# Patient Record
Sex: Female | Born: 1935 | Race: White | Hispanic: No | Smoking: Never smoker
Health system: Southern US, Community
[De-identification: ages and names within clinical notes are randomized; demographics above are authoritative.]

## PROBLEM LIST (undated history)

## (undated) DIAGNOSIS — I3139 Other pericardial effusion (noninflammatory): Secondary | ICD-10-CM

## (undated) DIAGNOSIS — D649 Anemia, unspecified: Secondary | ICD-10-CM

## (undated) DIAGNOSIS — M069 Rheumatoid arthritis, unspecified: Secondary | ICD-10-CM

## (undated) DIAGNOSIS — Z9289 Personal history of other medical treatment: Secondary | ICD-10-CM

## (undated) DIAGNOSIS — I509 Heart failure, unspecified: Secondary | ICD-10-CM

## (undated) DIAGNOSIS — J189 Pneumonia, unspecified organism: Secondary | ICD-10-CM

## (undated) DIAGNOSIS — Z86711 Personal history of pulmonary embolism: Secondary | ICD-10-CM

## (undated) DIAGNOSIS — K219 Gastro-esophageal reflux disease without esophagitis: Secondary | ICD-10-CM

## (undated) DIAGNOSIS — E039 Hypothyroidism, unspecified: Secondary | ICD-10-CM

## (undated) DIAGNOSIS — M109 Gout, unspecified: Secondary | ICD-10-CM

## (undated) DIAGNOSIS — N184 Chronic kidney disease, stage 4 (severe): Secondary | ICD-10-CM

## (undated) DIAGNOSIS — I10 Essential (primary) hypertension: Secondary | ICD-10-CM

## (undated) DIAGNOSIS — I313 Pericardial effusion (noninflammatory): Secondary | ICD-10-CM

## (undated) HISTORY — PX: APPENDECTOMY: SHX54

## (undated) HISTORY — PX: DILATION AND CURETTAGE OF UTERUS: SHX78

## (undated) HISTORY — PX: JOINT REPLACEMENT: SHX530

## (undated) HISTORY — DX: Essential (primary) hypertension: I10

## (undated) HISTORY — PX: ABDOMINAL HYSTERECTOMY: SHX81

## (undated) HISTORY — PX: COLONOSCOPY: SHX174

## (undated) HISTORY — PX: FRACTURE SURGERY: SHX138

## (undated) HISTORY — DX: Gout, unspecified: M10.9

## (undated) HISTORY — DX: Anemia, unspecified: D64.9

---

## 1993-11-30 HISTORY — PX: TOTAL HIP ARTHROPLASTY: SHX124

## 2000-01-07 ENCOUNTER — Ambulatory Visit (HOSPITAL_COMMUNITY): Admission: RE | Admit: 2000-01-07 | Discharge: 2000-01-07 | Payer: Self-pay | Admitting: *Deleted

## 2000-01-07 ENCOUNTER — Encounter (INDEPENDENT_AMBULATORY_CARE_PROVIDER_SITE_OTHER): Payer: Self-pay | Admitting: Specialist

## 2000-04-01 ENCOUNTER — Ambulatory Visit (HOSPITAL_COMMUNITY): Admission: RE | Admit: 2000-04-01 | Discharge: 2000-04-01 | Payer: Self-pay | Admitting: *Deleted

## 2000-04-05 ENCOUNTER — Encounter: Admission: RE | Admit: 2000-04-05 | Discharge: 2000-04-05 | Payer: Self-pay | Admitting: Obstetrics & Gynecology

## 2000-04-05 ENCOUNTER — Encounter: Payer: Self-pay | Admitting: Obstetrics & Gynecology

## 2001-04-11 ENCOUNTER — Encounter: Admission: RE | Admit: 2001-04-11 | Discharge: 2001-04-11 | Payer: Self-pay | Admitting: Obstetrics & Gynecology

## 2001-04-11 ENCOUNTER — Encounter: Payer: Self-pay | Admitting: Obstetrics & Gynecology

## 2002-05-01 ENCOUNTER — Encounter: Payer: Self-pay | Admitting: Family Medicine

## 2002-05-01 ENCOUNTER — Encounter: Admission: RE | Admit: 2002-05-01 | Discharge: 2002-05-01 | Payer: Self-pay | Admitting: Family Medicine

## 2003-02-24 ENCOUNTER — Encounter: Admission: RE | Admit: 2003-02-24 | Discharge: 2003-02-24 | Payer: Self-pay | Admitting: Internal Medicine

## 2003-02-24 ENCOUNTER — Encounter: Payer: Self-pay | Admitting: Internal Medicine

## 2003-05-07 ENCOUNTER — Encounter: Admission: RE | Admit: 2003-05-07 | Discharge: 2003-05-07 | Payer: Self-pay | Admitting: Internal Medicine

## 2003-05-07 ENCOUNTER — Encounter: Payer: Self-pay | Admitting: Internal Medicine

## 2003-06-20 ENCOUNTER — Encounter (INDEPENDENT_AMBULATORY_CARE_PROVIDER_SITE_OTHER): Payer: Self-pay | Admitting: Specialist

## 2003-06-20 ENCOUNTER — Ambulatory Visit (HOSPITAL_COMMUNITY): Admission: RE | Admit: 2003-06-20 | Discharge: 2003-06-20 | Payer: Self-pay | Admitting: *Deleted

## 2004-08-05 ENCOUNTER — Encounter: Admission: RE | Admit: 2004-08-05 | Discharge: 2004-08-05 | Payer: Self-pay | Admitting: Family Medicine

## 2006-09-17 ENCOUNTER — Encounter: Admission: RE | Admit: 2006-09-17 | Discharge: 2006-09-17 | Payer: Self-pay | Admitting: Specialist

## 2008-05-01 ENCOUNTER — Encounter (INDEPENDENT_AMBULATORY_CARE_PROVIDER_SITE_OTHER): Payer: Self-pay | Admitting: Interventional Radiology

## 2008-05-01 ENCOUNTER — Ambulatory Visit (HOSPITAL_COMMUNITY): Admission: RE | Admit: 2008-05-01 | Discharge: 2008-05-02 | Payer: Self-pay | Admitting: Nephrology

## 2008-05-29 ENCOUNTER — Encounter (INDEPENDENT_AMBULATORY_CARE_PROVIDER_SITE_OTHER): Payer: Self-pay | Admitting: Oncology

## 2008-05-29 ENCOUNTER — Other Ambulatory Visit: Admission: RE | Admit: 2008-05-29 | Discharge: 2008-05-29 | Payer: Self-pay | Admitting: Oncology

## 2009-06-11 ENCOUNTER — Ambulatory Visit: Payer: Self-pay | Admitting: Vascular Surgery

## 2009-06-27 ENCOUNTER — Ambulatory Visit (HOSPITAL_COMMUNITY): Admission: RE | Admit: 2009-06-27 | Discharge: 2009-06-27 | Payer: Self-pay | Admitting: Vascular Surgery

## 2009-06-27 ENCOUNTER — Ambulatory Visit: Payer: Self-pay | Admitting: Vascular Surgery

## 2009-06-27 HISTORY — PX: AV FISTULA PLACEMENT: SHX1204

## 2009-08-13 ENCOUNTER — Ambulatory Visit: Payer: Self-pay | Admitting: Vascular Surgery

## 2010-11-30 HISTORY — PX: CATARACT EXTRACTION W/ INTRAOCULAR LENS  IMPLANT, BILATERAL: SHX1307

## 2010-12-21 ENCOUNTER — Encounter: Payer: Self-pay | Admitting: Nephrology

## 2010-12-21 ENCOUNTER — Encounter: Payer: Self-pay | Admitting: Family Medicine

## 2011-03-08 LAB — POCT I-STAT 4, (NA,K, GLUC, HGB,HCT)
Glucose, Bld: 91 mg/dL (ref 70–99)
HCT: 36 % (ref 36.0–46.0)

## 2011-04-14 NOTE — Assessment & Plan Note (Signed)
OFFICE VISIT   Kerkman, Yachet R  DOB:  [birth year]                                       08/13/2009  EAVWU#:[id number]   The patient returns for initial followup regarding her AV fistula in the  right upper arm which I created on 07/29.  She has a Building services engineer upper arm  fistula which has an excellent pulse and palpable thrill and no evidence  of steal distally.  This should be an excellent access for dialysis if  this becomes necessary since she has never had dialysis in the past.  She will return to see [place] on a p.r.n. basis.   Quita Skye Hart Rochester, M.D.  Electronically Signed   JDL/MEDQ  D:  08/13/2009  T:  08/14/2009  Job:  [id number]   cc:   Aram Beecham B. Eliott Nine, M.D.

## 2011-04-14 NOTE — Discharge Summary (Signed)
NAME:  Rachel Johns, Rachel Johns                  ACCOUNT NO.:  [id number]   MEDICAL RECORD NO.:  [id number]          PATIENT TYPE:  OIB   LOCATION:  6704                         FACILITY:  [facility]   PHYSICIAN:  Aram Beecham B. Eliott Nine, M.D.DATE OF BIRTH:  Nov 01, 1934   DATE OF ADMISSION:  05/01/2008  DATE OF DISCHARGE:                               DISCHARGE SUMMARY   ADMISSION DIAGNOSES:  1. Nephrotic-range proteinuria with active urine sediment and renal      insufficiency.  2. Anemia.  3. Thrombocytopenia.  4. Rheumatoid arthritis on methotrexate.  5. Gout.  6. Hypothyroidism, replaced.  7. Hypertension.   DISCHARGE DIAGNOSES:  1. Nephrotic-range proteinuria with active urine sediment and renal      insufficiency.  2. Anemia.  3. Thrombocytopenia.  4. Rheumatoid arthritis on methotrexate.  5. Gout.  6. Hypothyroidism, replaced.  7. Hypertension.   OPERATIONS AND PROCEDURES:  Left percutaneous renal biopsy by  Interventional Radiology.   BRIEF HISTORY:  This is a 77 year old white female with a history of  seropositive rheumatoid arthritis,  hypertension, and gout, newly  diagnosed. __________ admitted for __________ with prior negative  __________ .   Past medical history, family and social history, review of systems all  as dictated in admission.   During admission, physical was notable postbiopsy for a blood pressure  180/100, she was afebrile.  Lungs, clear to auscultation.  Normal  cardiac __________ .  Gauze noted over her percutaneous biopsy site.  __________   PERTINENT LABS:  Prior to the biopsy included a hemoglobin of 9.5 and  platelets 95,000.  Normal PT/PTT and bleeding time. Serum creatinine of  1.57.  A 24-hour urine protein was 4.6 g.   HOSPITAL COURSE:  The patient was admitted for observation following the  left percutaneous renal biopsy by Interventional Radiology.  She  experienced no issues with __________.  Hemoglobin prebiopsy was 9.5.  Serial hemoglobin  confirmation postbiopsy was 9.5 and 9.6 and  with  stability in hemoglobin, the patient is to be discharged today, first  day post biopsy, and will see her for followup in my office in a week to  10 days.   She will continue her usual medications with the addition of clonidine,  which was added during hospitalization for hypertension.  Her low  platelet count, which ranged between 77,000 and 85,000 will be addressed  by her rheumatologist, Dr. Jimmy Footman (query overdose of methotrexate).  __________   The patient was discharged in stable condition.   DISCHARGE INSTRUCTIONS:  1. Continue all home medications and in addition start clonidine 0.1      mg per daily for hypertension.  2. Call, if severe back or flank pain, fever greater than 101, or      grossly bloody urine.  3. No aspirin or aspirin-containing products, Motrin, Advil, Aleve,      etc., for 2 weeks.  4. No heavy lifting over 10 pounds for 2 weeks.  5. CBC will be arranged for 1 week at [facility] and followup      appointment with Dr. Eliott Nine in about 10 days  or so when the final      biopsy results are back.           ______________________________  Duke Salvia. Eliott Nine, M.D.     CBD/MEDQ  D:  05/02/2008  T:  05/02/2008  Job:  [id number]   cc:   Desmond Dike, MD  [place]in[place] Kirby Funk, MD  Lemmie Evens, M.D.

## 2011-04-14 NOTE — Op Note (Signed)
NAME:  Rachel Johns, Rachel Johns                  ACCOUNT NO.:  [id number]   MEDICAL RECORD NO.:  [id number]          PATIENT TYPE:  AMB   LOCATION:  SDS                          FACILITY:  [facility]   PHYSICIAN:  Quita Skye. Hart Rochester, M.D.  DATE OF BIRTH:  November 01, 1934   DATE OF PROCEDURE:  06/27/2009  DATE OF DISCHARGE:  06/27/2009                               OPERATIVE REPORT   PREOPERATIVE DIAGNOSIS:  End-stage renal disease.   POSTOPERATIVE DIAGNOSIS:  End-stage renal disease.   OPERATION:  Creation of a right upper arm brachial artery to cephalic  vein arteriovenous fistula (Kauffman shunt).   SURGEON:  Quita Skye. Hart Rochester, MD   FIRST ASSISTANT:  Della Goo, PA-C   ANESTHESIA:  Local.   PROCEDURE:  The patient was taken to the operating room and placed in  the supine position at which time right upper extremity was prepped with  Betadine scrub and solution and draped in routine sterile manner.  After  infiltration of 1% Xylocaine with epinephrine, transverse incision was  made in the antecubital area, antecubital vein was dissected free.  Cephalic branch was an excellent vein being about 3.5 mm in size.  It  was ligated distally and transected.  Artery was then exposed beneath  the fascia and encircled with vessel loops.  It had an excellent pulse.  The artery was occluded proximally and distally, opened with 15 blade,  and extended with Potts scissors.  The vein was carefully measured and  spatulated and anastomosed end-to-side with 6-0 Prolene.  Following  this, vessel loops were released.  There was an excellent pulse and  palpable thrill up to the proximal upper arm.  There was also a palpable  radial pulse with the fistula open.  Adequate hemostasis was achieved,  and the wound was closed in layers with Vicryl in a subcuticular  fashion.  Sterile dressing applied.  The patient was taken to recovery  room in satisfactory condition.      Quita Skye Hart Rochester, M.D.  Electronically  Signed     JDL/MEDQ  D:  06/27/2009  T:  06/27/2009  Job:  [id number]

## 2011-04-14 NOTE — Consult Note (Signed)
NEW PATIENT CONSULTATION   Paye, Amilah R  DOB:  09-01-1934                                       06/11/2009  NFAOZ#:[id number]   The patient is a 77 year old female with chronic renal insufficiency  secondary to sclerosing IgA nephropathy.  She has never been on dialysis  in the past but is being evaluated today for vascular access for  hemodialysis in the future.  She does have hypertension as well as  anemia and gout and rheumatoid arthritis by history.  She is right-  handed.  She does not take any anticoagulants.   On exam today her blood pressure is 154/84, heart rate is 80,  respirations 14.  Upper extremity exam reveals 3+ brachial and radial  pulses bilaterally.  She does have easy bruisability.  Cephalic vein on  the right is larger than the left by vein mapping and this was confirmed  with a SonoSite exam by me at the bedside.  Her forearm vein is  borderline.   I think the best plan would be a right upper arm AV fistula Soyla Murphy).  I have scheduled that for Thursday, July 29 at [facility] as an  outpatient.  Hopefully this will provide a satisfactory site for  vascular access for this nice lady.   Quita Skye Hart Rochester, M.D.  Electronically Signed   JDL/MEDQ  D:  06/11/2009  T:  06/12/2009  Job:  [id number]   cc:   Aram Beecham B. Eliott Nine, M.D.

## 2011-04-14 NOTE — H&P (Signed)
NAME:  Rachel Johns, Rachel Johns                  ACCOUNT NO.:  [id number]   MEDICAL RECORD NO.:  [id number]          PATIENT TYPE:  OIB   LOCATION:  6704                         FACILITY:  [facility]   PHYSICIAN:  Aram Beecham B. Eliott Nine, M.D.DATE OF BIRTH:  Sep 18, 1934   DATE OF ADMISSION:  05/01/2008  DATE OF DISCHARGE:                              HISTORY & PHYSICAL   Rachel Johns is a 77 year old white female, who has a long history of  seropositive rheumatoid arthritis (followed by Rachel Johns),  longstanding hypertension, newly diagnosed gout, hypothyroidism, and  anemia.  Currently on Aranesp (followed by Rachel Johns in  [place] for this).  Rachel was referred by Rachel primary physician Dr.  Sheria Johns, who noted an increase in serum creatinine from 1.4-1.7, which  occurred over the period of time since May 2007.  I saw Rachel on Apr 02, 2008, in the [place] office.  Rachel had a urinalysis, which showed 3+  protein with 40-50 RBCs.  24-hour urine protein was 4.6 g per 24 hours.  ANA, ANCA, hepatitis B and C, cryoglobulins, SPEP and UPEP were all  negative.  The patient was admitted for renal biopsy, which was done  earlier today by Interventional Radiology, to be monitored post biopsy.   Rachel has had issues since Rachel initial assessment in May with  hyperuricemia and the gouty arthritis involving Rachel right foot.  Dr.  Jimmy Johns has managed this with parenteral Kenalog as well as a trial of  oral prednisone, which Rachel stopped after the third day because of nausea  and vomiting.  Rachel is not on colchicine and has not yet started Uloric,  although this is the eventual plan to be done as an outpatient once Rachel  acute flare subsides.   PAST MEDICAL HISTORY:  1. Rheumatoid arthritis, on weekly oral methotrexate.  2. Gout with hyperuricemia, not yet on therapy.  3. Hypertension, longstanding.  4. Anemia, on Aranesp per Rachel Johns in [place].  5. Hypothyroidism, on replacement.  6. History of multinodular  goiter.  7. Pernicious anemia, on chronic B12 therapy.  8. Vitamin D deficiency.  9. History of cataracts.  10.Remote history of pulmonary embolus, possibly related to oral      contraceptive agents.  11.History of Barrett esophagus.  12.Allergic rhinitis.  13.History of mild spinal stenosis.  14.Total hip replacement, 1995.  15.History of chest discomfort with negative cardiac stress test in      2004 and 2009.  16.Status post hysterectomy in the past.  17.Small bilateral cataracts without substantial visual impairment.   CURRENT MEDICATIONS:  1. Alendronate 70 mg once a week.  2. Levothyroxine 75 mcg once a day.  3. Caltrate with vitamin D once a day.  4. Fexofenadine 180 mg per day.  5. Methotrexate 2.5 mg four tablets every Sunday.  6. Prilosec 20 mg daily.  7. Folic acid 1 mg daily.  8. Avapro 150 mg daily.  9. Iron once a day.  10.Oxycodone/APAP 5/325 mg one-half tablet as needed.   FAMILY HISTORY:  Positive for cardiac disease - Rachel Johns died of an MI in  his 17s, also positive history for hypertension and Rachel Johns,  who had lung cancer.   SOCIAL HISTORY:  The patient has been widowed for 4 years.  Rachel was  married prior to that for 47-1/2 years.  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  Rachel  has one Johns, who is very supportive, who lives near Rachel.  Rachel does not  smoke cigarettes and since Rachel has been taking methotrexate for Rachel  rheumatoid disease, has not used any alcohol, but occasionally prior to  that would enjoy a glass of wine.   REVIEW OF SYSTEMS:  Positive for gout pain, involving Rachel right foot for  about 2 weeks.  Rachel developed occasional shortness of breath with  exertion.  Rachel admits to nausea and vomiting with Rachel recent trial of  prednisone for gout.  Rachel denies chest pain, abdominal pain, and  swelling.  Currently, post biopsy, Rachel is not having any back pain, or  flank pain.  No  dizziness or lightheadedness and Rachel has not had any  rash or neurologic symptoms.   PHYSICAL EXAMINATION:  GENERAL:  (seen post percutaneous biopsy) reveals  a very pleasant older white female, in no acute distress.  VITAL SIGNS:  Blood pressure supine was 180/100.  Rachel was afebrile.  HEENT:  Normocephalic.  No evidence for trauma.  No neck vein  distention.  LUNGS:  Clear to auscultation and percussion.  CARDIAC:  Rhythm was regular.  S1, S2.  No audible S3 and no murmur.  Rachel has a Band-Aid on the left flank at Rachel biopsy site with a small  bruise, but no large hematoma.  ABDOMEN:  Soft and nontender.  There is no edema of the lower  extremities.  Rachel had pain with range of motion in Rachel right foot, but  the foot itself did not look hot or red, although there was some  tenderness.   PRE-BIOPSY LABS:  PT and PTT normal.  Creatinine 1.57.  Hemoglobin 10.6  (Apr 02, 2008), hematocrit 31.5, 24-hour urine protein 4.67 g/24 hours.  SPEP and UPEP negative.  ANA, ANCA, and cryoglobulins, all negative.   IMPRESSION:  77 year old female with longstanding history of rheumatoid  disease, gout, and hypertension who has;  1. Chronic kidney disease, stage III with some progression, nephrotic      range proteinuria and a nephritic sediment who is admitted status      post percutaneous renal biopsy for diagnostic purposes.  We will      follow up on a CBC this evening and in the morning.  If there is no      significant change in Rachel hemoglobin, Rachel can be discharged      tomorrow after lunch.  Blood pressure is elevated and Rachel is on      Avapro, which we will continue and will also use p.r.n. clonidine      to try and keep pressures down post biopsy to reduce the risk of      bleeding.  If Rachel blood pressure does respond, we will discharge      home on clonidine, but will assess dosing over the next 18 hours.      If Rachel has no further issues then Rachel will need a CBC in a week and      follow up  with me in about 10 days.  2. Hypertension.  As discussed above.  3. Rheumatoid arthritis  and weekly methotrexate.  4. Hyperuricemia with gout.  We will start Uloric as an outpatient      along with some colchicine, but until Rachel is stable post biopsy.      We will not add any new medications.  5. Anemia, on Aranesp per Dr. Melvyn Neth.  6. Hypothyroidism, on replacement per Rachel Johns. please carbon copy      this to Dr. Desmond Dike and to Dr. Jonny Ruiz and some AST as well as to      our office [facility] after review of signature.           ______________________________  Duke Salvia Eliott Nine, M.D.     CBD/MEDQ  D:  05/01/2008  T:  05/02/2008  Job:  [id number]   cc:   Elicia Lamp, MD  Lemmie Evens, M.D.  [facility]

## 2011-04-14 NOTE — Procedures (Signed)
CEPHALIC VEIN MAPPING   INDICATION:  End stage renal disease.   HISTORY:  End stage renal disease   EXAM:   The right cephalic vein is compressible.   Diameter measurements range from 0.27 to 0.43.   The left cephalic vein  is  compressible.   Diameter measurements range from 0.23 to 0.25.   See attached worksheet for all measurements.   IMPRESSION:  Patent with bilateral cephalic veins which are of  acceptable diameter for use as a dialysis access site.   ___________________________________________  Quita Skye. Hart Rochester, M.D.   MG/MEDQ  D:  06/11/2009  T:  06/11/2009  Job:  [id number]

## 2011-04-17 NOTE — Procedures (Signed)
[facility]  Patient:    Rachel Johns, Rachel Johns                         MRN: [id number] Proc. Date: 04/01/00 Adm. Date:  [id number] Disc. Date: [id number] Attending:  Sabino Gasser CC:         Desmond Dike, M.D. - [address], [place], [place] [address]                           Procedure Report  PROCEDURE:  Flexible sigmoidoscopy.  ENDOSCOPIST:  Sabino Gasser, M.D.  INDICATIONS:  Colon cancer screening.  ANESTHESIA:  Additional Demerol 10 mg, Versed 1.5 mg given intravenously in divided dose.  DESCRIPTION OF PROCEDURE:  With the patient mildly sedated in the left lateral decubitus position, the Olympus videoscopic colonoscope was inserted in the rectum and passed under direct vision to 60.0 cm from the anal verge, at which point the prep became suboptimal, and therefore from this point the colonoscope was then slowly withdrawn, taking circumferential views of the entire colonic mucosa, visualized, photographing as we moved to the rectum, which appeared normal on direct and retroflexed view.  The endoscope was then straightened and withdrawn. The patients vital signs and pulse oximeter remained stable.  The patient tolerated the procedure well without apparent complications.  FINDINGS:  Essentially normal flexible sigmoidoscopy.  PLAN:  Will have the patient follow up with me as noted above in the endoscopy note, for a followup of her reflux changes. DD:  04/01/00 TD:  04/03/00 Job: [id number]

## 2011-04-17 NOTE — Procedures (Signed)
[facility]  Patient:    Rachel Johns, Rachel Johns                           MRN: [id number] Attending:  Sabino Gasser, M.D. CC:         Desmond Dike, M.D., [address] [place], [place]. [address]                           Procedure Report  PROCEDURE:  Upper endoscopy with biopsy.  INDICATION FOR PROCEDURE:  Atypical cells seen on biopsy three months ago. The patient now on daily Prevacid therapy.  ANESTHESIA:  Demerol 60 mg, versed 7 mg were given intravenously in divided dose.  DESCRIPTION OF PROCEDURE:  With the patient mildly sedated in the left lateral decubitus position, the Olympus videoscopic endoscope was inserted in the mouth and passed under direct vision through the esophagus. The distal esophagus was approached and again showed changes of reflux and/or Barrettes esophagus. This as photographed. There may have been in comparison to the previous endoscopy less erythema and less inflammatory changes and multiple biopsies where then again taken. Once accomplished, we advanced into the stomach which other than the hiatal hernia seen was unremarkable. We advanced to the antrum, duodenal bulb, and second portion of the duodenum all of which appeared normal. From this point, the endoscope was slowly withdrawn taking circumferential views of the entire duodenal mucosa after photographs were taken until the endoscope was pulled back into the stomach, placed in retroflexion to view the stomach from below and this was photographed as well. The endoscope was then straightened and pulled back from distal to proximal stomach taking circumferential views of the entire gastric antrum subsequently esophageal mucosa all of which appeared normal other than the changes of the distal esophagus. The patients vital signs and pulse oximeter remained stable. The patient tolerated the procedure well without apparent complications.  FINDINGS:  Changes of moderately severe  esophagitis above a hiatal hernia. Await the biopsy report. The patient will call me for results and follow-up with me as an outpatient. Proceed with flexible sigmoidoscopy as planned for routine colon cancer screening. Her stool Hemoccults were negative x 3. DD:  04/01/00 TD:  04/03/00 Job: [id number]

## 2011-04-17 NOTE — Op Note (Signed)
   NAME:  Rachel Johns, Rachel Johns                            ACCOUNT NO.:  [id number]   MEDICAL RECORD NO.:  [id number]                   PATIENT TYPE:  AMB   LOCATION:  ENDO                                 [facility]AC[facility]  [facility]   PHYSICIAN:  Georgiana Spinner, M.D.                 DATE OF BIRTH:  1933-12-14   DATE OF PROCEDURE:  DATE OF DISCHARGE:                                 OPERATIVE REPORT   PROCEDURE:  Colonoscopy.   INDICATIONS:  Colon cancer screening.   ANESTHESIA:  Demerol 100 mg, Versed 8 mg.   PROCEDURE:  With the patient mildly sedated in the left lateral decubitus  position, the Olympus videoscopic colonoscope was inserted into the rectum  and passed under direct vision to the cecum through a tortuous colon.  Cecum  identified by ileocecal valve and appendiceal orifice, both of which were  photographed.  From this point the colonoscope was slowly withdrawn, taking  circumferential views of the colonic mucosa visualized, stopping only to  take random biopsies along the way, appeared to be normal, possibly polypoid-  type tissue, until we reached the rectum, which appeared normal on direct  and retroflexed view.  The endoscope was straightened and withdrawn.  The  patient's vital signs and pulse oximetry remained stable.  The patient  tolerated the procedure well and without apparent complications.   FINDINGS:  Possibly internal hemorrhoids, otherwise an unremarkable  examination.   PLAN:  Await biopsy report.  The patient will call me for results and follow  up with me as an outpatient.                                               Georgiana Spinner, M.D.    GMO/MEDQ  D:  06/20/2003  T:  06/20/2003  Job:  [id number]

## 2011-08-27 LAB — HEPATIC FUNCTION PANEL
ALT: 9
Albumin: 2.8 — ABNORMAL LOW
Alkaline Phosphatase: 60
Total Bilirubin: 0.8
Total Protein: 4.8 — ABNORMAL LOW

## 2011-08-27 LAB — CBC
HCT: 27.8 — ABNORMAL LOW
Hemoglobin: 9.1 — ABNORMAL LOW
Hemoglobin: 9.5 — ABNORMAL LOW
Hemoglobin: 9.5 — ABNORMAL LOW
MCHC: 34.1
MCHC: 34.6
MCV: 84
Platelets: 77 — ABNORMAL LOW
RBC: 3.31 — ABNORMAL LOW
RBC: 3.36 — ABNORMAL LOW
RDW: 15.3
RDW: 16.3 — ABNORMAL HIGH
WBC: 7.8

## 2011-08-27 LAB — DIFFERENTIAL
Basophils Absolute: 0
Basophils Relative: 1
Basophils Relative: 1
Eosinophils Absolute: 0.2
Eosinophils Relative: 3
Monocytes Absolute: 0.3
Monocytes Absolute: 0.4
Monocytes Relative: 6
Neutro Abs: 3.5
Neutrophils Relative %: 68

## 2012-11-04 ENCOUNTER — Telehealth: Payer: Self-pay

## 2012-11-04 NOTE — Telephone Encounter (Signed)
Pt. Called to report working in her yard yesterday and "over-did it".  States she started having discomfort, swelling, and redness in right arm at fistula site.  Stated she went to her PCP, he recommended an antibiotic, and warm compresses.  Advised pt. To be evaluated at [facility].  States has never been on HD/ reports she has had improvement in her creatinine. (last rdg. 1.7)   Does state her arm feels better today.  Discussed w/ Dr. Imogene Burn.  Recommends an appt. To evaluate the AVF site.  Stated no vascular studies needed initially.

## 2012-11-07 ENCOUNTER — Encounter: Payer: Self-pay | Admitting: Vascular Surgery

## 2012-11-08 ENCOUNTER — Encounter: Payer: Self-pay | Admitting: Vascular Surgery

## 2012-11-08 ENCOUNTER — Ambulatory Visit (INDEPENDENT_AMBULATORY_CARE_PROVIDER_SITE_OTHER): Payer: Medicare Other | Admitting: Vascular Surgery

## 2012-11-08 VITALS — BP 138/69 | HR 77 | Temp 98.3°F | Resp 16 | Ht 67.0 in | Wt 150.0 lb

## 2012-11-08 DIAGNOSIS — N184 Chronic kidney disease, stage 4 (severe): Secondary | ICD-10-CM | POA: Insufficient documentation

## 2012-11-08 DIAGNOSIS — T82898A Other specified complication of vascular prosthetic devices, implants and grafts, initial encounter: Secondary | ICD-10-CM

## 2012-11-08 NOTE — Progress Notes (Signed)
Subjective:     Patient ID: Rachel Johns, female   DOB: [birth year], 78 y.o.   MRN: [id number]  HPI this 78 year old female returns for evaluation of a right brachial-cephalic AV fistula which I created over 3 years ago. She has a sclerosing IgA nephropathy followed by Dr. Camille Bal. Her renal function has plateaued and remained quite stable with most recent creatinine of 1.8. Unfortunately she thrombosed her fistula at least one week ago. She was started on some antibiotics by her medical doctor because of some erythema surrounding this. She had had no chills and fever. She now returns for evaluation of the fistula. She denies any pain or numbness in the right hand and continues to have no chills and fever and denies pain in the fistula. The fistula was never used.  Past Medical History  Diagnosis Date  . Chronic kidney disease   . Hypertension   . Anemia   . Arthritis   . Gout     History  Substance Use Topics  . Smoking status: Never Smoker   . Smokeless tobacco: Never Used  . Alcohol Use: No    Family History  Problem Relation Age of Onset  . Heart disease Mother   . Heart attack Father   . Cancer Sister   . Other Sister     varicose veins  . Diabetes Brother   . Heart attack Daughter     No Known Allergies  Current outpatient prescriptions:acetaminophen (TYLENOL) 500 MG tablet, Take 500 mg by mouth every 6 (six) hours as needed., Disp: , Rfl: ;  allopurinol (ZYLOPRIM) 100 MG tablet, Take 1 tablet by mouth daily., Disp: , Rfl: ;  amLODipine (NORVASC) 5 MG tablet, Take 1 tablet by mouth daily., Disp: , Rfl: ;  calcitRIOL (ROCALTROL) 0.25 MCG capsule, Take 1 capsule by mouth 2 (two) times a week. Monday and Friday, Disp: , Rfl:  cholecalciferol (VITAMIN D) 1000 UNITS tablet, Take 1,000 Units by mouth daily., Disp: , Rfl: ;  cloNIDine (CATAPRES) 0.2 MG tablet, Take 1 tablet by mouth 2 (two) times daily., Disp: , Rfl: ;  folic acid (FOLVITE) 1 MG tablet, Take 1 mg by mouth  daily., Disp: , Rfl: ;  furosemide (LASIX) 40 MG tablet, Take 1 tablet by mouth daily., Disp: , Rfl:  hydroxychloroquine (PLAQUENIL) 200 MG tablet, Take 1 tablet by mouth 2 (two) times daily., Disp: , Rfl: ;  Lansoprazole (PREVACID PO), Take 15 mg by mouth daily., Disp: , Rfl: ;  levothyroxine (SYNTHROID, LEVOTHROID) 75 MCG tablet, Take 1 tablet by mouth daily., Disp: , Rfl: ;  losartan (COZAAR) 50 MG tablet, Take 1 tablet by mouth daily., Disp: , Rfl: ;  methylPREDNISolone (MEDROL) 4 MG tablet, Take 1 tablet by mouth daily., Disp: , Rfl:  sodium bicarbonate 650 MG tablet, Take 650 mg by mouth 3 (three) times daily., Disp: , Rfl: ;  traMADol (ULTRAM) 50 MG tablet, Take 50 mg by mouth every 6 (six) hours as needed., Disp: , Rfl:   BP 138/69  Pulse 77  Temp 98.3 F (36.8 C) (Oral)  Resp 16  Ht 5\' 7"  (1.702 m)  Wt 150 lb (68.04 kg)  BMI 23.49 kg/m2  SpO2 100%  Body mass index is 23.49 kg/(m^2).           Review of Systems denies chest pain, dyspnea on exertion, PND, orthopnea, claudication. All systems negative and complete review of system     Objective:   Physical Exam blood pressure 130/69 heart  rate 77 respirations 18 temperature 98.3 General well-developed well-nourished female in no apparent stress alert and oriented x3 Lungs no rhonchi or wheezing Right upper extremity with thrombosed brachial-cephalic AV fistula. 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 This is firm and thrombosed. There is no erythema surrounding it no fluctuance. The remainder of the vein is patent. 3+ radial pulse palpable distally. Right hand is warm and well perfused.     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     Plan:     Discussed situation with Dr. Eliott Nine. We do not feel replacing this portion of the fistula with a Gore-Tex graft would be in her best interest with her and no compromised state and the fact that she has not required dialysis.  Will not proceed with any further access at this time unless patient requires this in the future If thrombosed aneurysm the fistula becomes red and tender will perform surgical procedure to evacuate this thrombus. Offered this the patient currently but she does not want to proceed at the present time. Return to see [place] on when necessary basis

## 2014-12-25 DIAGNOSIS — Z961 Presence of intraocular lens: Secondary | ICD-10-CM | POA: Diagnosis not present

## 2015-01-02 DIAGNOSIS — D631 Anemia in chronic kidney disease: Secondary | ICD-10-CM | POA: Diagnosis not present

## 2015-01-02 DIAGNOSIS — N189 Chronic kidney disease, unspecified: Secondary | ICD-10-CM | POA: Diagnosis not present

## 2015-02-01 DIAGNOSIS — E213 Hyperparathyroidism, unspecified: Secondary | ICD-10-CM | POA: Diagnosis not present

## 2015-02-01 DIAGNOSIS — B9689 Other specified bacterial agents as the cause of diseases classified elsewhere: Secondary | ICD-10-CM | POA: Diagnosis not present

## 2015-02-01 DIAGNOSIS — K2270 Barrett's esophagus without dysplasia: Secondary | ICD-10-CM | POA: Diagnosis not present

## 2015-02-01 DIAGNOSIS — J019 Acute sinusitis, unspecified: Secondary | ICD-10-CM | POA: Diagnosis not present

## 2015-02-01 DIAGNOSIS — D510 Vitamin B12 deficiency anemia due to intrinsic factor deficiency: Secondary | ICD-10-CM | POA: Diagnosis not present

## 2015-02-13 DIAGNOSIS — D631 Anemia in chronic kidney disease: Secondary | ICD-10-CM | POA: Diagnosis not present

## 2015-02-13 DIAGNOSIS — N189 Chronic kidney disease, unspecified: Secondary | ICD-10-CM | POA: Diagnosis not present

## 2015-02-19 DIAGNOSIS — M0589 Other rheumatoid arthritis with rheumatoid factor of multiple sites: Secondary | ICD-10-CM | POA: Diagnosis not present

## 2015-02-19 DIAGNOSIS — M150 Primary generalized (osteo)arthritis: Secondary | ICD-10-CM | POA: Diagnosis not present

## 2015-02-19 DIAGNOSIS — M1A09X0 Idiopathic chronic gout, multiple sites, without tophus (tophi): Secondary | ICD-10-CM | POA: Diagnosis not present

## 2015-02-19 DIAGNOSIS — M810 Age-related osteoporosis without current pathological fracture: Secondary | ICD-10-CM | POA: Diagnosis not present

## 2015-03-07 DIAGNOSIS — Z1231 Encounter for screening mammogram for malignant neoplasm of breast: Secondary | ICD-10-CM | POA: Diagnosis not present

## 2015-03-27 DIAGNOSIS — D631 Anemia in chronic kidney disease: Secondary | ICD-10-CM | POA: Diagnosis not present

## 2015-03-27 DIAGNOSIS — N189 Chronic kidney disease, unspecified: Secondary | ICD-10-CM | POA: Diagnosis not present

## 2015-04-25 DIAGNOSIS — D631 Anemia in chronic kidney disease: Secondary | ICD-10-CM | POA: Diagnosis not present

## 2015-04-25 DIAGNOSIS — N189 Chronic kidney disease, unspecified: Secondary | ICD-10-CM | POA: Diagnosis not present

## 2015-05-08 DIAGNOSIS — M109 Gout, unspecified: Secondary | ICD-10-CM | POA: Diagnosis not present

## 2015-05-08 DIAGNOSIS — N184 Chronic kidney disease, stage 4 (severe): Secondary | ICD-10-CM | POA: Diagnosis not present

## 2015-05-08 DIAGNOSIS — D631 Anemia in chronic kidney disease: Secondary | ICD-10-CM | POA: Diagnosis not present

## 2015-05-08 DIAGNOSIS — N028 Recurrent and persistent hematuria with other morphologic changes: Secondary | ICD-10-CM | POA: Diagnosis not present

## 2015-05-08 DIAGNOSIS — N2581 Secondary hyperparathyroidism of renal origin: Secondary | ICD-10-CM | POA: Diagnosis not present

## 2015-05-08 DIAGNOSIS — I129 Hypertensive chronic kidney disease with stage 1 through stage 4 chronic kidney disease, or unspecified chronic kidney disease: Secondary | ICD-10-CM | POA: Diagnosis not present

## 2015-05-27 DIAGNOSIS — N189 Chronic kidney disease, unspecified: Secondary | ICD-10-CM | POA: Diagnosis not present

## 2015-05-27 DIAGNOSIS — D631 Anemia in chronic kidney disease: Secondary | ICD-10-CM | POA: Diagnosis not present

## 2015-06-18 DIAGNOSIS — M0589 Other rheumatoid arthritis with rheumatoid factor of multiple sites: Secondary | ICD-10-CM | POA: Diagnosis not present

## 2015-06-18 DIAGNOSIS — M1A09X0 Idiopathic chronic gout, multiple sites, without tophus (tophi): Secondary | ICD-10-CM | POA: Diagnosis not present

## 2015-06-18 DIAGNOSIS — M150 Primary generalized (osteo)arthritis: Secondary | ICD-10-CM | POA: Diagnosis not present

## 2015-06-18 DIAGNOSIS — M810 Age-related osteoporosis without current pathological fracture: Secondary | ICD-10-CM | POA: Diagnosis not present

## 2015-06-26 DIAGNOSIS — N189 Chronic kidney disease, unspecified: Secondary | ICD-10-CM | POA: Diagnosis not present

## 2015-06-26 DIAGNOSIS — D631 Anemia in chronic kidney disease: Secondary | ICD-10-CM | POA: Diagnosis not present

## 2015-06-28 DIAGNOSIS — D631 Anemia in chronic kidney disease: Secondary | ICD-10-CM | POA: Diagnosis not present

## 2015-06-28 DIAGNOSIS — N189 Chronic kidney disease, unspecified: Secondary | ICD-10-CM | POA: Diagnosis not present

## 2015-07-29 DIAGNOSIS — N189 Chronic kidney disease, unspecified: Secondary | ICD-10-CM | POA: Diagnosis not present

## 2015-07-29 DIAGNOSIS — D631 Anemia in chronic kidney disease: Secondary | ICD-10-CM | POA: Diagnosis not present

## 2015-08-07 DIAGNOSIS — D631 Anemia in chronic kidney disease: Secondary | ICD-10-CM | POA: Diagnosis not present

## 2015-08-07 DIAGNOSIS — N184 Chronic kidney disease, stage 4 (severe): Secondary | ICD-10-CM | POA: Diagnosis not present

## 2015-08-07 DIAGNOSIS — M109 Gout, unspecified: Secondary | ICD-10-CM | POA: Diagnosis not present

## 2015-08-07 DIAGNOSIS — N2581 Secondary hyperparathyroidism of renal origin: Secondary | ICD-10-CM | POA: Diagnosis not present

## 2015-08-07 DIAGNOSIS — I129 Hypertensive chronic kidney disease with stage 1 through stage 4 chronic kidney disease, or unspecified chronic kidney disease: Secondary | ICD-10-CM | POA: Diagnosis not present

## 2015-08-27 DIAGNOSIS — D631 Anemia in chronic kidney disease: Secondary | ICD-10-CM | POA: Diagnosis not present

## 2015-08-27 DIAGNOSIS — N189 Chronic kidney disease, unspecified: Secondary | ICD-10-CM | POA: Diagnosis not present

## 2015-09-16 DIAGNOSIS — Z23 Encounter for immunization: Secondary | ICD-10-CM | POA: Diagnosis not present

## 2015-09-16 DIAGNOSIS — N028 Recurrent and persistent hematuria with other morphologic changes: Secondary | ICD-10-CM | POA: Diagnosis not present

## 2015-09-16 DIAGNOSIS — N184 Chronic kidney disease, stage 4 (severe): Secondary | ICD-10-CM | POA: Diagnosis not present

## 2015-09-16 DIAGNOSIS — K2270 Barrett's esophagus without dysplasia: Secondary | ICD-10-CM | POA: Diagnosis not present

## 2015-09-16 DIAGNOSIS — E039 Hypothyroidism, unspecified: Secondary | ICD-10-CM | POA: Diagnosis not present

## 2015-09-16 DIAGNOSIS — Z0000 Encounter for general adult medical examination without abnormal findings: Secondary | ICD-10-CM | POA: Diagnosis not present

## 2015-09-26 DIAGNOSIS — N189 Chronic kidney disease, unspecified: Secondary | ICD-10-CM | POA: Diagnosis not present

## 2015-09-26 DIAGNOSIS — D631 Anemia in chronic kidney disease: Secondary | ICD-10-CM | POA: Diagnosis not present

## 2015-10-10 DIAGNOSIS — D649 Anemia, unspecified: Secondary | ICD-10-CM | POA: Diagnosis not present

## 2015-10-10 DIAGNOSIS — R140 Abdominal distension (gaseous): Secondary | ICD-10-CM | POA: Diagnosis not present

## 2015-10-10 DIAGNOSIS — M069 Rheumatoid arthritis, unspecified: Secondary | ICD-10-CM | POA: Diagnosis not present

## 2015-10-28 DIAGNOSIS — D631 Anemia in chronic kidney disease: Secondary | ICD-10-CM | POA: Diagnosis not present

## 2015-10-28 DIAGNOSIS — N189 Chronic kidney disease, unspecified: Secondary | ICD-10-CM | POA: Diagnosis not present

## 2015-11-11 DIAGNOSIS — M109 Gout, unspecified: Secondary | ICD-10-CM | POA: Diagnosis not present

## 2015-11-11 DIAGNOSIS — D631 Anemia in chronic kidney disease: Secondary | ICD-10-CM | POA: Diagnosis not present

## 2015-11-11 DIAGNOSIS — N2581 Secondary hyperparathyroidism of renal origin: Secondary | ICD-10-CM | POA: Diagnosis not present

## 2015-11-11 DIAGNOSIS — N184 Chronic kidney disease, stage 4 (severe): Secondary | ICD-10-CM | POA: Diagnosis not present

## 2015-11-11 DIAGNOSIS — I129 Hypertensive chronic kidney disease with stage 1 through stage 4 chronic kidney disease, or unspecified chronic kidney disease: Secondary | ICD-10-CM | POA: Diagnosis not present

## 2015-11-11 DIAGNOSIS — N028 Recurrent and persistent hematuria with other morphologic changes: Secondary | ICD-10-CM | POA: Diagnosis not present

## 2015-11-12 DIAGNOSIS — M150 Primary generalized (osteo)arthritis: Secondary | ICD-10-CM | POA: Diagnosis not present

## 2015-11-12 DIAGNOSIS — M1A09X0 Idiopathic chronic gout, multiple sites, without tophus (tophi): Secondary | ICD-10-CM | POA: Diagnosis not present

## 2015-11-12 DIAGNOSIS — M0589 Other rheumatoid arthritis with rheumatoid factor of multiple sites: Secondary | ICD-10-CM | POA: Diagnosis not present

## 2015-11-12 DIAGNOSIS — M810 Age-related osteoporosis without current pathological fracture: Secondary | ICD-10-CM | POA: Diagnosis not present

## 2015-11-26 DIAGNOSIS — D631 Anemia in chronic kidney disease: Secondary | ICD-10-CM | POA: Diagnosis not present

## 2015-11-26 DIAGNOSIS — N189 Chronic kidney disease, unspecified: Secondary | ICD-10-CM | POA: Diagnosis not present

## 2015-12-30 DIAGNOSIS — N189 Chronic kidney disease, unspecified: Secondary | ICD-10-CM | POA: Diagnosis not present

## 2015-12-30 DIAGNOSIS — D631 Anemia in chronic kidney disease: Secondary | ICD-10-CM | POA: Diagnosis not present

## 2016-01-27 DIAGNOSIS — N189 Chronic kidney disease, unspecified: Secondary | ICD-10-CM | POA: Diagnosis not present

## 2016-01-27 DIAGNOSIS — D631 Anemia in chronic kidney disease: Secondary | ICD-10-CM | POA: Diagnosis not present

## 2016-01-29 DIAGNOSIS — J0100 Acute maxillary sinusitis, unspecified: Secondary | ICD-10-CM | POA: Diagnosis not present

## 2016-02-10 DIAGNOSIS — M810 Age-related osteoporosis without current pathological fracture: Secondary | ICD-10-CM | POA: Diagnosis not present

## 2016-02-10 DIAGNOSIS — M150 Primary generalized (osteo)arthritis: Secondary | ICD-10-CM | POA: Diagnosis not present

## 2016-02-10 DIAGNOSIS — M1A09X0 Idiopathic chronic gout, multiple sites, without tophus (tophi): Secondary | ICD-10-CM | POA: Diagnosis not present

## 2016-02-10 DIAGNOSIS — M0589 Other rheumatoid arthritis with rheumatoid factor of multiple sites: Secondary | ICD-10-CM | POA: Diagnosis not present

## 2016-02-24 DIAGNOSIS — D631 Anemia in chronic kidney disease: Secondary | ICD-10-CM | POA: Diagnosis not present

## 2016-02-24 DIAGNOSIS — M25462 Effusion, left knee: Secondary | ICD-10-CM | POA: Diagnosis not present

## 2016-02-24 DIAGNOSIS — M1712 Unilateral primary osteoarthritis, left knee: Secondary | ICD-10-CM | POA: Diagnosis not present

## 2016-02-24 DIAGNOSIS — N189 Chronic kidney disease, unspecified: Secondary | ICD-10-CM | POA: Diagnosis not present

## 2016-03-03 DIAGNOSIS — M25462 Effusion, left knee: Secondary | ICD-10-CM | POA: Diagnosis not present

## 2016-03-03 DIAGNOSIS — S83282A Other tear of lateral meniscus, current injury, left knee, initial encounter: Secondary | ICD-10-CM | POA: Diagnosis not present

## 2016-03-03 DIAGNOSIS — M25562 Pain in left knee: Secondary | ICD-10-CM | POA: Diagnosis not present

## 2016-03-05 DIAGNOSIS — S83282A Other tear of lateral meniscus, current injury, left knee, initial encounter: Secondary | ICD-10-CM | POA: Diagnosis not present

## 2016-03-05 DIAGNOSIS — M1712 Unilateral primary osteoarthritis, left knee: Secondary | ICD-10-CM | POA: Diagnosis not present

## 2016-03-09 DIAGNOSIS — Z1231 Encounter for screening mammogram for malignant neoplasm of breast: Secondary | ICD-10-CM | POA: Diagnosis not present

## 2016-04-02 DIAGNOSIS — D631 Anemia in chronic kidney disease: Secondary | ICD-10-CM | POA: Diagnosis not present

## 2016-04-02 DIAGNOSIS — N189 Chronic kidney disease, unspecified: Secondary | ICD-10-CM | POA: Diagnosis not present

## 2016-04-03 DIAGNOSIS — N189 Chronic kidney disease, unspecified: Secondary | ICD-10-CM | POA: Diagnosis not present

## 2016-04-03 DIAGNOSIS — D631 Anemia in chronic kidney disease: Secondary | ICD-10-CM | POA: Diagnosis not present

## 2016-04-14 DIAGNOSIS — D631 Anemia in chronic kidney disease: Secondary | ICD-10-CM | POA: Diagnosis not present

## 2016-04-14 DIAGNOSIS — N184 Chronic kidney disease, stage 4 (severe): Secondary | ICD-10-CM | POA: Diagnosis not present

## 2016-04-14 DIAGNOSIS — N2581 Secondary hyperparathyroidism of renal origin: Secondary | ICD-10-CM | POA: Diagnosis not present

## 2016-04-14 DIAGNOSIS — M109 Gout, unspecified: Secondary | ICD-10-CM | POA: Diagnosis not present

## 2016-04-14 DIAGNOSIS — I129 Hypertensive chronic kidney disease with stage 1 through stage 4 chronic kidney disease, or unspecified chronic kidney disease: Secondary | ICD-10-CM | POA: Diagnosis not present

## 2016-05-01 DIAGNOSIS — D631 Anemia in chronic kidney disease: Secondary | ICD-10-CM | POA: Diagnosis not present

## 2016-05-01 DIAGNOSIS — N189 Chronic kidney disease, unspecified: Secondary | ICD-10-CM | POA: Diagnosis not present

## 2016-05-29 DIAGNOSIS — N189 Chronic kidney disease, unspecified: Secondary | ICD-10-CM | POA: Diagnosis not present

## 2016-05-29 DIAGNOSIS — D631 Anemia in chronic kidney disease: Secondary | ICD-10-CM | POA: Diagnosis not present

## 2016-06-18 DIAGNOSIS — M1A09X0 Idiopathic chronic gout, multiple sites, without tophus (tophi): Secondary | ICD-10-CM | POA: Diagnosis not present

## 2016-06-18 DIAGNOSIS — M810 Age-related osteoporosis without current pathological fracture: Secondary | ICD-10-CM | POA: Diagnosis not present

## 2016-06-18 DIAGNOSIS — M150 Primary generalized (osteo)arthritis: Secondary | ICD-10-CM | POA: Diagnosis not present

## 2016-06-18 DIAGNOSIS — M25562 Pain in left knee: Secondary | ICD-10-CM | POA: Diagnosis not present

## 2016-06-18 DIAGNOSIS — M0589 Other rheumatoid arthritis with rheumatoid factor of multiple sites: Secondary | ICD-10-CM | POA: Diagnosis not present

## 2016-06-26 DIAGNOSIS — D631 Anemia in chronic kidney disease: Secondary | ICD-10-CM | POA: Diagnosis not present

## 2016-06-26 DIAGNOSIS — N189 Chronic kidney disease, unspecified: Secondary | ICD-10-CM | POA: Diagnosis not present

## 2016-06-26 DIAGNOSIS — I10 Essential (primary) hypertension: Secondary | ICD-10-CM | POA: Diagnosis not present

## 2016-07-21 DIAGNOSIS — S52501A Unspecified fracture of the lower end of right radius, initial encounter for closed fracture: Secondary | ICD-10-CM | POA: Diagnosis not present

## 2016-07-30 DIAGNOSIS — D631 Anemia in chronic kidney disease: Secondary | ICD-10-CM | POA: Diagnosis not present

## 2016-07-30 DIAGNOSIS — N189 Chronic kidney disease, unspecified: Secondary | ICD-10-CM | POA: Diagnosis not present

## 2016-08-26 DIAGNOSIS — S52501A Unspecified fracture of the lower end of right radius, initial encounter for closed fracture: Secondary | ICD-10-CM | POA: Diagnosis not present

## 2016-08-28 DIAGNOSIS — N189 Chronic kidney disease, unspecified: Secondary | ICD-10-CM | POA: Diagnosis not present

## 2016-08-28 DIAGNOSIS — D631 Anemia in chronic kidney disease: Secondary | ICD-10-CM | POA: Diagnosis not present

## 2016-09-04 DIAGNOSIS — S52501A Unspecified fracture of the lower end of right radius, initial encounter for closed fracture: Secondary | ICD-10-CM | POA: Diagnosis not present

## 2016-09-09 DIAGNOSIS — Z23 Encounter for immunization: Secondary | ICD-10-CM | POA: Diagnosis not present

## 2016-09-14 DIAGNOSIS — I129 Hypertensive chronic kidney disease with stage 1 through stage 4 chronic kidney disease, or unspecified chronic kidney disease: Secondary | ICD-10-CM | POA: Diagnosis not present

## 2016-09-14 DIAGNOSIS — N2581 Secondary hyperparathyroidism of renal origin: Secondary | ICD-10-CM | POA: Diagnosis not present

## 2016-09-14 DIAGNOSIS — D631 Anemia in chronic kidney disease: Secondary | ICD-10-CM | POA: Diagnosis not present

## 2016-09-14 DIAGNOSIS — N184 Chronic kidney disease, stage 4 (severe): Secondary | ICD-10-CM | POA: Diagnosis not present

## 2016-09-14 DIAGNOSIS — M109 Gout, unspecified: Secondary | ICD-10-CM | POA: Diagnosis not present

## 2016-09-21 DIAGNOSIS — Z0000 Encounter for general adult medical examination without abnormal findings: Secondary | ICD-10-CM | POA: Diagnosis not present

## 2016-09-21 DIAGNOSIS — M059 Rheumatoid arthritis with rheumatoid factor, unspecified: Secondary | ICD-10-CM | POA: Diagnosis not present

## 2016-09-21 DIAGNOSIS — E039 Hypothyroidism, unspecified: Secondary | ICD-10-CM | POA: Diagnosis not present

## 2016-09-21 DIAGNOSIS — R42 Dizziness and giddiness: Secondary | ICD-10-CM | POA: Diagnosis not present

## 2016-09-21 DIAGNOSIS — Z79899 Other long term (current) drug therapy: Secondary | ICD-10-CM | POA: Diagnosis not present

## 2016-09-21 DIAGNOSIS — N028 Recurrent and persistent hematuria with other morphologic changes: Secondary | ICD-10-CM | POA: Diagnosis not present

## 2016-09-22 DIAGNOSIS — M810 Age-related osteoporosis without current pathological fracture: Secondary | ICD-10-CM | POA: Diagnosis not present

## 2016-09-22 DIAGNOSIS — M0589 Other rheumatoid arthritis with rheumatoid factor of multiple sites: Secondary | ICD-10-CM | POA: Diagnosis not present

## 2016-09-22 DIAGNOSIS — M25562 Pain in left knee: Secondary | ICD-10-CM | POA: Diagnosis not present

## 2016-09-22 DIAGNOSIS — M25561 Pain in right knee: Secondary | ICD-10-CM | POA: Diagnosis not present

## 2016-09-25 DIAGNOSIS — N189 Chronic kidney disease, unspecified: Secondary | ICD-10-CM | POA: Diagnosis not present

## 2016-09-25 DIAGNOSIS — D631 Anemia in chronic kidney disease: Secondary | ICD-10-CM | POA: Diagnosis not present

## 2016-09-28 DIAGNOSIS — N189 Chronic kidney disease, unspecified: Secondary | ICD-10-CM | POA: Diagnosis not present

## 2016-09-28 DIAGNOSIS — D631 Anemia in chronic kidney disease: Secondary | ICD-10-CM | POA: Diagnosis not present

## 2016-09-29 DIAGNOSIS — D631 Anemia in chronic kidney disease: Secondary | ICD-10-CM | POA: Diagnosis not present

## 2016-09-29 DIAGNOSIS — N189 Chronic kidney disease, unspecified: Secondary | ICD-10-CM | POA: Diagnosis not present

## 2016-10-13 DIAGNOSIS — S52501A Unspecified fracture of the lower end of right radius, initial encounter for closed fracture: Secondary | ICD-10-CM | POA: Diagnosis not present

## 2016-10-30 DIAGNOSIS — D631 Anemia in chronic kidney disease: Secondary | ICD-10-CM | POA: Diagnosis not present

## 2016-10-30 DIAGNOSIS — N189 Chronic kidney disease, unspecified: Secondary | ICD-10-CM | POA: Diagnosis not present

## 2016-11-11 DIAGNOSIS — J22 Unspecified acute lower respiratory infection: Secondary | ICD-10-CM | POA: Diagnosis not present

## 2016-11-11 DIAGNOSIS — H109 Unspecified conjunctivitis: Secondary | ICD-10-CM | POA: Diagnosis not present

## 2016-11-26 DIAGNOSIS — J209 Acute bronchitis, unspecified: Secondary | ICD-10-CM | POA: Diagnosis not present

## 2016-12-03 DIAGNOSIS — I120 Hypertensive chronic kidney disease with stage 5 chronic kidney disease or end stage renal disease: Secondary | ICD-10-CM | POA: Diagnosis not present

## 2016-12-03 DIAGNOSIS — R531 Weakness: Secondary | ICD-10-CM | POA: Diagnosis not present

## 2016-12-03 DIAGNOSIS — D631 Anemia in chronic kidney disease: Secondary | ICD-10-CM | POA: Diagnosis not present

## 2016-12-03 DIAGNOSIS — M069 Rheumatoid arthritis, unspecified: Secondary | ICD-10-CM | POA: Diagnosis not present

## 2016-12-03 DIAGNOSIS — J449 Chronic obstructive pulmonary disease, unspecified: Secondary | ICD-10-CM | POA: Diagnosis not present

## 2016-12-03 DIAGNOSIS — N186 End stage renal disease: Secondary | ICD-10-CM | POA: Diagnosis not present

## 2016-12-03 DIAGNOSIS — R05 Cough: Secondary | ICD-10-CM | POA: Diagnosis not present

## 2016-12-03 DIAGNOSIS — I313 Pericardial effusion (noninflammatory): Secondary | ICD-10-CM | POA: Diagnosis not present

## 2016-12-03 DIAGNOSIS — D72829 Elevated white blood cell count, unspecified: Secondary | ICD-10-CM | POA: Diagnosis not present

## 2016-12-03 DIAGNOSIS — R404 Transient alteration of awareness: Secondary | ICD-10-CM | POA: Diagnosis not present

## 2016-12-03 DIAGNOSIS — R0602 Shortness of breath: Secondary | ICD-10-CM | POA: Diagnosis not present

## 2016-12-03 DIAGNOSIS — J439 Emphysema, unspecified: Secondary | ICD-10-CM | POA: Diagnosis not present

## 2016-12-03 DIAGNOSIS — M0510 Rheumatoid lung disease with rheumatoid arthritis of unspecified site: Secondary | ICD-10-CM | POA: Diagnosis not present

## 2016-12-03 DIAGNOSIS — N179 Acute kidney failure, unspecified: Secondary | ICD-10-CM | POA: Diagnosis not present

## 2016-12-04 ENCOUNTER — Inpatient Hospital Stay (HOSPITAL_COMMUNITY)
Admission: AD | Admit: 2016-12-04 | Discharge: 2016-12-09 | DRG: 315 | Disposition: A | Discharge status: Home / Self Care | Payer: Medicare Other | Source: Other Acute Inpatient Hospital | Attending: Internal Medicine | Admitting: Internal Medicine

## 2016-12-04 ENCOUNTER — Encounter (HOSPITAL_COMMUNITY): Payer: Self-pay | Admitting: Family Medicine

## 2016-12-04 ENCOUNTER — Inpatient Hospital Stay (HOSPITAL_COMMUNITY): Payer: Medicare Other

## 2016-12-04 DIAGNOSIS — N179 Acute kidney failure, unspecified: Secondary | ICD-10-CM | POA: Diagnosis present

## 2016-12-04 DIAGNOSIS — Z8249 Family history of ischemic heart disease and other diseases of the circulatory system: Secondary | ICD-10-CM | POA: Diagnosis not present

## 2016-12-04 DIAGNOSIS — J441 Chronic obstructive pulmonary disease with (acute) exacerbation: Secondary | ICD-10-CM | POA: Diagnosis not present

## 2016-12-04 DIAGNOSIS — E872 Acidosis, unspecified: Secondary | ICD-10-CM | POA: Diagnosis present

## 2016-12-04 DIAGNOSIS — M109 Gout, unspecified: Secondary | ICD-10-CM | POA: Diagnosis not present

## 2016-12-04 DIAGNOSIS — I319 Disease of pericardium, unspecified: Secondary | ICD-10-CM | POA: Diagnosis not present

## 2016-12-04 DIAGNOSIS — D72829 Elevated white blood cell count, unspecified: Secondary | ICD-10-CM | POA: Diagnosis not present

## 2016-12-04 DIAGNOSIS — I313 Pericardial effusion (noninflammatory): Principal | ICD-10-CM | POA: Diagnosis present

## 2016-12-04 DIAGNOSIS — N19 Unspecified kidney failure: Secondary | ICD-10-CM | POA: Diagnosis present

## 2016-12-04 DIAGNOSIS — E440 Moderate protein-calorie malnutrition: Secondary | ICD-10-CM | POA: Diagnosis present

## 2016-12-04 DIAGNOSIS — Z7952 Long term (current) use of systemic steroids: Secondary | ICD-10-CM

## 2016-12-04 DIAGNOSIS — R0600 Dyspnea, unspecified: Secondary | ICD-10-CM | POA: Diagnosis not present

## 2016-12-04 DIAGNOSIS — M069 Rheumatoid arthritis, unspecified: Secondary | ICD-10-CM | POA: Diagnosis not present

## 2016-12-04 DIAGNOSIS — Z90710 Acquired absence of both cervix and uterus: Secondary | ICD-10-CM | POA: Diagnosis not present

## 2016-12-04 DIAGNOSIS — D638 Anemia in other chronic diseases classified elsewhere: Secondary | ICD-10-CM | POA: Diagnosis not present

## 2016-12-04 DIAGNOSIS — N184 Chronic kidney disease, stage 4 (severe): Secondary | ICD-10-CM | POA: Diagnosis present

## 2016-12-04 DIAGNOSIS — Z9842 Cataract extraction status, left eye: Secondary | ICD-10-CM | POA: Diagnosis not present

## 2016-12-04 DIAGNOSIS — D649 Anemia, unspecified: Secondary | ICD-10-CM | POA: Diagnosis present

## 2016-12-04 DIAGNOSIS — N028 Recurrent and persistent hematuria with other morphologic changes: Secondary | ICD-10-CM | POA: Diagnosis present

## 2016-12-04 DIAGNOSIS — T380X5A Adverse effect of glucocorticoids and synthetic analogues, initial encounter: Secondary | ICD-10-CM | POA: Diagnosis present

## 2016-12-04 DIAGNOSIS — Z6820 Body mass index (BMI) 20.0-20.9, adult: Secondary | ICD-10-CM

## 2016-12-04 DIAGNOSIS — R739 Hyperglycemia, unspecified: Secondary | ICD-10-CM | POA: Diagnosis present

## 2016-12-04 DIAGNOSIS — J069 Acute upper respiratory infection, unspecified: Secondary | ICD-10-CM | POA: Diagnosis present

## 2016-12-04 DIAGNOSIS — N2889 Other specified disorders of kidney and ureter: Secondary | ICD-10-CM | POA: Diagnosis not present

## 2016-12-04 DIAGNOSIS — I130 Hypertensive heart and chronic kidney disease with heart failure and stage 1 through stage 4 chronic kidney disease, or unspecified chronic kidney disease: Secondary | ICD-10-CM | POA: Diagnosis not present

## 2016-12-04 DIAGNOSIS — E039 Hypothyroidism, unspecified: Secondary | ICD-10-CM | POA: Diagnosis present

## 2016-12-04 DIAGNOSIS — J449 Chronic obstructive pulmonary disease, unspecified: Secondary | ICD-10-CM | POA: Diagnosis not present

## 2016-12-04 DIAGNOSIS — Z833 Family history of diabetes mellitus: Secondary | ICD-10-CM

## 2016-12-04 DIAGNOSIS — I3139 Other pericardial effusion (noninflammatory): Secondary | ICD-10-CM | POA: Diagnosis present

## 2016-12-04 DIAGNOSIS — Z9841 Cataract extraction status, right eye: Secondary | ICD-10-CM | POA: Diagnosis not present

## 2016-12-04 DIAGNOSIS — E871 Hypo-osmolality and hyponatremia: Secondary | ICD-10-CM | POA: Diagnosis present

## 2016-12-04 DIAGNOSIS — I10 Essential (primary) hypertension: Secondary | ICD-10-CM

## 2016-12-04 DIAGNOSIS — Z79899 Other long term (current) drug therapy: Secondary | ICD-10-CM

## 2016-12-04 DIAGNOSIS — J439 Emphysema, unspecified: Secondary | ICD-10-CM | POA: Diagnosis present

## 2016-12-04 DIAGNOSIS — N189 Chronic kidney disease, unspecified: Secondary | ICD-10-CM

## 2016-12-04 DIAGNOSIS — I509 Heart failure, unspecified: Secondary | ICD-10-CM | POA: Diagnosis not present

## 2016-12-04 DIAGNOSIS — I129 Hypertensive chronic kidney disease with stage 1 through stage 4 chronic kidney disease, or unspecified chronic kidney disease: Secondary | ICD-10-CM | POA: Diagnosis present

## 2016-12-04 LAB — COMPREHENSIVE METABOLIC PANEL WITH GFR
ALT: 10 U/L — ABNORMAL LOW (ref 14–54)
AST: 15 U/L (ref 15–41)
Albumin: 2.7 g/dL — ABNORMAL LOW (ref 3.5–5.0)
Alkaline Phosphatase: 83 U/L (ref 38–126)
Anion gap: 13 (ref 5–15)
BUN: 71 mg/dL — ABNORMAL HIGH (ref 6–20)
CO2: 19 mmol/L — ABNORMAL LOW (ref 22–32)
Calcium: 9 mg/dL (ref 8.9–10.3)
Chloride: 97 mmol/L — ABNORMAL LOW (ref 101–111)
Creatinine, Ser: 3.85 mg/dL — ABNORMAL HIGH (ref 0.44–1.00)
GFR calc Af Amer: 12 mL/min — ABNORMAL LOW (ref >60)
GFR calc non Af Amer: 10 mL/min — ABNORMAL LOW (ref >60)
Glucose, Bld: 122 mg/dL — ABNORMAL HIGH (ref 65–99)
Potassium: 4.3 mmol/L (ref 3.5–5.1)
Sodium: 129 mmol/L — ABNORMAL LOW (ref 135–145)
Total Bilirubin: 0.4 mg/dL (ref 0.3–1.2)
Total Protein: 6.4 g/dL — ABNORMAL LOW (ref 6.5–8.1)

## 2016-12-04 LAB — CBC WITH DIFFERENTIAL/PLATELET
Basophils Absolute: 0 K/uL (ref 0.0–0.1)
Basophils Relative: 0 %
Eosinophils Absolute: 0 K/uL (ref 0.0–0.7)
Eosinophils Relative: 0 %
HCT: 26.6 % — ABNORMAL LOW (ref 36.0–46.0)
Hemoglobin: 9 g/dL — ABNORMAL LOW (ref 12.0–15.0)
Lymphocytes Relative: 1 %
Lymphs Abs: 0.3 K/uL — ABNORMAL LOW (ref 0.7–4.0)
MCH: 28 pg (ref 26.0–34.0)
MCHC: 33.8 g/dL (ref 30.0–36.0)
MCV: 82.6 fL (ref 78.0–100.0)
Monocytes Absolute: 0.9 K/uL (ref 0.1–1.0)
Monocytes Relative: 3 %
Neutro Abs: 28 K/uL — ABNORMAL HIGH (ref 1.7–7.7)
Neutrophils Relative %: 96 %
Platelets: 282 K/uL (ref 150–400)
RBC: 3.22 MIL/uL — ABNORMAL LOW (ref 3.87–5.11)
RDW: 14.3 % (ref 11.5–15.5)
WBC: 29.2 K/uL — ABNORMAL HIGH (ref 4.0–10.5)

## 2016-12-04 LAB — BRAIN NATRIURETIC PEPTIDE: B Natriuretic Peptide: 299.9 pg/mL — ABNORMAL HIGH (ref 0.0–100.0)

## 2016-12-04 LAB — MAGNESIUM: Magnesium: 2 mg/dL (ref 1.7–2.4)

## 2016-12-04 LAB — TSH: TSH: 0.815 u[IU]/mL (ref 0.350–4.500)

## 2016-12-04 MED ORDER — INSULIN ASPART 100 UNIT/ML ~~LOC~~ SOLN: 0.0000 [IU] | Freq: Three times a day (TID) | SUBCUTANEOUS | Status: DC

## 2016-12-04 MED ORDER — SODIUM CHLORIDE 0.9% FLUSH
3.0000 mL | Freq: Two times a day (BID) | INTRAVENOUS | Status: DC
  Administered 2016-12-04 – 2016-12-09 (×7): 3 mL via INTRAVENOUS

## 2016-12-04 MED ORDER — HEPARIN SODIUM (PORCINE) 5000 UNIT/ML IJ SOLN
5000.0000 [IU] | Freq: Three times a day (TID) | INTRAMUSCULAR | Status: DC
  Administered 2016-12-04 – 2016-12-09 (×14): 5000 [IU] via SUBCUTANEOUS
  Filled 2016-12-04 (×14): qty 1

## 2016-12-04 MED ORDER — ONDANSETRON HCL 4 MG PO TABS: 4.0000 mg | ORAL_TABLET | Freq: Four times a day (QID) | ORAL | Status: DC | PRN

## 2016-12-04 MED ORDER — CALCITRIOL 0.25 MCG PO CAPS
0.2500 ug | ORAL_CAPSULE | ORAL | Status: DC
  Administered 2016-12-07: 0.25 ug via ORAL
  Filled 2016-12-04: qty 1

## 2016-12-04 MED ORDER — METHYLPREDNISOLONE 4 MG PO TABS: 4.0000 mg | ORAL_TABLET | Freq: Every day | ORAL | Status: DC

## 2016-12-04 MED ORDER — VITAMIN D 1000 UNITS PO TABS
1000.0000 [IU] | ORAL_TABLET | Freq: Every day | ORAL | Status: DC
  Administered 2016-12-05 – 2016-12-09 (×5): 1000 [IU] via ORAL
  Filled 2016-12-04 (×5): qty 1

## 2016-12-04 MED ORDER — SODIUM CHLORIDE 0.9% FLUSH: 3.0000 mL | INTRAVENOUS | Status: DC | PRN

## 2016-12-04 MED ORDER — TRAMADOL HCL 50 MG PO TABS
50.0000 mg | ORAL_TABLET | Freq: Four times a day (QID) | ORAL | Status: DC | PRN
Start: 2016-12-04 — End: 2016-12-09

## 2016-12-04 MED ORDER — LEVOTHYROXINE SODIUM 75 MCG PO TABS
75.0000 ug | ORAL_TABLET | Freq: Every day | ORAL | Status: DC
  Administered 2016-12-05 – 2016-12-09 (×5): 75 ug via ORAL
  Filled 2016-12-04 (×5): qty 1

## 2016-12-04 MED ORDER — HYDROXYCHLOROQUINE SULFATE 200 MG PO TABS
200.0000 mg | ORAL_TABLET | Freq: Two times a day (BID) | ORAL | Status: DC
  Administered 2016-12-04: 200 mg via ORAL
  Filled 2016-12-04: qty 1

## 2016-12-04 MED ORDER — AMLODIPINE BESYLATE 5 MG PO TABS
5.0000 mg | ORAL_TABLET | Freq: Every day | ORAL | Status: DC
  Administered 2016-12-05 – 2016-12-09 (×5): 5 mg via ORAL
  Filled 2016-12-04 (×5): qty 1

## 2016-12-04 MED ORDER — SODIUM CHLORIDE 0.9% FLUSH
3.0000 mL | Freq: Two times a day (BID) | INTRAVENOUS | Status: DC
  Administered 2016-12-05 – 2016-12-09 (×4): 3 mL via INTRAVENOUS

## 2016-12-04 MED ORDER — SODIUM CHLORIDE 0.9 % IV SOLN: 250.0000 mL | INTRAVENOUS | Status: DC | PRN

## 2016-12-04 MED ORDER — CLONIDINE HCL 0.2 MG PO TABS
0.2000 mg | ORAL_TABLET | Freq: Two times a day (BID) | ORAL | Status: DC
  Administered 2016-12-04 – 2016-12-09 (×10): 0.2 mg via ORAL
  Filled 2016-12-04 (×8): qty 1
  Filled 2016-12-04: qty 2
  Filled 2016-12-04 (×2): qty 1

## 2016-12-04 MED ORDER — ACETAMINOPHEN 500 MG PO TABS
500.0000 mg | ORAL_TABLET | Freq: Four times a day (QID) | ORAL | Status: DC | PRN
Start: 2016-12-04 — End: 2016-12-09
  Administered 2016-12-05 – 2016-12-08 (×4): 500 mg via ORAL
  Filled 2016-12-04 (×4): qty 1

## 2016-12-04 MED ORDER — ALLOPURINOL 100 MG PO TABS
100.0000 mg | ORAL_TABLET | Freq: Every day | ORAL | Status: DC
  Administered 2016-12-05 – 2016-12-09 (×5): 100 mg via ORAL
  Filled 2016-12-04 (×5): qty 1

## 2016-12-04 MED ORDER — METHYLPREDNISOLONE SODIUM SUCC 125 MG IJ SOLR
60.0000 mg | Freq: Four times a day (QID) | INTRAMUSCULAR | Status: DC
  Administered 2016-12-04 – 2016-12-08 (×14): 60 mg via INTRAVENOUS
  Filled 2016-12-04 (×14): qty 2

## 2016-12-04 MED ORDER — DOXYCYCLINE HYCLATE 100 MG IV SOLR
100.0000 mg | Freq: Two times a day (BID) | INTRAVENOUS | Status: DC
  Administered 2016-12-05: 100 mg via INTRAVENOUS
  Filled 2016-12-04 (×2): qty 100

## 2016-12-04 MED ORDER — INSULIN ASPART 100 UNIT/ML ~~LOC~~ SOLN: 0.0000 [IU] | Freq: Every day | SUBCUTANEOUS | Status: DC

## 2016-12-04 MED ORDER — ONDANSETRON HCL 4 MG/2ML IJ SOLN
4.0000 mg | Freq: Four times a day (QID) | INTRAMUSCULAR | Status: DC | PRN
  Filled 2016-12-04: qty 2

## 2016-12-04 MED ORDER — SODIUM BICARBONATE 650 MG PO TABS
650.0000 mg | ORAL_TABLET | Freq: Three times a day (TID) | ORAL | Status: DC
  Administered 2016-12-04 – 2016-12-09 (×14): 650 mg via ORAL
  Filled 2016-12-04 (×14): qty 1

## 2016-12-04 MED ORDER — POLYETHYLENE GLYCOL 3350 17 G PO PACK: 17.0000 g | PACK | Freq: Every day | ORAL | Status: DC | PRN

## 2016-12-04 MED ORDER — PANTOPRAZOLE SODIUM 40 MG PO TBEC
40.0000 mg | DELAYED_RELEASE_TABLET | Freq: Every day | ORAL | Status: DC
  Administered 2016-12-05 – 2016-12-09 (×5): 40 mg via ORAL
  Filled 2016-12-04 (×6): qty 1

## 2016-12-04 MED ORDER — FOLIC ACID 1 MG PO TABS
1.0000 mg | ORAL_TABLET | Freq: Every day | ORAL | Status: DC
  Administered 2016-12-05 – 2016-12-09 (×5): 1 mg via ORAL
  Filled 2016-12-04 (×5): qty 1

## 2016-12-04 NOTE — H&P (Signed)
History and Physical    Natajha Pintado KGS:[id number] DOB: 11/16/1934 DOA: 12/04/2016  PCP: Maryjean Ka, MD   Patient coming from: Home, by way of [facility]  Chief Complaint: Dyspnea, generalized weakness, cough  HPI: Margaux Keitz is a 83 y.o. female with medical history significant for rheumatoid arthritis, chronic kidney disease stage IV, hypertension, and chronic anemia who presents in transfer from [facility] where she had been admitted with a suspected infectious exacerbation of her chronic lung disease, and was found to have pericardial effusion. Patient was reportedly in her usual state of health, active doing yard work, cycling, and volunteering at the local homeless shelter, until a couple weeks ago when she noted the insidious development of generalized weakness, fatigue, dyspnea, and then more recently, subjective fevers. She was placed on Levaquin by her primary care physician for suspected acute bronchitis, but actually continued to worsen despite completing a one week course. She then presented to [facility] where she was admitted, treated with empiric Rocephin and IV Solu-Medrol. Chest CT demonstrated extensive emphysematous changes without focal infiltrate. Echocardiogram was notable for a moderate generalized pericardial effusion without tamponade not. She was noted to have acute kidney injury with serum creatinine of 3.8, up from her baseline of 2.7. The patient's rheumatologist was involved in her care at the hospital and concern for rheumatic lung disease. There was concern that the patient may require a diagnostic pericardial fluid aspiration and so transferred to [facility] was arranged.   Review of Systems:  All other systems reviewed and apart from HPI, are negative.  Past Medical History:  Diagnosis Date  . Anemia   . Arthritis   . Chronic kidney disease   . Gout   . Hypertension     Past Surgical History:  Procedure Laterality  Date  . ABDOMINAL HYSTERECTOMY    . AV FISTULA PLACEMENT  06/27/2009   right upper arm BC vein AVF  . CATARACT EXTRACTION, BILATERAL  2012  . JOINT REPLACEMENT  1995   right hip     reports that she has never smoked. She has never used smokeless tobacco. She reports that she does not drink alcohol or use drugs.  No Known Allergies  Family History  Problem Relation Age of Onset  . Heart disease Mother   . Heart attack Father   . Cancer Sister   . Other Sister     varicose veins  . Diabetes Brother   . Heart attack Daughter      Prior to Admission medications   Medication Sig Start Date End Date Taking? Authorizing Provider  acetaminophen (TYLENOL) 500 MG tablet Take 500 mg by mouth every 6 (six) hours as needed.    Historical Provider, MD  allopurinol (ZYLOPRIM) 100 MG tablet Take 1 tablet by mouth daily. 10/16/12   Historical Provider, MD  amLODipine (NORVASC) 5 MG tablet Take 1 tablet by mouth daily. 11/05/12   Historical Provider, MD  calcitRIOL (ROCALTROL) 0.25 MCG capsule Take 1 capsule by mouth 2 (two) times a week. Monday and Friday 10/16/12   Historical Provider, MD  cholecalciferol (VITAMIN D) 1000 UNITS tablet Take 1,000 Units by mouth daily.    Historical Provider, MD  cloNIDine (CATAPRES) 0.2 MG tablet Take 1 tablet by mouth 2 (two) times daily. 09/10/12   Historical Provider, MD  folic acid (FOLVITE) 1 MG tablet Take 1 mg by mouth daily.    Historical Provider, MD  furosemide (LASIX) 40 MG tablet Take 1 tablet by  mouth daily. 11/05/12   Historical Provider, MD  hydroxychloroquine (PLAQUENIL) 200 MG tablet Take 1 tablet by mouth 2 (two) times daily. 11/02/12   Historical Provider, MD  Lansoprazole (PREVACID PO) Take 15 mg by mouth daily.    Historical Provider, MD  levothyroxine (SYNTHROID, LEVOTHROID) 75 MCG tablet Take 1 tablet by mouth daily. 09/28/12   Historical Provider, MD  losartan (COZAAR) 50 MG tablet Take 1 tablet by mouth daily. 09/28/12   Historical  Provider, MD  methylPREDNISolone (MEDROL) 4 MG tablet Take 1 tablet by mouth daily. 09/28/12   Historical Provider, MD  sodium bicarbonate 650 MG tablet Take 650 mg by mouth 3 (three) times daily.    Historical Provider, MD  traMADol (ULTRAM) 50 MG tablet Take 50 mg by mouth every 6 (six) hours as needed.    Historical Provider, MD    Physical Exam: Vitals:   12/04/16 2122  BP: 118/65  Pulse: 83  Resp: 20  Temp: 98.5 F (36.9 C)  TempSrc: Oral  SpO2: 97%  Weight: 58.4 kg (128 lb 11.2 oz)  Height: 5\' 7"  (1.702 m)      Constitutional: NAD, calm, comfortable Eyes: PERTLA, lids and conjunctivae normal ENMT: Mucous membranes are moist. Posterior pharynx clear of any exudate or lesions.   Neck: normal, supple, no masses, no thyromegaly Respiratory: clear to auscultation bilaterally, no wheezing, no crackles. Normal respiratory effort.   Cardiovascular: S1 & S2 heard, regular rate and rhythm, soft systolic murmur at LSB. No extremity edema. JVP 9 cm H2O. Abdomen: No distension, no tenderness, no masses palpated. Bowel sounds normal.  Musculoskeletal: no clubbing / cyanosis. No joint deformity upper and lower extremities. Normal muscle tone.  Skin: no significant rashes, lesions, ulcers. Warm, dry, well-perfused. Neurologic: CN 2-12 grossly intact. Sensation intact, DTR normal. Strength 5/5 in all 4 limbs.  Psychiatric: Normal judgment and insight. Alert and oriented x 3. Normal mood and affect.     Labs on Admission: I have personally reviewed following labs and imaging studies  CBC: No results for input(s): WBC, NEUTROABS, HGB, HCT, MCV, PLT in the last 168 hours. Basic Metabolic Panel: No results for input(s): NA, K, CL, CO2, GLUCOSE, BUN, CREATININE, CALCIUM, MG, PHOS in the last 168 hours. GFR: CrCl cannot be calculated (No order found.). Liver Function Tests: No results for input(s): AST, ALT, ALKPHOS, BILITOT, PROT, ALBUMIN in the last 168 hours. No results for input(s):  LIPASE, AMYLASE in the last 168 hours. No results for input(s): AMMONIA in the last 168 hours. Coagulation Profile: No results for input(s): INR, PROTIME in the last 168 hours. Cardiac Enzymes: No results for input(s): CKTOTAL, CKMB, CKMBINDEX, TROPONINI in the last 168 hours. BNP (last 3 results) No results for input(s): PROBNP in the last 8760 hours. HbA1C: No results for input(s): HGBA1C in the last 72 hours. CBG: No results for input(s): GLUCAP in the last 168 hours. Lipid Profile: No results for input(s): CHOL, HDL, LDLCALC, TRIG, CHOLHDL, LDLDIRECT in the last 72 hours. Thyroid Function Tests: No results for input(s): TSH, T4TOTAL, FREET4, T3FREE, THYROIDAB in the last 72 hours. Anemia Panel: No results for input(s): VITAMINB12, FOLATE, FERRITIN, TIBC, IRON, RETICCTPCT in the last 72 hours. Urine analysis: No results found for: COLORURINE, APPEARANCEUR, LABSPEC, PHURINE, GLUCOSEU, HGBUR, BILIRUBINUR, KETONESUR, PROTEINUR, UROBILINOGEN, NITRITE, LEUKOCYTESUR Sepsis Labs: @LABRCNTIP (procalcitonin:4,lacticidven:4) )No results found for this or any previous visit (from the past 240 hour(s)).   Radiological Exams on Admission: No results found.  EKG: Independently reviewed. Normal sinus rhythm.  Assessment/Plan  1. Pericardial effusion without tamponade  - Pt had TTE performed at [facility] with moderate-sized generalized pericardial effusion without tamponade  - Has been suspected secondary to RA and/or uremia; her rheumatologist wanted a diagnostic pericardial effusion aspiration, but it could not be performed at the outside hospital and so transfer to [facility] was arranged - RA is being treated with steroids as below; uremia has worsened since admission to [facility]  - There is mild elevation in JVP, but no other clinical signs of tamponade   - Plan to address uremia and RA as below, monitor on telemetry, repeat TTE as her improved dyspnea and fatigue may reflect a resolving  effusion    2. Emphysema with acute exacerbation  - Pt was admitted to [facility] with worsening dyspnea, cough, subjective fevers despite treatment with a week of Levaquin outpatient  - She has remained afebrile; leukocytosis on steroids  - She was started on IV steroids and Rocephin, had negative flu swab - CT chest demonstrated extensive emphysematous changes without acute infiltrate  - Rheumatology is concerned for rheumatoid lung disease  - Of note, pt is eager to get back home to feed the birds in her backyard that "depend on her," and hypersensitivity pneumonitis is a consideration - She reports that symptoms have been improving since admission to [facility] to continue IV steroid, obtain high-resolution chest CT    3. Rheumatoid arthritis  - Follows with Dr. Kathi Ludwig, [phone]  - Managed with steroids and Plaquenil  - Continue steroid with IV Solu-Medrol - Plaquenil held on admission given acute on chronic renal disease  - Concern for lung involvement as above   4. Acute kidney injury superimposed on CKD stage IV - SCr was 3.8 on presentation to [facility] 12/04/15, up from reported baseline of 2.7  - Her CKD has been attributed to IgA nephropathy and she was previously on HD through AVF that no longer functions  - SCr had improved to 3.60 on 1/5, but back to 3.85 on admission here  - Plan to hold losartan, Lasix, and Plaquenil on admission - Hesitant to give fluid in setting of known CHF and elevated BNP  - Check urine studies, renal US  - Avoid nephrotoxins, renally-dose medications, repeat chem panel in am    5. Hypertension  - BP is at goal  - Plan to continue Norvasc as tolerated  - Lasix and losartan held on admission in setting of AKI    6. Hypothyroidism  - Appears stable, recent TSH wnl  - Continue Synthroid    7. Metabolic acidosis  - Mild, likely secondary to acute on chronic kidney disease  - Continue her bicarbonate therapy with 650 mg TID    8. Left renal  mass  - Noted incidentally on chest CT at [facility] with [place] follow-up advised by radiology  - Routine renal US will be obtained  9. Leukocytosis - WBC 23,900 on admission - There is no fever and no apparent focus of infection  - Likely secondary to steroids   10. Hyponatremia  - Serum sodium 129 on admission, down slightly from the day prior  - Pt appears mildly volume-up on admission  - Complicated by acute on chronic renal disease  - Continue NaHCO3 tabs TID  - Repeat chem panel in am    DVT prophylaxis: sq heparin  Code Status: Full  Family Communication: Discussed with patient Disposition Plan: Admit to telemetry Consults called: None Admission status: Inpatient    Marcial Pacas  Francesco Runner, MD [facility] Pager [phone]  If 7PM-7AM, please contact night-coverage [email or web address] Password [email or web address]  12/04/2016, 10:25 PM

## 2016-12-05 ENCOUNTER — Inpatient Hospital Stay (HOSPITAL_COMMUNITY): Payer: Medicare Other

## 2016-12-05 ENCOUNTER — Encounter (HOSPITAL_COMMUNITY): Payer: Self-pay | Admitting: *Deleted

## 2016-12-05 DIAGNOSIS — R0600 Dyspnea, unspecified: Secondary | ICD-10-CM

## 2016-12-05 DIAGNOSIS — N179 Acute kidney failure, unspecified: Secondary | ICD-10-CM

## 2016-12-05 DIAGNOSIS — E440 Moderate protein-calorie malnutrition: Secondary | ICD-10-CM | POA: Insufficient documentation

## 2016-12-05 LAB — CREATININE, URINE, RANDOM: CREATININE, URINE: 64.05 mg/dL

## 2016-12-05 LAB — BASIC METABOLIC PANEL
ANION GAP: 16 — AB (ref 5–15)
BUN: 72 mg/dL — ABNORMAL HIGH (ref 6–20)
CALCIUM: 8.6 mg/dL — AB (ref 8.9–10.3)
CO2: 18 mmol/L — ABNORMAL LOW (ref 22–32)
CREATININE: 3.48 mg/dL — AB (ref 0.44–1.00)
Chloride: 96 mmol/L — ABNORMAL LOW (ref 101–111)
GFR, EST AFRICAN AMERICAN: 13 mL/min — AB (ref >60)
GFR, EST NON AFRICAN AMERICAN: 11 mL/min — AB (ref >60)
GLUCOSE: 142 mg/dL — AB (ref 65–99)
Potassium: 4 mmol/L (ref 3.5–5.1)
Sodium: 130 mmol/L — ABNORMAL LOW (ref 135–145)

## 2016-12-05 LAB — URINALYSIS, ROUTINE W REFLEX MICROSCOPIC
Bilirubin Urine: NEGATIVE
Glucose, UA: NEGATIVE mg/dL
Hgb urine dipstick: NEGATIVE
Ketones, ur: NEGATIVE mg/dL
Leukocytes, UA: NEGATIVE
NITRITE: NEGATIVE
PROTEIN: NEGATIVE mg/dL
SPECIFIC GRAVITY, URINE: 1.011 (ref 1.005–1.030)
pH: 5 (ref 5.0–8.0)

## 2016-12-05 LAB — SODIUM, URINE, RANDOM

## 2016-12-05 LAB — GLUCOSE, CAPILLARY
Glucose-Capillary: 100 mg/dL — ABNORMAL HIGH (ref 65–99)
Glucose-Capillary: 124 mg/dL — ABNORMAL HIGH (ref 65–99)
Glucose-Capillary: 119 mg/dL — ABNORMAL HIGH (ref 65–99)

## 2016-12-05 MED ORDER — ENSURE ENLIVE PO LIQD
237.0000 mL | Freq: Two times a day (BID) | ORAL | Status: DC
Start: 2016-12-05 — End: 2016-12-09
  Administered 2016-12-07: 237 mL via ORAL

## 2016-12-05 NOTE — Progress Notes (Signed)
[facility]                                                                              Patient Demographics  Rachel Johns, is a 83 y.o. female, DOB - 1934/01/29, WLS:[id number]  Admit date - 12/04/2016   Admitting Physician Briscoe Deutscher, MD  Outpatient Primary MD for the patient is Maryjean Ka, MD  Outpatient specialists:   LOS - 1  days    No chief complaint on file.      Brief summary   Rachel Johns is a 83 y.o. female with medical history significant for rheumatoid arthritis, chronic kidney disease stage IV, hypertension, and chronic anemia who presented in transfer from [facility] where she was admitted with a suspected infectious exacerbation of her chronic lung disease, and was found to have pericardial effusion. Patient was reportedly in her usual state of health, until a couple weeks ago when she noted the insidious development of generalized weakness, fatigue, dyspnea, and then more recently, subjective fevers. She was placed on Levaquin by her PCP for acute bronchitis, but actually continued to worsen despite completing a one week course. She then presented to [facility] where she was admitted, treated with empiric Rocephin and IV Solu-Medrol. Chest CT demonstrated extensive emphysematous changes without focal infiltrate. Echocardiogram was notable for a moderate generalized pericardial effusion without tamponade. She was noted to have acute kidney injury with serum creatinine of 3.8, up from her baseline of 2.7. The patient's rheumatologist was involved in her care with concern for rheumatic lung disease. There was concern that the patient may require a diagnostic pericardial fluid aspiration hence transferred to [facility].   Assessment & Plan     Pericardial effusion without tamponade  - Pt had TTE performed at [facility] with moderate-sized generalized pericardial effusion without tamponade  - was suspected secondary  to RA and/or uremia; her rheumatologist wanted a diagnostic pericardial effusion aspiration, so transfer to [facility] was arranged - RA is being treated with steroids, uremia has worsened since admission to [facility] to repeat 2-D echocardiogram, per patient her dyspnea and fatigue has been improving, may reflect resolving effusion. - Creatinine function also appears to be improving   Emphysema with acute exacerbation  - Pt was admitted to [facility] with worsening dyspnea, cough, subjective fevers despite treatment with a week of Levaquin outpatient  - Afebrile, continue IV steroids, Rocephin, had negative flu swab - CT chest demonstrated extensive emphysematous changes without acute infiltrate, Rheumatology was concerned for rheumatoid lung disease  - Of note, pt is eager to get back home to feed the birds in her backyard that "depend on her," and hypersensitivity pneumonitis is a consideration - Plan to continue IV steroid, difficult to obtain HRCT due to acute renal insufficiency on CKD stage IV due to contrast -  high-resolution chest CT    Rheumatoid arthritis  - Follows with Dr. Kathi Ludwig, [phone]  - Managed with steroids and Plaquenil, continue IV Solu-Medrol - Plaquenil held on admission given acute on chronic renal disease  - Concern for lung involvement as above   Acute kidney injury superimposed on  CKD stage IV - SCr was 3.8 on presentation to [facility] 12/04/15, up from reported baseline of 2.7  - Her CKD has been attributed to IgA nephropathy and she was previously on HD through [facility] that no longer functions  - Creatinine function improving, hold losartan, Lasix, and Plaquenil on admission - Hesitant to give fluid in setting of known CHF and elevated BNP  - Avoid nephrotoxins, renally-dose medications   Hypertension  - Plan to continue Norvasc as tolerated  - Lasix and losartan held on admission in setting of AKI     Hypothyroidism  - Appears stable, recent TSH wnl  -  Continue Synthroid     Metabolic acidosis  - Mild, likely secondary to acute on chronic kidney disease  - Continue her bicarbonate therapy with 650 mg TID    Left renal mass  - Noted incidentally on chest CT at [facility] with [place], follow-up advised by radiology  - Routine renal US will be obtained   Leukocytosis - WBC 23,900 on admission, no fever and no apparent focus of infection  - Likely secondary to steroids    Hyponatremia  - Complicated by acute on chronic renal disease,  Continue NaHCO3 tabs TID   DVT prophylaxis: sq heparin   Code Status: full  DVT Prophylaxis:  heparin  Family Communication: Discussed in detail with the patient, all imaging results, lab results explained to the patient or   Disposition Plan:   Time Spent in minutes  Procedures:    Consultants:     Antimicrobials:      Medications  Scheduled Meds: . allopurinol  100 mg Oral Daily  . amLODipine  5 mg Oral Daily  . [START ON 12/07/2016] calcitRIOL  0.25 mcg Oral Once per day on Mon Fri  . cholecalciferol  1,000 Units Oral Daily  . cloNIDine  0.2 mg Oral BID  . folic acid  1 mg Oral Daily  . heparin  5,000 Units Subcutaneous Q8H  . levothyroxine  75 mcg Oral QAC breakfast  . methylPREDNISolone (SOLU-MEDROL) injection  60 mg Intravenous Q6H  . pantoprazole  40 mg Oral Daily  . sodium bicarbonate  650 mg Oral TID  . sodium chloride flush  3 mL Intravenous Q12H  . sodium chloride flush  3 mL Intravenous Q12H   Continuous Infusions: PRN Meds:.sodium chloride, acetaminophen, ondansetron **OR** ondansetron (ZOFRAN) IV, polyethylene glycol, sodium chloride flush, traMADol   Antibiotics   Anti-infectives    Start     Dose/Rate Route Frequency Ordered Stop   12/04/16 2359  doxycycline (VIBRAMYCIN) 100 mg in dextrose 5 % 250 mL IVPB  Status:  Discontinued     100 mg 125 mL/hr over 120 Minutes Intravenous 2 times daily 12/04/16 2315 12/05/16 0230   12/04/16 2245   hydroxychloroquine (PLAQUENIL) tablet 200 mg  Status:  Discontinued     200 mg Oral 2 times daily 12/04/16 2224 12/05/16 0003        Subjective:   Rachel Johns was seen and examined today.  Feels better today, watching TV, afebrile, shortness of breath is improving. No chest pain. Patient denies dizziness, chest pain, abdominal pain, N/V/D/C, new weakness, numbess, tingling. No acute events overnight.    Objective:   Vitals:   12/04/16 2122 12/05/16 0130 12/05/16 0345 12/05/16 1006  BP: 118/65 (!) 101/45 107/60 107/61  Pulse: 83 82 77 86  Resp: 20 18 20    Temp: 98.5 F (36.9 C) 97.5 F (36.4 C) 97.5 F (36.4 C)  TempSrc: Oral Oral Oral   SpO2: 97% 97% 96% 100%  Weight: 58.4 kg (128 lb 11.2 oz)  57.7 kg (127 lb 1.6 oz)   Height: 5\' 7"  (1.702 m)       Intake/Output Summary (Last 24 hours) at 12/05/16 1123 Last data filed at 12/05/16 1044  Gross per 24 hour  Intake              733 ml  Output              900 ml  Net             -167 ml     Wt Readings from Last 3 Encounters:  12/05/16 57.7 kg (127 lb 1.6 oz)  11/08/12 68 kg (150 lb)     Exam  General: Alert and oriented x 3, NAD  HEENT:  PERRLA, EOMI, Anicteric Sclera, mucous membranes moist.   Neck: Supple, no JVD, no masses  Cardiovascular: S1 S2 auscultated, no rubs, murmurs or gallops. Regular rate and rhythm.  Respiratory: Clear to auscultation bilaterally, no wheezing, rales or rhonchi  Gastrointestinal: Soft, nontender, nondistended, + bowel sounds  Ext: no cyanosis clubbing or edema  Neuro: AAOx3, Cr N's II- XII. Strength 5/5 upper and lower extremities bilaterally  Skin: No rashes  Psych: Normal affect and demeanor, alert and oriented x3    Data Reviewed:  I have personally reviewed following labs and imaging studies  Micro Results No results found for this or any previous visit (from the past 240 hour(s)).  Radiology Reports 14/10/13 Renal  Result Date: 12/05/2016 CLINICAL DATA:  Acute kidney  injury.  Left renal mass. EXAM: RENAL / URINARY TRACT ULTRASOUND COMPLETE COMPARISON:  None. FINDINGS: Right Kidney: Length: 11.8 cm. Mild increased parenchymal echogenicity with renal cortical thinning. Cortical cyst arises from the upper pole measuring 4.6 x 2.3 x 4.5 cm. Another cortical cyst arises from the lower pole measuring 2.9 x 1.9 x 3.2 cm. No other renal masses. No stones. No hydronephrosis. Left Kidney: Length: 8.5 cm. Mild increased parenchymal echogenicity with renal cortical thinning. 2 contiguous upper pole cysts, 1 measuring 2.8 x 3.0 x 2.9 cm and the other measuring 2.3 x 2.9 x 2.7 cm. Smaller lower pole cyst measuring 1.4 x 1.4 x 1.5 cm. No other renal masses, no stones and no hydronephrosis. Bladder: Appears normal for degree of bladder distention. IMPRESSION: 1. No acute findings.  No hydronephrosis. 2. Increased renal parenchymal echogenicity with renal cortical thinning on the left, consistent with medical renal disease. 3. Bilateral renal cysts. Electronically Signed   By: 02/02/2017 M.D.   On: 12/05/2016 08:58   Dg Chest Port 1 View  Result Date: 12/04/2016 CLINICAL DATA:  Dyspnea EXAM: PORTABLE CHEST 1 VIEW COMPARISON:  12/03/2016 FINDINGS: The lungs are hyperinflated. Calcified lung nodules are again visualized consistent with granuloma. Minimal atelectasis left base. No interval consolidation. No pneumothorax. Stable cardiomegaly with atherosclerosis.  Moderate hiatal hernia IMPRESSION: 1. Stable cardiomegaly without overt failure 2. Hiatal hernia 3. No acute infiltrate Electronically Signed   By: 01/31/2017 M.D.   On: 12/04/2016 23:37    Lab Data:  CBC:  Recent Labs Lab 12/04/16 2248  WBC 29.2*  NEUTROABS 28.0*  HGB 9.0*  HCT 26.6*  MCV 82.6  PLT 282   Basic Metabolic Panel:  Recent Labs Lab 12/04/16 2248 12/05/16 0455  NA 129* 130*  K 4.3 4.0  CL 97* 96*  CO2 19* 18*  GLUCOSE 122* 142*  BUN 71*  72*  CREATININE 3.85* 3.48*  CALCIUM 9.0 8.6*  MG  2.0  --    GFR: Estimated Creatinine Clearance: 11.7 mL/min (by C-G formula based on SCr of 3.48 mg/dL (H)). Liver Function Tests:  Recent Labs Lab 12/04/16 2248  AST 15  ALT 10*  ALKPHOS 83  BILITOT 0.4  PROT 6.4*  ALBUMIN 2.7*   No results for input(s): LIPASE, AMYLASE in the last 168 hours. No results for input(s): AMMONIA in the last 168 hours. Coagulation Profile: No results for input(s): INR, PROTIME in the last 168 hours. Cardiac Enzymes: No results for input(s): CKTOTAL, CKMB, CKMBINDEX, TROPONINI in the last 168 hours. BNP (last 3 results) No results for input(s): PROBNP in the last 8760 hours. HbA1C: No results for input(s): HGBA1C in the last 72 hours. CBG: No results for input(s): GLUCAP in the last 168 hours. Lipid Profile: No results for input(s): CHOL, HDL, LDLCALC, TRIG, CHOLHDL, LDLDIRECT in the last 72 hours. Thyroid Function Tests:  Recent Labs  12/04/16 2248  TSH 0.815   Anemia Panel: No results for input(s): VITAMINB12, FOLATE, FERRITIN, TIBC, IRON, RETICCTPCT in the last 72 hours. Urine analysis: No results found for: COLORURINE, APPEARANCEUR, LABSPEC, PHURINE, GLUCOSEU, HGBUR, BILIRUBINUR, KETONESUR, PROTEINUR, UROBILINOGEN, NITRITE, Hurshel Party M.D. [facility] 12/05/2016, 11:23 AM  Pager: [phone] Between 7am to 7pm - call Pager - [phone]  After 7pm go to [email or web address] - password [email or web address]  Call night coverage person covering after 7pm

## 2016-12-05 NOTE — Progress Notes (Addendum)
Initial Nutrition Assessment  DOCUMENTATION CODES:   Non-severe (moderate) malnutrition in context of chronic illness  INTERVENTION:   Ensure Enlive po BID, each supplement provides 350 kcal and 20 grams of protein  NUTRITION DIAGNOSIS:   Inadequate oral intake related to acute illness, poor appetite as evidenced by per patient/family report, moderate depletion of body fat, moderate depletions of muscle mass.  GOAL:   Patient will meet greater than or equal to 90% of their needs  MONITOR:   PO intake, Supplement acceptance  REASON FOR ASSESSMENT:   Malnutrition Screening Tool    ASSESSMENT:   83 y.o. female with medical history significant for rheumatoid arthritis, chronic kidney disease stage IV, hypertension, and chronic anemia who presents in transfer from [facility] where she had been admitted with a suspected infectious exacerbation of her chronic lung disease, and was found to have pericardial effusion.   Met with pt in room today. Pt reports decreasing appetite for several months but reports that she was still eating pta. Pt eating 50% meals currently. Pt reports stable weights. Pt does not eat any meat except for fish. Pt has never tried Ensure but is willing to try it. Will change supplements if pt does not like EE.   Medications reviewed and include: vit D, calcitriol, folic acid, heparin, synthroid, solu-medrol, protonix, sodium bicarbonate  Labs reviewed: Na 130(L), Cl 96(L), Co2 18(L), BUN 72(H), creat 3.48(H), Ca 8.6(L) adj. 9.64 wnl, Alb 2.7(L) Wbc- 29.2(H), Hgb 9.0(L), Hct 26.6(L)  Nutrition-Focused physical exam completed. Findings are moderate fat depletion, moderate  muscle depletion, and no edema.   Diet Order:  Diet regular Room service appropriate? Yes; Fluid consistency: Thin; Fluid restriction: 1500 mL Fluid  Skin:  Reviewed, no issues  Last BM:  1/5  Height:   Ht Readings from Last 1 Encounters:  12/04/16 _0  (1.702 m)    Weight:    Wt Readings from Last 1 Encounters:  12/05/16 127 lb 1.6 oz (57.7 kg)    Ideal Body Weight:  61.3 kg  BMI:  Body mass index is 19.91 kg/m.  Estimated Nutritional Needs:   Kcal:  1500-1800kcal/day   Protein:  69-80g/day   Fluid:  >1.5L/day   EDUCATION NEEDS:   No education needs identified at this time  Koleen Distance, RD, LDN Pager #[phone]

## 2016-12-06 ENCOUNTER — Other Ambulatory Visit (HOSPITAL_COMMUNITY): Payer: Medicare Other

## 2016-12-06 DIAGNOSIS — R0602 Shortness of breath: Secondary | ICD-10-CM

## 2016-12-06 LAB — CBC
HCT: 26 % — ABNORMAL LOW (ref 36.0–46.0)
Hemoglobin: 8.8 g/dL — ABNORMAL LOW (ref 12.0–15.0)
MCH: 27.8 pg (ref 26.0–34.0)
MCHC: 33.8 g/dL (ref 30.0–36.0)
MCV: 82.3 fL (ref 78.0–100.0)
PLATELETS: 225 10*3/uL (ref 150–400)
RBC: 3.16 MIL/uL — ABNORMAL LOW (ref 3.87–5.11)
RDW: 14.4 % (ref 11.5–15.5)
WBC: 19.2 10*3/uL — ABNORMAL HIGH (ref 4.0–10.5)

## 2016-12-06 LAB — BASIC METABOLIC PANEL
Anion gap: 12 (ref 5–15)
BUN: 85 mg/dL — AB (ref 6–20)
CHLORIDE: 96 mmol/L — AB (ref 101–111)
CO2: 22 mmol/L (ref 22–32)
CREATININE: 3.37 mg/dL — AB (ref 0.44–1.00)
Calcium: 8.6 mg/dL — ABNORMAL LOW (ref 8.9–10.3)
GFR calc Af Amer: 14 mL/min — ABNORMAL LOW (ref >60)
GFR calc non Af Amer: 12 mL/min — ABNORMAL LOW (ref >60)
GLUCOSE: 146 mg/dL — AB (ref 65–99)
Potassium: 4 mmol/L (ref 3.5–5.1)
Sodium: 130 mmol/L — ABNORMAL LOW (ref 135–145)

## 2016-12-06 LAB — GLUCOSE, CAPILLARY
GLUCOSE-CAPILLARY: 138 mg/dL — AB (ref 65–99)
GLUCOSE-CAPILLARY: 147 mg/dL — AB (ref 65–99)
Glucose-Capillary: 132 mg/dL — ABNORMAL HIGH (ref 65–99)
Glucose-Capillary: 134 mg/dL — ABNORMAL HIGH (ref 65–99)

## 2016-12-06 LAB — UREA NITROGEN, URINE: Urea Nitrogen, Ur: 479 mg/dL

## 2016-12-06 MED ORDER — IPRATROPIUM-ALBUTEROL 0.5-2.5 (3) MG/3ML IN SOLN: 3.0000 mL | Freq: Four times a day (QID) | RESPIRATORY_TRACT | Status: DC

## 2016-12-06 NOTE — Progress Notes (Signed)
[facility]                                                                              Patient Demographics  Rachel Johns, is a 83 y.o. female, DOB - 09/07/1934, QMG:[id number]  Admit date - 12/04/2016   Admitting Physician Briscoe Deutscher, MD  Outpatient Primary MD for the patient is Maryjean Ka, MD  Outpatient specialists:   LOS - 2  days    No chief complaint on file.      Brief summary   Rachel Johns is a 83 y.o. female with medical history significant for rheumatoid arthritis, chronic kidney disease stage IV, hypertension, and chronic anemia who presented in transfer from [facility] where she was admitted with a suspected infectious exacerbation of her chronic lung disease, and was found to have pericardial effusion. Patient was reportedly in her usual state of health, until a couple weeks ago when she noted the insidious development of generalized weakness, fatigue, dyspnea, and then more recently, subjective fevers. She was placed on Levaquin by her PCP for acute bronchitis, but actually continued to worsen despite completing a one week course. She then presented to [facility] where she was admitted, treated with empiric Rocephin and IV Solu-Medrol. Chest CT demonstrated extensive emphysematous changes without focal infiltrate. Echocardiogram was notable for a moderate generalized pericardial effusion without tamponade. She was noted to have acute kidney injury with serum creatinine of 3.8, up from her baseline of 2.7. The patient's rheumatologist was involved in her care with concern for rheumatic lung disease. There was concern that the patient may require a diagnostic pericardial fluid aspiration hence transferred to [facility].   Assessment & Plan     Pericardial effusion without tamponade  - Pt had TTE performed at [facility] with moderate-sized generalized pericardial effusion without tamponade  - was suspected secondary  to RA and/or uremia; her rheumatologist wanted a diagnostic pericardial effusion aspiration, so transfer to [facility] was arranged, however patient reports dyspnea and fatigue has been improving, may reflect resolving effusion hence repeat 2-D echo is ordered. - RA is being treated with steroids, uremia has worsened since admission to [facility]  - Repeat 2-D echo is still pending - Creatinine function also appears to be improving   Emphysema with acute exacerbation  - Pt was admitted to [facility] with worsening dyspnea, cough, subjective fevers despite treatment with a week of Levaquin outpatient  - Afebrile, continue IV steroids, Rocephin, had negative flu swab - CT chest demonstrated extensive emphysematous changes without acute infiltrate, Rheumatology was concerned for rheumatoid lung disease  - Of note, pt is eager to get back home to feed the birds in her backyard that "depend on her," and hypersensitivity pneumonitis is a consideration - Plan to continue IV steroids, difficult to obtain HRCT due to acute renal insufficiency on CKD stage IV due to contrast   Rheumatoid arthritis  - Follows with Dr. Kathi Ludwig, [phone], once 2-D echo results are available, I will discuss with patient's rheumatologist about further plan - Managed with steroids and Plaquenil, continue IV Solu-Medrol - Plaquenil held on admission given acute on chronic renal disease  -  Concern for lung involvement as above   Acute kidney injury superimposed on CKD stage IV - SCr was 3.8 on presentation to [facility] 12/04/15, up from reported baseline of 2.7  - Her CKD has been attributed to IgA nephropathy and she was previously on HD through AVF that no longer functions  - Creatinine function improving, continue to hold losartan, Lasix, and Plaquenil on admission - Hesitant to give fluid in setting of known CHF and elevated BNP    Hypertension  - Plan to continue Norvasc as tolerated  - Lasix and losartan held on admission in  setting of AKI     Hypothyroidism  - Appears stable, recent TSH wnl  - Continue Synthroid     Metabolic acidosis  - Mild, likely secondary to acute on chronic kidney disease  - Continue her bicarbonate therapy with 650 mg TID    Left renal mass  - Noted incidentally on chest CT at [facility] with [place], follow-up advised by radiology  - Renal ultrasound showed no acute findings, no hydronephrosis, medical renal disease   Leukocytosis - WBC 23,900 on admission, no fever and no apparent focus of infection  - Likely secondary to steroids    Hyponatremia  - Complicated by acute on chronic renal disease,  Continue NaHCO3 tabs TID   DVT prophylaxis: sq heparin   Code Status: full  DVT Prophylaxis:  heparin  Family Communication: Discussed in detail with the patient, all imaging results, lab results explained to the patient   Disposition Plan:   Time Spent in minutes  Procedures:    Consultants:     Antimicrobials:      Medications  Scheduled Meds: . allopurinol  100 mg Oral Daily  . amLODipine  5 mg Oral Daily  . [START ON 12/07/2016] calcitRIOL  0.25 mcg Oral Once per day on Mon Fri  . cholecalciferol  1,000 Units Oral Daily  . cloNIDine  0.2 mg Oral BID  . feeding supplement (ENSURE ENLIVE)  237 mL Oral BID BM  . folic acid  1 mg Oral Daily  . heparin  5,000 Units Subcutaneous Q8H  . levothyroxine  75 mcg Oral QAC breakfast  . methylPREDNISolone (SOLU-MEDROL) injection  60 mg Intravenous Q6H  . pantoprazole  40 mg Oral Daily  . sodium bicarbonate  650 mg Oral TID  . sodium chloride flush  3 mL Intravenous Q12H  . sodium chloride flush  3 mL Intravenous Q12H   Continuous Infusions: PRN Meds:.sodium chloride, acetaminophen, ondansetron **OR** ondansetron (ZOFRAN) IV, polyethylene glycol, sodium chloride flush, traMADol   Antibiotics   Anti-infectives    Start     Dose/Rate Route Frequency Ordered Stop   12/04/16 2359  doxycycline  (VIBRAMYCIN) 100 mg in dextrose 5 % 250 mL IVPB  Status:  Discontinued     100 mg 125 mL/hr over 120 Minutes Intravenous 2 times daily 12/04/16 2315 12/05/16 0230   12/04/16 2245  hydroxychloroquine (PLAQUENIL) tablet 200 mg  Status:  Discontinued     200 mg Oral 2 times daily 12/04/16 2224 12/05/16 0003        Subjective:   Latarcha Preast was seen and examined today. No complaints today feels a lot better, shortness of breath is improving Afebrile. No chest pain. Patient denies dizziness,  abdominal pain, N/V/D/C, new weakness, numbess, tingling. No acute events overnight.    Objective:   Vitals:   12/05/16 2052 12/06/16 0020 12/06/16 0628 12/06/16 1058  BP: 112/75 110/74 (!) 106/58 109/72  Pulse:  81 80 71 72  Resp: 19 19 19    Temp: 98.2 F (36.8 C) 98 F (36.7 C) 97.6 F (36.4 C)   TempSrc: Oral Oral Oral   SpO2: 98% 99% 99%   Weight:   57.4 kg (126 lb 9.6 oz)   Height:        Intake/Output Summary (Last 24 hours) at 12/06/16 1142 Last data filed at 12/06/16 02/03/17  Gross per 24 hour  Intake              900 ml  Output             1125 ml  Net             -225 ml     Wt Readings from Last 3 Encounters:  12/06/16 57.4 kg (126 lb 9.6 oz)  11/08/12 68 kg (150 lb)     Exam  General: Alert and oriented x 3, NAD  HEENT:   Neck:   Cardiovascular: S1 S2 clear, RRR  Respiratory: Clear to auscultation bilaterally, no wheezing, rales or rhonchi  Gastrointestinal: Soft, nontender, nondistended, + bowel sounds  Ext: no cyanosis clubbing or edema  Neuro: No new deficit  Skin: No rashes  Psych: Normal affect and demeanor, alert and oriented x3    Data Reviewed:  I have personally reviewed following labs and imaging studies  Micro Results No results found for this or any previous visit (from the past 240 hour(s)).  Radiology Reports 14/10/13 Renal  Result Date: 12/05/2016 CLINICAL DATA:  Acute kidney injury.  Left renal mass. EXAM: RENAL / URINARY TRACT ULTRASOUND  COMPLETE COMPARISON:  None. FINDINGS: Right Kidney: Length: 11.8 cm. Mild increased parenchymal echogenicity with renal cortical thinning. Cortical cyst arises from the upper pole measuring 4.6 x 2.3 x 4.5 cm. Another cortical cyst arises from the lower pole measuring 2.9 x 1.9 x 3.2 cm. No other renal masses. No stones. No hydronephrosis. Left Kidney: Length: 8.5 cm. Mild increased parenchymal echogenicity with renal cortical thinning. 2 contiguous upper pole cysts, 1 measuring 2.8 x 3.0 x 2.9 cm and the other measuring 2.3 x 2.9 x 2.7 cm. Smaller lower pole cyst measuring 1.4 x 1.4 x 1.5 cm. No other renal masses, no stones and no hydronephrosis. Bladder: Appears normal for degree of bladder distention. IMPRESSION: 1. No acute findings.  No hydronephrosis. 2. Increased renal parenchymal echogenicity with renal cortical thinning on the left, consistent with medical renal disease. 3. Bilateral renal cysts. Electronically Signed   By: 02/02/2017 M.D.   On: 12/05/2016 08:58   Dg Chest Port 1 View  Result Date: 12/04/2016 CLINICAL DATA:  Dyspnea EXAM: PORTABLE CHEST 1 VIEW COMPARISON:  12/03/2016 FINDINGS: The lungs are hyperinflated. Calcified lung nodules are again visualized consistent with granuloma. Minimal atelectasis left base. No interval consolidation. No pneumothorax. Stable cardiomegaly with atherosclerosis.  Moderate hiatal hernia IMPRESSION: 1. Stable cardiomegaly without overt failure 2. Hiatal hernia 3. No acute infiltrate Electronically Signed   By: 01/31/2017 M.D.   On: 12/04/2016 23:37    Lab Data:  CBC:  Recent Labs Lab 12/04/16 2248 12/06/16 0319  WBC 29.2* 19.2*  NEUTROABS 28.0*  --   HGB 9.0* 8.8*  HCT 26.6* 26.0*  MCV 82.6 82.3  PLT 282 225   Basic Metabolic Panel:  Recent Labs Lab 12/04/16 2248 12/05/16 0455 12/06/16 0319  NA 129* 130* 130*  K 4.3 4.0 4.0  CL 97* 96* 96*  CO2 19* 18* 22  GLUCOSE 122*  142* 146*  BUN 71* 72* 85*  CREATININE 3.85* 3.48* 3.37*   CALCIUM 9.0 8.6* 8.6*  MG 2.0  --   --    GFR: Estimated Creatinine Clearance: 12.1 mL/min (by C-G formula based on SCr of 3.37 mg/dL (H)). Liver Function Tests:  Recent Labs Lab 12/04/16 2248  AST 15  ALT 10*  ALKPHOS 83  BILITOT 0.4  PROT 6.4*  ALBUMIN 2.7*   No results for input(s): LIPASE, AMYLASE in the last 168 hours. No results for input(s): AMMONIA in the last 168 hours. Coagulation Profile: No results for input(s): INR, PROTIME in the last 168 hours. Cardiac Enzymes: No results for input(s): CKTOTAL, CKMB, CKMBINDEX, TROPONINI in the last 168 hours. BNP (last 3 results) No results for input(s): PROBNP in the last 8760 hours. HbA1C: No results for input(s): HGBA1C in the last 72 hours. CBG:  Recent Labs Lab 12/05/16 1232 12/05/16 1641 12/05/16 2207 12/06/16 0621 12/06/16 1129  GLUCAP 100* 124* 119* 132* 134*   Lipid Profile: No results for input(s): CHOL, HDL, LDLCALC, TRIG, CHOLHDL, LDLDIRECT in the last 72 hours. Thyroid Function Tests:  Recent Labs  12/04/16 2248  TSH 0.815   Anemia Panel: No results for input(s): VITAMINB12, FOLATE, FERRITIN, TIBC, IRON, RETICCTPCT in the last 72 hours. Urine analysis:    Component Value Date/Time   COLORURINE YELLOW 12/04/2016 1839   APPEARANCEUR CLEAR 12/04/2016 1839   LABSPEC 1.011 12/04/2016 1839   PHURINE 5.0 12/04/2016 1839   GLUCOSEU NEGATIVE 12/04/2016 1839   HGBUR NEGATIVE 12/04/2016 1839   BILIRUBINUR NEGATIVE 12/04/2016 1839   KETONESUR NEGATIVE 12/04/2016 1839   PROTEINUR NEGATIVE 12/04/2016 1839   NITRITE NEGATIVE 12/04/2016 1839   LEUKOCYTESUR NEGATIVE 12/04/2016 1839     Deeanna Beightol M.D. [facility] 12/06/2016, 11:42 AM  Pager: [phone] Between 7am to 7pm - call Pager - [phone]  After 7pm go to [email or web address] - password [email or web address]  Call night coverage person covering after 7pm

## 2016-12-07 ENCOUNTER — Inpatient Hospital Stay (HOSPITAL_COMMUNITY): Payer: Medicare Other

## 2016-12-07 DIAGNOSIS — R0600 Dyspnea, unspecified: Secondary | ICD-10-CM

## 2016-12-07 LAB — ECHOCARDIOGRAM COMPLETE
HEIGHTINCHES: 67 in
Weight: 2043.2 oz

## 2016-12-07 LAB — BASIC METABOLIC PANEL
ANION GAP: 13 (ref 5–15)
BUN: 98 mg/dL — ABNORMAL HIGH (ref 6–20)
CALCIUM: 8.8 mg/dL — AB (ref 8.9–10.3)
CO2: 21 mmol/L — ABNORMAL LOW (ref 22–32)
Chloride: 98 mmol/L — ABNORMAL LOW (ref 101–111)
Creatinine, Ser: 3.38 mg/dL — ABNORMAL HIGH (ref 0.44–1.00)
GFR, EST AFRICAN AMERICAN: 14 mL/min — AB (ref >60)
GFR, EST NON AFRICAN AMERICAN: 12 mL/min — AB (ref >60)
Glucose, Bld: 157 mg/dL — ABNORMAL HIGH (ref 65–99)
POTASSIUM: 3.7 mmol/L (ref 3.5–5.1)
SODIUM: 132 mmol/L — AB (ref 135–145)

## 2016-12-07 LAB — GLUCOSE, CAPILLARY
GLUCOSE-CAPILLARY: 141 mg/dL — AB (ref 65–99)
GLUCOSE-CAPILLARY: 132 mg/dL — AB (ref 65–99)
Glucose-Capillary: 154 mg/dL — ABNORMAL HIGH (ref 65–99)

## 2016-12-07 LAB — CBC
HEMATOCRIT: 26.1 % — AB (ref 36.0–46.0)
HEMOGLOBIN: 8.8 g/dL — AB (ref 12.0–15.0)
MCH: 28 pg (ref 26.0–34.0)
MCHC: 33.7 g/dL (ref 30.0–36.0)
MCV: 83.1 fL (ref 78.0–100.0)
Platelets: 224 10*3/uL (ref 150–400)
RBC: 3.14 MIL/uL — AB (ref 3.87–5.11)
RDW: 14.5 % (ref 11.5–15.5)
WBC: 11.8 10*3/uL — AB (ref 4.0–10.5)

## 2016-12-07 NOTE — Progress Notes (Signed)
Pt refused bed alarm. Education provided to pt stating it was due to her history of two falls in the last 6 months. Education provided to pt that it is for safety and risks explained such as falls and injury. Pt still refused.  Rachel Johns

## 2016-12-07 NOTE — Progress Notes (Addendum)
[facility]                                                                              Patient Demographics  Rachel Johns, is a 83 y.o. female, DOB - October 11, 1934, OZD:[id number]  Admit date - 12/04/2016   Admitting Physician Briscoe Deutscher, MD  Outpatient Primary MD for the patient is Maryjean Ka, MD  Outpatient specialists:   LOS - 3  days    No chief complaint on file.      Brief summary   Rachel Johns is a 83 y.o. female with medical history significant for rheumatoid arthritis, chronic kidney disease stage IV, hypertension, and chronic anemia who presented in transfer from [facility] where she was admitted with a suspected infectious exacerbation of her chronic lung disease, and was found to have pericardial effusion. Patient was reportedly in her usual state of health, until a couple weeks ago when she noted the insidious development of generalized weakness, fatigue, dyspnea, and then more recently, subjective fevers. She was placed on Levaquin by her PCP for acute bronchitis, but actually continued to worsen despite completing a one week course. She then presented to [facility] where she was admitted, treated with empiric Rocephin and IV Solu-Medrol. Chest CT demonstrated extensive emphysematous changes without focal infiltrate. Echocardiogram was notable for a moderate generalized pericardial effusion without tamponade. She was noted to have acute kidney injury with serum creatinine of 3.8, up from her baseline of 2.7. The patient's rheumatologist was involved in her care with concern for rheumatic lung disease. There was concern that the patient may require a diagnostic pericardial fluid aspiration hence transferred to [facility].   Assessment & Plan     Pericardial effusion without tamponade  - Pt had TTE performed at [facility] with moderate-sized generalized pericardial effusion without tamponade  - was suspected secondary  to RA and/or uremia; her rheumatologist wanted a diagnostic pericardial effusion aspiration, so transfer to [facility] was arranged, however patient reports dyspnea and fatigue has been improving, may reflect resolving effusion hence repeat 2-D echo is ordered. - RA is being treated with steroids, uremia has worsened since admission to [facility]  - Repeat 2-D echo done today, results pending, based on the echo results, will need further management - Creatinine function also appears to be stable and plateaued Addendum: 2:30pm Echo showed moderate pericardial effusion but no tamponade or hemodynamically destabilizing I called patient's rheumatologist Dr Kathi Ludwig, left message to call me back with Dr Clarise Cruz nurse. I am waiting for the response.  ADDENDUM: 5:50pm Just spoke with Dr Kathi Ludwig, she requested diagnostic and therapeutic pericardiocentesis, r/o viral, rheumatoid, uremic or any other cause  Paged on call cardiology, NPO after MN, will await cardiology response Will change to oral prednisone after pericardiocentesis  Addendum 6:15am Spoke with Dr Antoine Poche, cardiology will follow in am   Emphysema with acute exacerbation  - Pt was admitted to [facility] with worsening dyspnea, cough, subjective fevers despite treatment with a week of Levaquin outpatient  - Afebrile, continue IV steroids, Rocephin, had negative flu swab - CT chest demonstrated extensive emphysematous changes without acute infiltrate, Rheumatology was concerned  for rheumatoid lung disease  - Of note, pt is eager to get back home to feed the birds in her backyard that "depend on her," and hypersensitivity pneumonitis is a consideration - Plan to continue IV steroids, difficult to obtain HRCT due to acute renal insufficiency on CKD stage IV due to contrast   Rheumatoid arthritis  - Follows with Dr. Kathi Ludwig, [phone], once 2-D echo results are available, I will discuss with patient's rheumatologist about further plan once the 2-D echo results  are available - Managed with steroids and Plaquenil, continue IV Solu-Medrol - Plaquenil held on admission given acute on chronic renal disease  - Concern for lung involvement as above   Acute kidney injury superimposed on CKD stage IV - SCr was 3.8 on presentation to [facility] 12/04/15, up from reported baseline of 2.7  - Her CKD has been attributed to IgA nephropathy and she was previously on HD through [facility] that no longer functions  - Creatinine function improving, continue to hold losartan, Lasix, and Plaquenil on admission - Hesitant to give fluid in setting of known CHF and elevated BNP    Hypertension  - Plan to continue Norvasc as tolerated  - Lasix and losartan held on admission in setting of AKI     Hypothyroidism  - Appears stable, recent TSH wnl  - Continue Synthroid     Metabolic acidosis  - Mild, likely secondary to acute on chronic kidney disease  - Continue her bicarbonate therapy with 650 mg TID    Left renal mass  - Noted incidentally on chest CT at [facility] with [place], follow-up advised by radiology  - Renal ultrasound showed no acute findings, no hydronephrosis, medical renal disease   Leukocytosis - WBC 23,900 on admission, no fever and no apparent focus of infection  - Likely secondary to steroids    Hyponatremia  - Complicated by acute on chronic renal disease,  Continue NaHCO3 tabs TID   DVT prophylaxis: sq heparin   Code Status: full  DVT Prophylaxis:  heparin  Family Communication: Discussed in detail with the patient, all imaging results, lab results explained to the patient   Disposition Plan:   Time Spent in minutes  Procedures:    Consultants:     Antimicrobials:      Medications  Scheduled Meds: . allopurinol  100 mg Oral Daily  . amLODipine  5 mg Oral Daily  . calcitRIOL  0.25 mcg Oral Once per day on Mon Fri  . cholecalciferol  1,000 Units Oral Daily  . cloNIDine  0.2 mg Oral BID  . feeding supplement  (ENSURE ENLIVE)  237 mL Oral BID BM  . folic acid  1 mg Oral Daily  . heparin  5,000 Units Subcutaneous Q8H  . levothyroxine  75 mcg Oral QAC breakfast  . methylPREDNISolone (SOLU-MEDROL) injection  60 mg Intravenous Q6H  . pantoprazole  40 mg Oral Daily  . sodium bicarbonate  650 mg Oral TID  . sodium chloride flush  3 mL Intravenous Q12H  . sodium chloride flush  3 mL Intravenous Q12H   Continuous Infusions: PRN Meds:.sodium chloride, acetaminophen, ondansetron **OR** ondansetron (ZOFRAN) IV, polyethylene glycol, sodium chloride flush, traMADol   Antibiotics   Anti-infectives    Start     Dose/Rate Route Frequency Ordered Stop   12/04/16 2359  doxycycline (VIBRAMYCIN) 100 mg in dextrose 5 % 250 mL IVPB  Status:  Discontinued     100 mg 125 mL/hr over 120 Minutes Intravenous 2 times daily  12/04/16 2315 12/05/16 0230   12/04/16 2245  hydroxychloroquine (PLAQUENIL) tablet 200 mg  Status:  Discontinued     200 mg Oral 2 times daily 12/04/16 2224 12/05/16 0003        Subjective:   Marlaysia Lenig was seen and examined today.Denies any complaints, no shortness of breath, feels a lot better. No chest pain. Patient denies dizziness,  abdominal pain, N/V/D/C, new weakness, numbess, tingling. No acute events overnight.    Objective:   Vitals:   12/06/16 1058 12/06/16 1227 12/06/16 2153 12/07/16 0534  BP: 109/72 (!) 95/53 114/62 109/61  Pulse: 72 66 77 66  Resp:  18 18 18   Temp:  97.4 F (36.3 C) 97.7 F (36.5 C) 97.3 F (36.3 C)  TempSrc:  Oral Oral Oral  SpO2:  98% 98% 99%  Weight:    57.9 kg (127 lb 11.2 oz)  Height:        Intake/Output Summary (Last 24 hours) at 12/07/16 1107 Last data filed at 12/07/16 1000  Gross per 24 hour  Intake             1263 ml  Output             1100 ml  Net              163 ml     Wt Readings from Last 3 Encounters:  12/07/16 57.9 kg (127 lb 11.2 oz)  11/08/12 68 kg (150 lb)     Exam  General: Alert and oriented x 3, NAD  HEENT:    Neck:   Cardiovascular: S1 S2 clear, RRR  Respiratory: CTAB  Gastrointestinal: Soft, nontender, nondistended, + bowel sounds  Ext: no cyanosis clubbing or edema  Neuro: No new deficit  Skin: No rashes  Psych: Normal affect and demeanor, alert and oriented x3    Data Reviewed:  I have personally reviewed following labs and imaging studies  Micro Results No results found for this or any previous visit (from the past 240 hour(s)).  Radiology Reports 14/10/13 Renal  Result Date: 12/05/2016 CLINICAL DATA:  Acute kidney injury.  Left renal mass. EXAM: RENAL / URINARY TRACT ULTRASOUND COMPLETE COMPARISON:  None. FINDINGS: Right Kidney: Length: 11.8 cm. Mild increased parenchymal echogenicity with renal cortical thinning. Cortical cyst arises from the upper pole measuring 4.6 x 2.3 x 4.5 cm. Another cortical cyst arises from the lower pole measuring 2.9 x 1.9 x 3.2 cm. No other renal masses. No stones. No hydronephrosis. Left Kidney: Length: 8.5 cm. Mild increased parenchymal echogenicity with renal cortical thinning. 2 contiguous upper pole cysts, 1 measuring 2.8 x 3.0 x 2.9 cm and the other measuring 2.3 x 2.9 x 2.7 cm. Smaller lower pole cyst measuring 1.4 x 1.4 x 1.5 cm. No other renal masses, no stones and no hydronephrosis. Bladder: Appears normal for degree of bladder distention. IMPRESSION: 1. No acute findings.  No hydronephrosis. 2. Increased renal parenchymal echogenicity with renal cortical thinning on the left, consistent with medical renal disease. 3. Bilateral renal cysts. Electronically Signed   By: 02/02/2017 M.D.   On: 12/05/2016 08:58   Dg Chest Port 1 View  Result Date: 12/04/2016 CLINICAL DATA:  Dyspnea EXAM: PORTABLE CHEST 1 VIEW COMPARISON:  12/03/2016 FINDINGS: The lungs are hyperinflated. Calcified lung nodules are again visualized consistent with granuloma. Minimal atelectasis left base. No interval consolidation. No pneumothorax. Stable cardiomegaly with  atherosclerosis.  Moderate hiatal hernia IMPRESSION: 1. Stable cardiomegaly without overt failure 2. Hiatal hernia 3.  No acute infiltrate Electronically Signed   By: Jasmine Pang M.D.   On: 12/04/2016 23:37    Lab Data:  CBC:  Recent Labs Lab 12/04/16 2248 12/06/16 0319 12/07/16 0315  WBC 29.2* 19.2* 11.8*  NEUTROABS 28.0*  --   --   HGB 9.0* 8.8* 8.8*  HCT 26.6* 26.0* 26.1*  MCV 82.6 82.3 83.1  PLT 282 225 224   Basic Metabolic Panel:  Recent Labs Lab 12/04/16 2248 12/05/16 0455 12/06/16 0319 12/07/16 0315  NA 129* 130* 130* 132*  K 4.3 4.0 4.0 3.7  CL 97* 96* 96* 98*  CO2 19* 18* 22 21*  GLUCOSE 122* 142* 146* 157*  BUN 71* 72* 85* 98*  CREATININE 3.85* 3.48* 3.37* 3.38*  CALCIUM 9.0 8.6* 8.6* 8.8*  MG 2.0  --   --   --    GFR: Estimated Creatinine Clearance: 12.1 mL/min (by C-G formula based on SCr of 3.38 mg/dL (H)). Liver Function Tests:  Recent Labs Lab 12/04/16 2248  AST 15  ALT 10*  ALKPHOS 83  BILITOT 0.4  PROT 6.4*  ALBUMIN 2.7*   No results for input(s): LIPASE, AMYLASE in the last 168 hours. No results for input(s): AMMONIA in the last 168 hours. Coagulation Profile: No results for input(s): INR, PROTIME in the last 168 hours. Cardiac Enzymes: No results for input(s): CKTOTAL, CKMB, CKMBINDEX, TROPONINI in the last 168 hours. BNP (last 3 results) No results for input(s): PROBNP in the last 8760 hours. HbA1C: No results for input(s): HGBA1C in the last 72 hours. CBG:  Recent Labs Lab 12/06/16 0621 12/06/16 1129 12/06/16 1620 12/06/16 2211 12/07/16 0627  GLUCAP 132* 134* 138* 147* 141*   Lipid Profile: No results for input(s): CHOL, HDL, LDLCALC, TRIG, CHOLHDL, LDLDIRECT in the last 72 hours. Thyroid Function Tests:  Recent Labs  12/04/16 2248  TSH 0.815   Anemia Panel: No results for input(s): VITAMINB12, FOLATE, FERRITIN, TIBC, IRON, RETICCTPCT in the last 72 hours. Urine analysis:    Component Value Date/Time    COLORURINE YELLOW 12/04/2016 1839   APPEARANCEUR CLEAR 12/04/2016 1839   LABSPEC 1.011 12/04/2016 1839   PHURINE 5.0 12/04/2016 1839   GLUCOSEU NEGATIVE 12/04/2016 1839   HGBUR NEGATIVE 12/04/2016 1839   BILIRUBINUR NEGATIVE 12/04/2016 1839   KETONESUR NEGATIVE 12/04/2016 1839   PROTEINUR NEGATIVE 12/04/2016 1839   NITRITE NEGATIVE 12/04/2016 1839   LEUKOCYTESUR NEGATIVE 12/04/2016 1839     Ilay Capshaw M.D. [facility] 12/07/2016, 11:07 AM  Pager: [phone] Between 7am to 7pm - call Pager - [phone]  After 7pm go to [email or web address] - password [email or web address]  Call night coverage person covering after 7pm

## 2016-12-07 NOTE — Progress Notes (Signed)
Echocardiogram 2D Echocardiogram has been performed.  Rachel Johns 12/07/2016, 9:53 AM

## 2016-12-08 ENCOUNTER — Other Ambulatory Visit: Payer: Self-pay | Admitting: Cardiology

## 2016-12-08 DIAGNOSIS — I313 Pericardial effusion (noninflammatory): Secondary | ICD-10-CM

## 2016-12-08 DIAGNOSIS — I3139 Other pericardial effusion (noninflammatory): Secondary | ICD-10-CM

## 2016-12-08 DIAGNOSIS — J069 Acute upper respiratory infection, unspecified: Secondary | ICD-10-CM

## 2016-12-08 DIAGNOSIS — N184 Chronic kidney disease, stage 4 (severe): Secondary | ICD-10-CM

## 2016-12-08 DIAGNOSIS — N19 Unspecified kidney failure: Secondary | ICD-10-CM

## 2016-12-08 DIAGNOSIS — R0600 Dyspnea, unspecified: Secondary | ICD-10-CM

## 2016-12-08 LAB — GLUCOSE, CAPILLARY
GLUCOSE-CAPILLARY: 144 mg/dL — AB (ref 65–99)
Glucose-Capillary: 145 mg/dL — ABNORMAL HIGH (ref 65–99)
Glucose-Capillary: 152 mg/dL — ABNORMAL HIGH (ref 65–99)
Glucose-Capillary: 160 mg/dL — ABNORMAL HIGH (ref 65–99)
Glucose-Capillary: 193 mg/dL — ABNORMAL HIGH (ref 65–99)

## 2016-12-08 LAB — CBC
HEMATOCRIT: 27 % — AB (ref 36.0–46.0)
HEMOGLOBIN: 8.9 g/dL — AB (ref 12.0–15.0)
MCH: 27.6 pg (ref 26.0–34.0)
MCHC: 33 g/dL (ref 30.0–36.0)
MCV: 83.6 fL (ref 78.0–100.0)
Platelets: 252 10*3/uL (ref 150–400)
RBC: 3.23 MIL/uL — AB (ref 3.87–5.11)
RDW: 14.4 % (ref 11.5–15.5)
WBC: 10.2 10*3/uL (ref 4.0–10.5)

## 2016-12-08 LAB — BASIC METABOLIC PANEL
ANION GAP: 13 (ref 5–15)
BUN: 113 mg/dL — ABNORMAL HIGH (ref 6–20)
CHLORIDE: 98 mmol/L — AB (ref 101–111)
CO2: 22 mmol/L (ref 22–32)
Calcium: 9 mg/dL (ref 8.9–10.3)
Creatinine, Ser: 3.27 mg/dL — ABNORMAL HIGH (ref 0.44–1.00)
GFR calc non Af Amer: 12 mL/min — ABNORMAL LOW (ref >60)
GFR, EST AFRICAN AMERICAN: 14 mL/min — AB (ref >60)
Glucose, Bld: 156 mg/dL — ABNORMAL HIGH (ref 65–99)
POTASSIUM: 3.9 mmol/L (ref 3.5–5.1)
Sodium: 133 mmol/L — ABNORMAL LOW (ref 135–145)

## 2016-12-08 MED ORDER — PREDNISONE 10 MG PO TABS
60.0000 mg | ORAL_TABLET | Freq: Every day | ORAL | Status: DC
  Administered 2016-12-08 – 2016-12-09 (×2): 60 mg via ORAL
  Filled 2016-12-08 (×2): qty 1

## 2016-12-08 NOTE — Evaluation (Signed)
Physical Therapy Evaluation Patient Details Name: Rachel Johns MRN: [id number] DOB: 04/13/1934 Today's Date: 12/08/2016   History of Present Illness  83 y/o female, with h/o rheumatoid arthritis, stage IV CKD, HTN, emphysema and chronic anemia, who was transferred to [facility], from [facility], on 12/04/2016, for acute on chronic lung disease and moderate sized pericardial effusion  Clinical Impression  Patient demonstrates deficits in functional mobility as indicated below. Will benefit from continued skilled PT to address deficits and maximize function. Will see as indicated and progress as tolerated. Recommend d/c home with RW.    Follow Up Recommendations Supervision for mobility/OOB;No PT follow up    Equipment Recommendations  Rolling walker with 5" wheels    Recommendations for Other Services       Precautions / Restrictions Precautions Precautions: Fall Restrictions Weight Bearing Restrictions: No      Mobility  Bed Mobility Overal bed mobility: Modified Independent             General bed mobility comments: use of bed rail, increased time, no physical assist required  Transfers Overall transfer level: Modified independent Equipment used: None             General transfer comment: increased time to come to standing, no physical assist required  Ambulation/Gait Ambulation/Gait assistance: Min guard Ambulation Distance (Feet): 410 Feet Assistive device: None Gait Pattern/deviations: Step-through pattern;Drifts right/left;Narrow base of support;Scissoring (ocassional scissoring and instability) Gait velocity: decreased Gait velocity interpretation: Below normal speed for age/gender General Gait Details: patient with some instability during ambulation, ocassional scissoring gait but able to self correct. At times pateitn required use of UE support via railings. will trial RW  Stairs            Wheelchair Mobility    Modified Rankin (Stroke  Patients Only)       Balance Overall balance assessment: Needs assistance Sitting-balance support: Feet supported Sitting balance-Leahy Scale: Good     Standing balance support: During functional activity Standing balance-Leahy Scale: Fair                               Pertinent Vitals/Pain Pain Assessment: Faces Faces Pain Scale: Hurts a little bit Pain Location: knee pain Pain Descriptors / Indicators: Aching Pain Intervention(s): Monitored during session    Home Living Family/patient expects to be discharged to:: Private residence Living Arrangements: Spouse/significant other Available Help at Discharge: Family Type of Home: House Home Access: Stairs to enter Entrance Stairs-Rails: None Entrance Stairs-Number of Steps: 1 Home Layout: Two level;Able to live on main level with bedroom/bathroom Home Equipment: Cane - single point;Grab bars - tub/shower;Bedside commode      Prior Function Level of Independence: Independent         Comments: ocassional use of cane when arthritis gets bad     Hand Dominance   Dominant Hand: Right    Extremity/Trunk Assessment   Upper Extremity Assessment Upper Extremity Assessment: Overall WFL for tasks assessed    Lower Extremity Assessment Lower Extremity Assessment: Overall WFL for tasks assessed    Cervical / Trunk Assessment Cervical / Trunk Assessment: Kyphotic  Communication   Communication: No difficulties  Cognition Arousal/Alertness: Awake/alert Behavior During Therapy: WFL for tasks assessed/performed Overall Cognitive Status: Within Functional Limits for tasks assessed                      General Comments      Exercises  Assessment/Plan    PT Assessment Patient needs continued PT services  PT Problem List Decreased strength;Decreased activity tolerance;Decreased balance;Decreased mobility          PT Treatment Interventions DME instruction;Gait training;Functional  mobility training;Therapeutic activities;Therapeutic exercise;Stair training;Patient/family education    PT Goals (Current goals can be found in the Care Plan section)  Acute Rehab PT Goals Patient Stated Goal: to go home PT Goal Formulation: With patient Time For Goal Achievement: 12/22/16 Potential to Achieve Goals: Good    Frequency Min 3X/week   Barriers to discharge        Co-evaluation               End of Session Equipment Utilized During Treatment: Gait belt Activity Tolerance: Patient tolerated treatment well Patient left: in bed;with call bell/phone within reach Nurse Communication: Mobility status         Time: 4431-5400 PT Time Calculation (min) (ACUTE ONLY): 22 min   Charges:   PT Evaluation $PT Eval Moderate Complexity: 1 Procedure     PT G Codes:        Fabio Asa 12/19/16, 3:47 PM  Charlotte Crumb, PT DPT  [phone]

## 2016-12-08 NOTE — Progress Notes (Signed)
Paged cardiology regarding pt NPO status and possible procedure. MD returned page stated to order diet and continue with medications, no procedure scheduled at this time.   Rachel Johns

## 2016-12-08 NOTE — Consult Note (Signed)
Patient ID: Rachel Johns MRN: [id number], DOB/AGE: 02-07-1934   Admit date: 12/04/2016   Reason for Consult: Pericardial Effusion Requesting MD: Dr. Isidoro Donning, Internal Medicine   Primary Physician: Maryjean Ka, MD Primary Cardiologist: New (Dr. Duke Salvia)  Pt. Profile:  83 y/o female, with h/o rheumatoid arthritis, stage IV CKD, HTN, emphysema and chronic anemia, who was transferred to [facility], from [facility], on 12/04/2016, for acute on chronic lung disease and moderate sized pericardial effusion.    Problem List  Past Medical History:  Diagnosis Date  . Anemia   . Arthritis   . Chronic kidney disease   . Gout   . Hypertension     Past Surgical History:  Procedure Laterality Date  . ABDOMINAL HYSTERECTOMY    . AV FISTULA PLACEMENT  06/27/2009   right upper arm BC vein AVF  . CATARACT EXTRACTION, BILATERAL  2012  . JOINT REPLACEMENT  1995   right hip     Allergies  Allergies  Allergen Reactions  . Levaquin [Levofloxacin In D5w] Other (See Comments)    Per patient this caused insomnia   . Tape Other (See Comments)    Tears skin - please use paper tape    HPI   83 y/o female, with h/o rheumatoid arthritis, stage IV CKD, HTN, emphysema and chronic anemia, who was transferred to [facility], from [facility], on 12/04/2016.  She presented to [facility] with complaint of dyspnea, cough, subjective fever and generalized weakness and fatigue. She was initially placed on Levaquin by her primary care physician for suspected acute bronchitis, however symptoms failed to improve, prompting her to present to the ED. She was admitted to [facility] and treated with empiric Rocephin and IV Solu-Medrol. Chest CT demonstrated extensive emphysematous changes without focal infiltrate. 2D Echocardiogram was notable for a moderate generalized pericardial effusion w/o signs of tamponade. She was also noted to have an acute kidney injury with increase in SCr to 3.8 (baseline 2.7).  There is concern for rheumatic lung disease. She was subsequently transferred to [facility] for additional management/ work-up. Cardiology has been consulted for assistance with her pericardial effusion. Her rheumatologist, has requested a diagnostic pericardial effusion aspiration to help with work-up to r/o viral, rheumatoid, uremic or any other cause.   She is hemodynamically stable. BP is 124/65. HR in the 70s. She is anemic, but Hgb has remained stable, at 8.9. Scr is 3.27. Na is 133. She remains on IV steroids.   Pt notes subjective improvement in breathing. She still notes lake of energy but she denies dyspnea, syncope/ near syncope and chest pain.    Home Medications  Prior to Admission medications   Medication Sig Start Date End Date Taking? Authorizing Provider  acetaminophen (TYLENOL) 500 MG tablet Take 1,000 mg by mouth See admin instructions. Take 2 tablets (1000 mg) by mouth daily at bedtime, may also take 2 tablets during the day as needed for arthritis pain   Yes Historical Provider, MD  allopurinol (ZYLOPRIM) 100 MG tablet Take 100 mg by mouth daily.  10/16/12  Yes Historical Provider, MD  calcitRIOL (ROCALTROL) 0.25 MCG capsule Take 0.25 mcg by mouth daily. Monday and Friday 10/16/12  Yes Historical Provider, MD  cholecalciferol (VITAMIN D) 1000 UNITS tablet Take 1,000 Units by mouth daily with supper.    Yes Historical Provider, MD  cloNIDine (CATAPRES) 0.2 MG tablet Take 0.2 mg by mouth 2 (two) times daily with a meal.  09/10/12  Yes Historical Provider, MD  Epoetin Alfa (PROCRIT  IJ) Inject as directed as needed.   Yes Historical Provider, MD  folic acid (FOLVITE) 1 MG tablet Take 1 mg by mouth daily.   Yes Historical Provider, MD  furosemide (LASIX) 40 MG tablet Take 40 mg by mouth daily.  11/05/12  Yes Historical Provider, MD  hydroxychloroquine (PLAQUENIL) 200 MG tablet Take 100 mg by mouth 2 (two) times daily.  11/02/12  Yes Historical Provider, MD  levothyroxine (SYNTHROID,  LEVOTHROID) 75 MCG tablet Take 75 mcg by mouth at bedtime.  09/28/12  Yes Historical Provider, MD  losartan (COZAAR) 50 MG tablet Take 50 mg by mouth daily.  09/28/12  Yes Historical Provider, MD  methylPREDNISolone (MEDROL) 4 MG tablet Take 4 mg by mouth daily. Continuous course for arthritis pain 09/28/12  Yes Historical Provider, MD  ranitidine (ZANTAC) 75 MG tablet Take 75 mg by mouth 2 (two) times daily.   Yes Historical Provider, MD  sodium bicarbonate 650 MG tablet Take 650 mg by mouth daily.    Yes Historical Provider, MD   Hospital Meds  . allopurinol  100 mg Oral Daily  . amLODipine  5 mg Oral Daily  . calcitRIOL  0.25 mcg Oral Once per day on Mon Fri  . cholecalciferol  1,000 Units Oral Daily  . cloNIDine  0.2 mg Oral BID  . feeding supplement (ENSURE ENLIVE)  237 mL Oral BID BM  . folic acid  1 mg Oral Daily  . heparin  5,000 Units Subcutaneous Q8H  . levothyroxine  75 mcg Oral QAC breakfast  . methylPREDNISolone (SOLU-MEDROL) injection  60 mg Intravenous Q6H  . pantoprazole  40 mg Oral Daily  . sodium bicarbonate  650 mg Oral TID  . sodium chloride flush  3 mL Intravenous Q12H  . sodium chloride flush  3 mL Intravenous Q12H    Family History  Family History  Problem Relation Age of Onset  . Heart disease Mother   . Heart attack Father   . Cancer Sister   . Other Sister     varicose veins  . Diabetes Brother   . Heart attack Daughter     Social History  Social History   Social History  . Marital status: Widowed    Spouse name: N/A  . Number of children: N/A  . Years of education: N/A   Occupational History  . Not on file.   Social History Main Topics  . Smoking status: Never Smoker  . Smokeless tobacco: Never Used  . Alcohol use No  . Drug use: No  . Sexual activity: Not on file   Other Topics Concern  . Not on file   Social History Narrative  . No narrative on file     Review of Systems General:  No chills, fever, night sweats or weight  changes.  Cardiovascular:  No chest pain, dyspnea on exertion, edema, orthopnea, palpitations, paroxysmal nocturnal dyspnea. Dermatological: No rash, lesions/masses Respiratory: No cough, dyspnea Urologic: No hematuria, dysuria Abdominal:   No nausea, vomiting, diarrhea, bright red blood per rectum, melena, or hematemesis Neurologic:  No visual changes, wkns, changes in mental status. All other systems reviewed and are otherwise negative except as noted above.  Physical Exam  Blood pressure 124/65, pulse 72, temperature 97.7 F (36.5 C), temperature source Oral, resp. rate 17, height 5\' 7"  (1.702 m), weight 128 lb 11.2 oz (58.4 kg), SpO2 96 %.  General: Pleasant, NAD Psych: Normal affect. Neuro: Alert and oriented X 3. Moves all extremities spontaneously. HEENT: Normal  Neck:  Supple without bruits or JVD. Lungs:  Resp regular and unlabored, CTA. Heart: RRR no s3, s4, or murmurs. Abdomen: Soft, non-tender, non-distended, BS + x 4.  Extremities: No clubbing, cyanosis or edema. DP/PT/Radials 2+ and equal bilaterally.  Labs  Troponin (Point of Care Test) No results for input(s): TROPIPOC in the last 72 hours. No results for input(s): CKTOTAL, CKMB, TROPONINI in the last 72 hours. Lab Results  Component Value Date   WBC 10.2 12/08/2016   HGB 8.9 (L) 12/08/2016   HCT 27.0 (L) 12/08/2016   MCV 83.6 12/08/2016   PLT 252 12/08/2016    Recent Labs Lab 12/04/16 2248  12/08/16 0441  NA 129*  < > 133*  K 4.3  < > 3.9  CL 97*  < > 98*  CO2 19*  < > 22  BUN 71*  < > 113*  CREATININE 3.85*  < > 3.27*  CALCIUM 9.0  < > 9.0  PROT 6.4*  --   --   BILITOT 0.4  --   --   ALKPHOS 83  --   --   ALT 10*  --   --   AST 15  --   --   GLUCOSE 122*  < > 156*  < > = values in this interval not displayed. No results found for: CHOL, HDL, LDLCALC, TRIG No results found for: DDIMER   Radiology/Studies  US Renal  Result Date: 12/05/2016 CLINICAL DATA:  Acute kidney injury.  Left renal  mass. EXAM: RENAL / URINARY TRACT ULTRASOUND COMPLETE COMPARISON:  None. FINDINGS: Right Kidney: Length: 11.8 cm. Mild increased parenchymal echogenicity with renal cortical thinning. Cortical cyst arises from the upper pole measuring 4.6 x 2.3 x 4.5 cm. Another cortical cyst arises from the lower pole measuring 2.9 x 1.9 x 3.2 cm. No other renal masses. No stones. No hydronephrosis. Left Kidney: Length: 8.5 cm. Mild increased parenchymal echogenicity with renal cortical thinning. 2 contiguous upper pole cysts, 1 measuring 2.8 x 3.0 x 2.9 cm and the other measuring 2.3 x 2.9 x 2.7 cm. Smaller lower pole cyst measuring 1.4 x 1.4 x 1.5 cm. No other renal masses, no stones and no hydronephrosis. Bladder: Appears normal for degree of bladder distention. IMPRESSION: 1. No acute findings.  No hydronephrosis. 2. Increased renal parenchymal echogenicity with renal cortical thinning on the left, consistent with medical renal disease. 3. Bilateral renal cysts. Electronically Signed   By: Amie Portland M.D.   On: 12/05/2016 08:58   Dg Chest Port 1 View  Result Date: 12/04/2016 CLINICAL DATA:  Dyspnea EXAM: PORTABLE CHEST 1 VIEW COMPARISON:  12/03/2016 FINDINGS: The lungs are hyperinflated. Calcified lung nodules are again visualized consistent with granuloma. Minimal atelectasis left base. No interval consolidation. No pneumothorax. Stable cardiomegaly with atherosclerosis.  Moderate hiatal hernia IMPRESSION: 1. Stable cardiomegaly without overt failure 2. Hiatal hernia 3. No acute infiltrate Electronically Signed   By: Jasmine Pang M.D.   On: 12/04/2016 23:37    ECG  NSR  Echocardiogram 12-08-16  Study Conclusions  - Left ventricle: The cavity size was normal. Wall thickness was   normal. Systolic function was normal. The estimated ejection   fraction was in the range of 55% to 60%. Doppler parameters are   consistent with abnormal left ventricular relaxation (grade 1   diastolic dysfunction). - Aortic  valve: There was trivial regurgitation. - Left atrium: The atrium was mildly dilated. - Pericardium, extracardiac: Moderate pericardial effusion   surrounds heart. It does not appear  to be hemodynamiically   destabilizing by echo critieria. Clinical correlation indicated .   There was a left pleural effusion.   ASSESSMENT AND PLAN  Principal Problem:   Pericardial effusion without cardiac tamponade Active Problems:   Chronic kidney disease (CKD), stage IV (severe) (HCC)   Rheumatoid arthritis (HCC)   Uremia   Normocytic anemia   Leukocytosis   Metabolic acidosis   Acute URI   Essential hypertension   Acute kidney injury (HCC)   Hyperglycemia   Hyponatremia   Emphysema of lung (HCC)   Left renal mass   Dyspnea   Malnutrition of moderate degree   1. Pericardial Effusion: Moderate pericardial effusion noted on echo yesterday. It does not appear to be hemodynamically significant. Her BP is stable. Her dyspnea has improved with IV steroids. She denies any CP, syncope/ near syncope. She continues to note fatigue, however she also has chronic anemia with Hgb at 8.9. Her rheumatologist is concerned regarding the possibility of the development of rheumatic lung disease and has requested a diagnostic pericardiocentesis for fluid analysis to r/o viral, rheumatoid, uremic or any other cause.  MD to assess.   Additional Medical Problems including acute exacerbation of her emphysema, RA, AKI/ CKD and anemia are being managed by Internal Medicine.   Signed, Robbie Lis, PA-C 12/08/2016, 11:14 AM

## 2016-12-08 NOTE — Care Management Important Message (Signed)
Important Message  Patient Details  Name: Rachel Johns MRN: [id number] Date of Birth: Sep 21, 1934   Medicare Important Message Given:  Yes    Kinta Martis 12/08/2016, 11:01 AM

## 2016-12-08 NOTE — Progress Notes (Signed)
Talked to MD Rai in the hallway, stated sliding scale insulin not needed for patient.  Rachel Johns [name]

## 2016-12-08 NOTE — Progress Notes (Signed)
[facility]                                                                              Patient Demographics  Rachel Johns, is a 83 y.o. female, DOB - [birth year], XKG:[id number]  Admit date - 12/04/2016   Admitting Physician Briscoe Deutscher, MD  Outpatient Primary MD for the patient is Maryjean Ka, MD  Outpatient specialists:   LOS - 4  days    No chief complaint on file.      Brief summary   Rachel Johns is a 83 y.o. female with medical history significant for rheumatoid arthritis, chronic kidney disease stage IV, hypertension, and chronic anemia who presented in transfer from [facility] where she was admitted with a suspected infectious exacerbation of her chronic lung disease, and was found to have pericardial effusion. Patient was reportedly in her usual state of health, until a couple weeks ago when she noted the insidious development of generalized weakness, fatigue, dyspnea, and then more recently, subjective fevers. She was placed on Levaquin by her PCP for acute bronchitis, but actually continued to worsen despite completing a one week course. She then presented to [facility] where she was admitted, treated with empiric Rocephin and IV Solu-Medrol. Chest CT demonstrated extensive emphysematous changes without focal infiltrate. Echocardiogram was notable for a moderate generalized pericardial effusion without tamponade. She was noted to have acute kidney injury with serum creatinine of 3.8, up from her baseline of 2.7. The patient's rheumatologist was involved in her care with concern for rheumatic lung disease. There was concern that the patient may require a diagnostic pericardial fluid aspiration hence transferred to [facility].   Assessment & Plan     Pericardial effusion without tamponade  - Pt had TTE performed at [facility] with moderate-sized generalized pericardial effusion without tamponade  - was suspected secondary  to RA and/or uremia; her rheumatologist wanted a diagnostic pericardial effusion aspiration, so transfer to [facility] was arranged, however patient reports dyspnea and fatigue has been improving, may reflect resolving effusion hence repeat 2-D echo is ordered. - RA is being treated with steroids, uremia has worsened since admission to [facility]  - Repeat 2-D echo done today, results pending, based on the echo results, will need further management - Creatinine function also appears to be stable and plateaued - Echo showed moderate pericardial effusion but no tamponade or hemodynamically destabilizing - spoke with Dr Kathi Ludwig on 1/8, she requested diagnostic and therapeutic pericardiocentesis, r/o viral, rheumatoid, uremic or any other cause  - Appreciate cardiology recommendations, given there is no tamponade and she is improving, hemodynamically stable, O2 sats 96% on room air, recommended no urgent need of pericardiocentesis at this time. Recommended repeating echo in 2 weeks and will see outpatient in follow-up.   Emphysema with acute exacerbation  - Pt was admitted to [facility] with worsening dyspnea, cough, subjective fevers despite treatment with a week of Levaquin outpatient , flu PCR was negative - CT chest demonstrated extensive emphysematous changes without acute infiltrate, Rheumatology was concerned for rheumatoid lung disease  - Of note, pt is eager to get back home to feed the  birds in her backyard that "depend on her," and hypersensitivity pneumonitis is a consideration - difficult to obtain HRCT due to acute renal insufficiency on CKD stage IV due to contrast - DC IV steroids, start prednisone 60 mg daily today, will taper at the time of discharge tomorrow   Rheumatoid arthritis  - Follows with Dr. Kathi Ludwig, [phone], - Managed with steroids and Plaquenil, - Transition to oral prednisone, will taper at discharge - Plaquenil held on admission given acute on chronic renal disease   Acute  kidney injury superimposed on CKD stage IV - SCr was 3.8 on presentation to [facility] 12/04/15, up from reported baseline of 2.7 , improving - Her CKD has been attributed to IgA nephropathy and she was previously on HD through [facility] that no longer functions  - Creatinine function improving, continue to hold losartan, Lasix, and Plaquenil on admission - Hesitant to give fluid in setting of known CHF and elevated BNP    Hypertension  - Plan to continue Norvasc as tolerated  - Lasix and losartan held on admission in setting of AKI     Hypothyroidism  - Appears stable, recent TSH wnl  - Continue Synthroid     Metabolic acidosis  - Mild, likely secondary to acute on chronic kidney disease  - Continue her bicarbonate therapy with 650 mg TID    Left renal mass  - Noted incidentally on chest CT at [facility] with [place], follow-up advised by radiology  - Renal ultrasound showed no acute findings, no hydronephrosis, medical renal disease   Leukocytosis - Resolved - WBC 23,900 on admission, no fever and no apparent focus of infection  - Likely secondary to steroids    Hyponatremia  - Complicated by acute on chronic renal disease,  Continue NaHCO3 tabs TID   DVT prophylaxis: sq heparin   Code Status: full  DVT Prophylaxis:  heparin  Family Communication: Discussed in detail with the patient, all imaging results, lab results explained to the patient   Disposition Plan: DC home in a.m.  Time Spent in minutes  Procedures:    Consultants:   Cardiology   Antimicrobials:      Medications  Scheduled Meds: . allopurinol  100 mg Oral Daily  . amLODipine  5 mg Oral Daily  . calcitRIOL  0.25 mcg Oral Once per day on Mon Fri  . cholecalciferol  1,000 Units Oral Daily  . cloNIDine  0.2 mg Oral BID  . feeding supplement (ENSURE ENLIVE)  237 mL Oral BID BM  . folic acid  1 mg Oral Daily  . heparin  5,000 Units Subcutaneous Q8H  . levothyroxine  75 mcg Oral QAC  breakfast  . pantoprazole  40 mg Oral Daily  . predniSONE  60 mg Oral Q breakfast  . sodium bicarbonate  650 mg Oral TID  . sodium chloride flush  3 mL Intravenous Q12H  . sodium chloride flush  3 mL Intravenous Q12H   Continuous Infusions: PRN Meds:.sodium chloride, acetaminophen, ondansetron **OR** ondansetron (ZOFRAN) IV, polyethylene glycol, sodium chloride flush, traMADol   Antibiotics   Anti-infectives    Start     Dose/Rate Route Frequency Ordered Stop   12/04/16 2359  doxycycline (VIBRAMYCIN) 100 mg in dextrose 5 % 250 mL IVPB  Status:  Discontinued     100 mg 125 mL/hr over 120 Minutes Intravenous 2 times daily 12/04/16 2315 12/05/16 0230   12/04/16 2245  hydroxychloroquine (PLAQUENIL) tablet 200 mg  Status:  Discontinued     200  mg Oral 2 times daily 12/04/16 2224 12/05/16 0003        Subjective:   Rachel Johns was seen and examined today. Feels a lot better, no chest pain, shortness of breath has been progressively improving.  Patient denies dizziness,  abdominal pain, N/V/D/C, new weakness, numbess, tingling. No acute events overnight.    Objective:   Vitals:   12/07/16 0534 12/07/16 1249 12/07/16 2050 12/08/16 0554  BP: 109/61 (!) 107/53 120/67 124/65  Pulse: 66 72 71 72  Resp: 18 18 17    Temp: 97.3 F (36.3 C) 97.8 F (36.6 C) 97.6 F (36.4 C) 97.7 F (36.5 C)  TempSrc: Oral Oral Oral Oral  SpO2: 99% 99% 98% 96%  Weight: 57.9 kg (127 lb 11.2 oz)   58.4 kg (128 lb 11.2 oz)  Height:        Intake/Output Summary (Last 24 hours) at 12/08/16 1404 Last data filed at 12/08/16 1100  Gross per 24 hour  Intake              603 ml  Output             1200 ml  Net             -597 ml     Wt Readings from Last 3 Encounters:  12/08/16 58.4 kg (128 lb 11.2 oz)  11/08/12 68 kg (150 lb)     Exam  General: Alert and oriented x 3, NAD  HEENT:   Neck:   Cardiovascular: S1 S2 clear, RRR  Respiratory: CTAB  Gastrointestinal: Soft, nontender,  nondistended, + bowel sounds  Ext: no cyanosis clubbing or edema  Neuro: No new deficit  Skin: No rashes  Psych: Normal affect and demeanor, alert and oriented x3    Data Reviewed:  I have personally reviewed following labs and imaging studies  Micro Results No results found for this or any previous visit (from the past 240 hour(s)).  Radiology Reports 14/10/13 Renal  Result Date: 12/05/2016 CLINICAL DATA:  Acute kidney injury.  Left renal mass. EXAM: RENAL / URINARY TRACT ULTRASOUND COMPLETE COMPARISON:  None. FINDINGS: Right Kidney: Length: 11.8 cm. Mild increased parenchymal echogenicity with renal cortical thinning. Cortical cyst arises from the upper pole measuring 4.6 x 2.3 x 4.5 cm. Another cortical cyst arises from the lower pole measuring 2.9 x 1.9 x 3.2 cm. No other renal masses. No stones. No hydronephrosis. Left Kidney: Length: 8.5 cm. Mild increased parenchymal echogenicity with renal cortical thinning. 2 contiguous upper pole cysts, 1 measuring 2.8 x 3.0 x 2.9 cm and the other measuring 2.3 x 2.9 x 2.7 cm. Smaller lower pole cyst measuring 1.4 x 1.4 x 1.5 cm. No other renal masses, no stones and no hydronephrosis. Bladder: Appears normal for degree of bladder distention. IMPRESSION: 1. No acute findings.  No hydronephrosis. 2. Increased renal parenchymal echogenicity with renal cortical thinning on the left, consistent with medical renal disease. 3. Bilateral renal cysts. Electronically Signed   By: 02/02/2017 M.D.   On: 12/05/2016 08:58   Dg Chest Port 1 View  Result Date: 12/04/2016 CLINICAL DATA:  Dyspnea EXAM: PORTABLE CHEST 1 VIEW COMPARISON:  12/03/2016 FINDINGS: The lungs are hyperinflated. Calcified lung nodules are again visualized consistent with granuloma. Minimal atelectasis left base. No interval consolidation. No pneumothorax. Stable cardiomegaly with atherosclerosis.  Moderate hiatal hernia IMPRESSION: 1. Stable cardiomegaly without overt failure 2. Hiatal hernia 3. No  acute infiltrate Electronically Signed   By: 01/31/2017.D.  On: 12/04/2016 23:37    Lab Data:  CBC:  Recent Labs Lab 12/04/16 2248 12/06/16 0319 12/07/16 0315 12/08/16 0441  WBC 29.2* 19.2* 11.8* 10.2  NEUTROABS 28.0*  --   --   --   HGB 9.0* 8.8* 8.8* 8.9*  HCT 26.6* 26.0* 26.1* 27.0*  MCV 82.6 82.3 83.1 83.6  PLT 282 225 224 252   Basic Metabolic Panel:  Recent Labs Lab 12/04/16 2248 12/05/16 0455 12/06/16 0319 12/07/16 0315 12/08/16 0441  NA 129* 130* 130* 132* 133*  K 4.3 4.0 4.0 3.7 3.9  CL 97* 96* 96* 98* 98*  CO2 19* 18* 22 21* 22  GLUCOSE 122* 142* 146* 157* 156*  BUN 71* 72* 85* 98* 113*  CREATININE 3.85* 3.48* 3.37* 3.38* 3.27*  CALCIUM 9.0 8.6* 8.6* 8.8* 9.0  MG 2.0  --   --   --   --    GFR: Estimated Creatinine Clearance: 12.7 mL/min (by C-G formula based on SCr of 3.27 mg/dL (H)). Liver Function Tests:  Recent Labs Lab 12/04/16 2248  AST 15  ALT 10*  ALKPHOS 83  BILITOT 0.4  PROT 6.4*  ALBUMIN 2.7*   No results for input(s): LIPASE, AMYLASE in the last 168 hours. No results for input(s): AMMONIA in the last 168 hours. Coagulation Profile: No results for input(s): INR, PROTIME in the last 168 hours. Cardiac Enzymes: No results for input(s): CKTOTAL, CKMB, CKMBINDEX, TROPONINI in the last 168 hours. BNP (last 3 results) No results for input(s): PROBNP in the last 8760 hours. HbA1C: No results for input(s): HGBA1C in the last 72 hours. CBG:  Recent Labs Lab 12/07/16 1157 12/07/16 1620 12/08/16 0017 12/08/16 0551 12/08/16 1138  GLUCAP 154* 132* 145* 160* 152*   Lipid Profile: No results for input(s): CHOL, HDL, LDLCALC, TRIG, CHOLHDL, LDLDIRECT in the last 72 hours. Thyroid Function Tests: No results for input(s): TSH, T4TOTAL, FREET4, T3FREE, THYROIDAB in the last 72 hours. Anemia Panel: No results for input(s): VITAMINB12, FOLATE, FERRITIN, TIBC, IRON, RETICCTPCT in the last 72 hours. Urine analysis:    Component  Value Date/Time   COLORURINE YELLOW 12/04/2016 1839   APPEARANCEUR CLEAR 12/04/2016 1839   LABSPEC 1.011 12/04/2016 1839   PHURINE 5.0 12/04/2016 1839   GLUCOSEU NEGATIVE 12/04/2016 1839   HGBUR NEGATIVE 12/04/2016 1839   BILIRUBINUR NEGATIVE 12/04/2016 1839   KETONESUR NEGATIVE 12/04/2016 1839   PROTEINUR NEGATIVE 12/04/2016 1839   NITRITE NEGATIVE 12/04/2016 1839   LEUKOCYTESUR NEGATIVE 12/04/2016 1839     Decklin Weddington M.D. [facility] 12/08/2016, 2:04 PM  Pager: [phone] Between 7am to 7pm - call Pager - [phone]  After 7pm go to [email or web address] - password [email or web address]  Call night coverage person covering after 7pm

## 2016-12-09 ENCOUNTER — Telehealth: Payer: Self-pay | Admitting: *Deleted

## 2016-12-09 ENCOUNTER — Other Ambulatory Visit: Payer: Self-pay | Admitting: Cardiology

## 2016-12-09 LAB — BASIC METABOLIC PANEL
ANION GAP: 10 (ref 5–15)
Anion gap: 11 (ref 5–15)
BUN: 117 mg/dL — ABNORMAL HIGH (ref 6–20)
BUN: 117 mg/dL — ABNORMAL HIGH (ref 6–20)
CALCIUM: 9.1 mg/dL (ref 8.9–10.3)
CALCIUM: 9 mg/dL (ref 8.9–10.3)
CHLORIDE: 99 mmol/L — AB (ref 101–111)
CO2: 24 mmol/L (ref 22–32)
CO2: 23 mmol/L (ref 22–32)
CREATININE: 2.94 mg/dL — AB (ref 0.44–1.00)
Chloride: 99 mmol/L — ABNORMAL LOW (ref 101–111)
Creatinine, Ser: 2.98 mg/dL — ABNORMAL HIGH (ref 0.44–1.00)
GFR calc Af Amer: 16 mL/min — ABNORMAL LOW (ref >60)
GFR calc non Af Amer: 14 mL/min — ABNORMAL LOW (ref >60)
GFR calc non Af Amer: 14 mL/min — ABNORMAL LOW (ref >60)
GFR, EST AFRICAN AMERICAN: 16 mL/min — AB (ref >60)
GLUCOSE: 142 mg/dL — AB (ref 65–99)
GLUCOSE: 142 mg/dL — AB (ref 65–99)
POTASSIUM: 3.7 mmol/L (ref 3.5–5.1)
Potassium: 3.7 mmol/L (ref 3.5–5.1)
Sodium: 133 mmol/L — ABNORMAL LOW (ref 135–145)
Sodium: 133 mmol/L — ABNORMAL LOW (ref 135–145)

## 2016-12-09 LAB — GLUCOSE, CAPILLARY
Glucose-Capillary: 142 mg/dL — ABNORMAL HIGH (ref 65–99)
Glucose-Capillary: 144 mg/dL — ABNORMAL HIGH (ref 65–99)

## 2016-12-09 MED ORDER — PREDNISONE 10 MG PO TABS: ORAL_TABLET | ORAL | 0 refills | Status: DC

## 2016-12-09 MED ORDER — METHYLPREDNISOLONE 4 MG PO TABS: 4.0000 mg | ORAL_TABLET | Freq: Every day | ORAL | Status: AC

## 2016-12-09 MED ORDER — AMLODIPINE BESYLATE 5 MG PO TABS: 5.0000 mg | ORAL_TABLET | Freq: Every day | ORAL | 3 refills | Status: DC

## 2016-12-09 MED ORDER — DARBEPOETIN ALFA 25 MCG/0.42ML IJ SOSY
25.0000 ug | PREFILLED_SYRINGE | Freq: Once | INTRAMUSCULAR | Status: AC
  Administered 2016-12-09: 25 ug via SUBCUTANEOUS
  Filled 2016-12-09: qty 0.42

## 2016-12-09 NOTE — Progress Notes (Addendum)
Physical Therapy Treatment Patient Details Name: Rachel Johns MRN: [id number] DOB: Sep 01, 1934 Today's Date: 12/09/2016    History of Present Illness 83 y/o female, with h/o rheumatoid arthritis, stage IV CKD, HTN, emphysema and chronic anemia, who was transferred to [facility], on 12/04/2016, for acute on chronic lung disease and moderate sized pericardial effusion    PT Comments    Patient seen for mobility progression and stair negoitation in preparation for d/c home. Patient tolerated well but does demonstrate continued instability. Recommend HHPT and use of RW at home for mobility and supervision during stair negotiation. Patient receptive.  Follow Up Recommendations  Supervision for mobility/OOB;Home health PT    Equipment Recommendations  Rolling walker with 5" wheels    Recommendations for Other Services       Precautions / Restrictions Precautions Precautions: Fall Restrictions Weight Bearing Restrictions: No    Mobility  Bed Mobility Overal bed mobility: Modified Independent             General bed mobility comments: use of bed rail, increased time, no physical assist required  Transfers Overall transfer level: Modified independent Equipment used: None             General transfer comment: increased time to come to standing, no physical assist required  Ambulation/Gait Ambulation/Gait assistance: Min guard Ambulation Distance (Feet): 140 Feet Assistive device: None Gait Pattern/deviations: Step-through pattern;Drifts right/left;Narrow base of support;Scissoring Gait velocity: decreased Gait velocity interpretation: Below normal speed for age/gender General Gait Details: patient with some instability during ambulation, ocassional scissoring gait but able to self correct. At times pateitn required use of UE support via railings. will trial RW   Stairs Stairs: Yes   Stair Management: One rail Right Number of Stairs: 4 (performed  x2) General stair comments: performed stair negotiation with step to method and heavy reliance on rail for safety and support. Patient with increased time to perform. min guard for stability. noted RLE weakness during descent  Wheelchair Mobility    Modified Rankin (Stroke Patients Only)       Balance Overall balance assessment: Needs assistance Sitting-balance support: Feet supported Sitting balance-Leahy Scale: Good     Standing balance support: During functional activity Standing balance-Leahy Scale: Fair                      Cognition Arousal/Alertness: Awake/alert Behavior During Therapy: WFL for tasks assessed/performed Overall Cognitive Status: Within Functional Limits for tasks assessed                      Exercises      General Comments        Pertinent Vitals/Pain Pain Assessment: Faces Faces Pain Scale: Hurts a little bit Pain Location: arthiritic knee pain Pain Descriptors / Indicators: Aching Pain Intervention(s): Monitored during session    Home Living                      Prior Function            PT Goals (current goals can now be found in the care plan section) Acute Rehab PT Goals Patient Stated Goal: to go home PT Goal Formulation: With patient Time For Goal Achievement: 12/22/16 Potential to Achieve Goals: Good Progress towards PT goals: Progressing toward goals    Frequency    Min 3X/week      PT Plan Current plan remains appropriate    Co-evaluation  End of Session Equipment Utilized During Treatment: Gait belt Activity Tolerance: Patient tolerated treatment well Patient left: in bed;with call bell/phone within reach (sitting EOB)     Time: 7793-9030 PT Time Calculation (min) (ACUTE ONLY): 21 min  Charges:  $Gait Training: 8-22 mins                    G Codes:      Fabio Asa 12-26-2016, 5:16 PM  Charlotte Crumb, PT DPT  [phone]

## 2016-12-09 NOTE — Progress Notes (Signed)
Patient is alert and oriented, vital signs are stable, discharge instructions reviewed with patient, patient to follow up with cardiologist as outpatient, prescription given , questions and concerns addressed Stanford Breed Rn 12:50 PM 12-09-2016

## 2016-12-09 NOTE — Discharge Summary (Signed)
Physician Discharge Summary   Patient ID: Rachel Johns MRN: [id number] DOB/AGE: 1934/11/16 83 y.o.  Admit date: 12/04/2016 Discharge date: 12/09/2016  Primary Care Physician:  Rachel Ka, MD  Discharge Diagnoses:   . Pericardial effusion . Uremia . Acute on Chronic kidney disease (CKD), stage IV (severe) (HCC) . Rheumatoid arthritis (HCC) . Normocytic anemia . Leukocytosis . Metabolic acidosis . Acute URI . Essential hypertension . Hyponatremia . Emphysema of lung [facility])    Consults:  Cardiology  Recommendations for Outpatient Follow-up:  1. [facility] cardiology has scheduled echo in 2 weeks and follow-up appointment with Dr. Duke Salvia  2. Please repeat CBC/BMET at next visit for renal function and hemoglobin   DIET: Heart healthy diet    Allergies:   Allergies  Allergen Reactions  . Levaquin [Levofloxacin In D5w] Other (See Comments)    Per patient this caused insomnia   . Tape Other (See Comments)    Tears skin - please use paper tape     DISCHARGE MEDICATIONS: Current Discharge Medication List    START taking these medications   Details  predniSONE (DELTASONE) 10 MG tablet Prednisone dosing: Take  Prednisone 40mg  (4 tabs) x 1 week, then taper to 30mg  (3 tabs) x 1 week, then 20mg  (2 tabs) x 1 week, then 10mg  (1 tab) x 1 week. Further instructions about the taper per your rheumatologist. Qty: 70 tablet, Refills: 0      CONTINUE these medications which have CHANGED   Details  amLODipine (NORVASC) 5 MG tablet Take 1 tablet (5 mg total) by mouth daily. Qty: 30 tablet, Refills: 3    methylPREDNISolone (MEDROL) 4 MG tablet Take 1 tablet (4 mg total) by mouth daily. Continuous course for arthritis pain. HOLD WHILE YOU ARE ON HIGHER DOSE PREDNISONE AND TAPER IS COMPLETED.      CONTINUE these medications which have NOT CHANGED   Details  acetaminophen (TYLENOL) 500 MG tablet Take 1,000 mg by mouth See admin instructions. Take 2 tablets (1000 mg) by mouth  daily at bedtime, may also take 2 tablets during the day as needed for arthritis pain    allopurinol (ZYLOPRIM) 100 MG tablet Take 100 mg by mouth daily.     calcitRIOL (ROCALTROL) 0.25 MCG capsule Take 0.25 mcg by mouth daily. Monday and Friday    cholecalciferol (VITAMIN D) 1000 UNITS tablet Take 1,000 Units by mouth daily with supper.     cloNIDine (CATAPRES) 0.2 MG tablet Take 0.2 mg by mouth 2 (two) times daily with a meal.     Epoetin Alfa (PROCRIT IJ) Inject as directed as needed.    folic acid (FOLVITE) 1 MG tablet Take 1 mg by mouth daily.    levothyroxine (SYNTHROID, LEVOTHROID) 75 MCG tablet Take 75 mcg by mouth at bedtime.     ranitidine (ZANTAC) 75 MG tablet Take 75 mg by mouth 2 (two) times daily.    sodium bicarbonate 650 MG tablet Take 650 mg by mouth daily.       STOP taking these medications     furosemide (LASIX) 40 MG tablet      hydroxychloroquine (PLAQUENIL) 200 MG tablet      losartan (COZAAR) 50 MG tablet      Lansoprazole (PREVACID PO)      traMADol (ULTRAM) 50 MG tablet          Brief H and P: For complete details please refer to admission H and P, but in briefAlice Rose Popeis a 82 y.o.femalewith medical history significant forrheumatoid arthritis,  chronic kidney disease stage IV, hypertension, and chronic anemia who presented in transfer from [facility] where she was admitted with a suspected infectious exacerbation of her chronic lung disease, and was found to have pericardial effusion. Patient was reportedly in her usual state of health, until a couple weeks ago when she noted the insidious development of generalized weakness, fatigue, dyspnea, and then more recently, subjective fevers. She was placed on Levaquin by her PCP for acute bronchitis, but actually continued to worsen despite completing a one week course. She then presented to [facility] where she was admitted, treated with empiric Rocephin and IV Solu-Medrol. Chest CT  demonstrated extensive emphysematous changes without focal infiltrate. Echocardiogram was notable for a moderate generalized pericardial effusion without tamponade. She was noted to have acute kidney injury with serum creatinine of 3.8, up from her baseline of 2.7. The patient's rheumatologist was involved in her care with concern for rheumatic lung disease. There was concern that the patient may require a diagnostic pericardial fluid aspiration hence transferred to [facility] Course:   Pericardial effusion without tamponade  - Pt had TTE performed at [facility] with moderate-sized generalized pericardial effusion without tamponade. It was suspected secondary to RA and/or uremia; her rheumatologist wanted a diagnostic pericardial effusion aspiration, so transfer to [facility] was arranged, however patient reported that the dyspnea and fatigue has significantly improved, may reflect resolving effusion hence repeat 2-D echo was ordered. - RA is being treated with steroids, uremia had worsened since admission to [facility]  - Repeat 2-D echo done on 1/8, showed moderate pericardial effusion but no tamponade or hemodynamically destabilizing. I spoke with Dr Kathi Ludwig (patient's rheumatologist) on 1/8, she requested diagnostic and therapeutic pericardiocentesis, r/o viral, rheumatoid, uremic or any other cause  -  cardiology was consulted, patient was seen by Dr. Duke Salvia; given there is no tamponade and she is improving, hemodynamically stable, O2 sats 96% on room air, recommended no urgent need of pericardiocentesis at this time. Recommended repeating echo in 2 weeks and will see outpatient in follow-up, scheduled by [facility] office.   Emphysema with acute exacerbation  - Pt was admitted to [facility] with worsening dyspnea, cough, subjective fevers despite treatment with a week of Levaquin outpatient , flu PCR was negative - CT chest demonstrated extensive emphysematous changes without  acute infiltrate, Rheumatology was concerned for rheumatoid lung disease  - Of note, pt is eager to get back home to feed the birds in her backyard that "depend on her," and hypersensitivity pneumonitis is also a consideration - difficult to obtain HRCT due to acute renal insufficiency on CKD stage IV due to contrast -  patient was placed on high-dose IV steroids at [place]. She was transitioned to oral prednisone on 1/9 , she has continued to improve. She will be discharged on oral prednisone taper weekly.   Rheumatoid arthritis  - Follows with Dr. Kathi Ludwig, [phone], - Managed with steroids and Plaquenil, - patient was placed on high-dose IV steroids at [facility]. She was transitioned to oral prednisone on 1/9 , she has continued to improve. She will be discharged on oral prednisone taper weekly. Patient has scheduled follow-up with her rheumatologist - Plaquenil held on admission given acute on chronic renal disease, may resume once creatinine function has returned back to baseline.    Acute kidney injury superimposed on CKD stage IV - SCr was 3.8 on presentation to [facility] 12/04/15, up from reported baseline of 2.7 , improving, 2.9 at the time of  discharge. - Her CKD has been attributed to IgA nephropathy and she was previously on HD through [facility] that no longer functions  - Creatinine function improving, continue to hold losartan, Lasix, and Plaquenil on admission - Hesitant to give fluid in setting of known CHF and elevated BNP    Hypertension  - Plan to continue Norvasc as tolerated  - Lasix and losartan held on admission in setting of AKI    Hypothyroidism  - Appears stable, recent TSH wnl  - Continue Synthroid    Metabolic acidosis  - Mild, likely secondary to acute on chronic kidney disease  - Continue her bicarbonate therapy with 650 mg TID   Left renal mass  - Noted incidentally on chest CT at [facility] with [place], follow-up advised by radiology  - Renal  ultrasound showed no acute findings, no hydronephrosis, medical renal disease   Leukocytosis - Resolved, 10.2 on 1/9 - WBC 23,900 on admission, no fever and no apparent focus of infection  - Likely secondary to steroids    Hyponatremia  - Complicated by acute on chronic renal disease,  Continue NaHCO3 tabs TID   Anemia of chronic disease - Hemoglobin last 8.9, per patient's request, will give 1 dose of Procrit prior to discharge, she gets it monthly   Day of Discharge BP 114/60 (BP Location: Left Arm)   Pulse 72   Temp 98 F (36.7 C) (Oral)   Resp 18   Ht 5\' 7"  (1.702 m)   Wt 60.2 kg (132 lb 11.2 oz) Comment: scale b  SpO2 100%   BMI 20.78 kg/m   Physical Exam: General: Alert and awake oriented x3 not in any acute distress. HEENT: anicteric sclera, pupils reactive to light and accommodation CVS: S1-S2 clear no murmur rubs or gallops Chest: clear to auscultation bilaterally, no wheezing rales or rhonchi Abdomen: soft nontender, nondistended, normal bowel sounds Extremities: no cyanosis, clubbing or edema noted bilaterally Neuro: Cranial nerves II-XII intact, no focal neurological deficits   The results of significant diagnostics from this hospitalization (including imaging, microbiology, ancillary and laboratory) are listed below for reference.    LAB RESULTS: Basic Metabolic Panel:  Recent Labs Lab 12/04/16 2248  12/08/16 0441 12/09/16 0726  NA 129*  < > 133* 133*  133*  K 4.3  < > 3.9 3.7  3.7  CL 97*  < > 98* 99*  99*  CO2 19*  < > 22 23  24   GLUCOSE 122*  < > 156* 142*  142*  BUN 71*  < > 113* 117*  117*  CREATININE 3.85*  < > 3.27* 2.94*  2.98*  CALCIUM 9.0  < > 9.0 9.0  9.1  MG 2.0  --   --   --   < > = values in this interval not displayed. Liver Function Tests:  Recent Labs Lab 12/04/16 2248  AST 15  ALT 10*  ALKPHOS 83  BILITOT 0.4  PROT 6.4*  ALBUMIN 2.7*   No results for input(s): LIPASE, AMYLASE in the last 168 hours. No  results for input(s): AMMONIA in the last 168 hours. CBC:  Recent Labs Lab 12/04/16 2248  12/07/16 0315 12/08/16 0441  WBC 29.2*  < > 11.8* 10.2  NEUTROABS 28.0*  --   --   --   HGB 9.0*  < > 8.8* 8.9*  HCT 26.6*  < > 26.1* 27.0*  MCV 82.6  < > 83.1 83.6  PLT 282  < > 224 252  < > =  values in this interval not displayed. Cardiac Enzymes: No results for input(s): CKTOTAL, CKMB, CKMBINDEX, TROPONINI in the last 168 hours. BNP: Invalid input(s): POCBNP CBG:  Recent Labs Lab 12/09/16 0631 12/09/16 1122  GLUCAP 142* 144*    Significant Diagnostic Studies:  US Renal  Result Date: 12-28-2016 CLINICAL DATA:  Acute kidney injury.  Left renal mass. EXAM: RENAL / URINARY TRACT ULTRASOUND COMPLETE COMPARISON:  None. FINDINGS: Right Kidney: Length: 11.8 cm. Mild increased parenchymal echogenicity with renal cortical thinning. Cortical cyst arises from the upper pole measuring 4.6 x 2.3 x 4.5 cm. Another cortical cyst arises from the lower pole measuring 2.9 x 1.9 x 3.2 cm. No other renal masses. No stones. No hydronephrosis. Left Kidney: Length: 8.5 cm. Mild increased parenchymal echogenicity with renal cortical thinning. 2 contiguous upper pole cysts, 1 measuring 2.8 x 3.0 x 2.9 cm and the other measuring 2.3 x 2.9 x 2.7 cm. Smaller lower pole cyst measuring 1.4 x 1.4 x 1.5 cm. No other renal masses, no stones and no hydronephrosis. Bladder: Appears normal for degree of bladder distention. IMPRESSION: 1. No acute findings.  No hydronephrosis. 2. Increased renal parenchymal echogenicity with renal cortical thinning on the left, consistent with medical renal disease. 3. Bilateral renal cysts. Electronically Signed   By: Amie Portland M.D.   On: 2016/12/28 08:58   Dg Chest Port 1 View  Result Date: 12/04/2016 CLINICAL DATA:  Dyspnea EXAM: PORTABLE CHEST 1 VIEW COMPARISON:  12/03/2016 FINDINGS: The lungs are hyperinflated. Calcified lung nodules are again visualized consistent with granuloma.  Minimal atelectasis left base. No interval consolidation. No pneumothorax. Stable cardiomegaly with atherosclerosis.  Moderate hiatal hernia IMPRESSION: 1. Stable cardiomegaly without overt failure 2. Hiatal hernia 3. No acute infiltrate Electronically Signed   By: Jasmine Pang M.D.   On: 12/04/2016 23:37    2D ECHO: Study Conclusions  - Left ventricle: The cavity size was normal. Wall thickness was   normal. Systolic function was normal. The estimated ejection   fraction was in the range of 55% to 60%. Doppler parameters are   consistent with abnormal left ventricular relaxation (grade 1   diastolic dysfunction). - Aortic valve: There was trivial regurgitation. - Left atrium: The atrium was mildly dilated. - Pericardium, extracardiac: Moderate pericardial effusion   surrounds heart. It does not appear to be hemodynamiically   destabilizing by echo critieria. Clinical correlation indicated .   There was a left pleural effusion.  Disposition and Follow-up: Discharge Instructions    Diet - low sodium heart healthy    Complete by:  As directed    Increase activity slowly    Complete by:  As directed        DISPOSITION: home    DISCHARGE FOLLOW-UP Follow-up Information    Chilton Si, MD Follow up.   Specialty:  Cardiology Why:  our office will call you with an appointment for a repeat ulrasound of your heart as well as an appointment for hospital follow-up with cardiology in 2 weeks.  Contact information: [address] [place] [address] [phone]        Sadie Haber, MD. Go on 12/14/2016.   Specialty:  Nephrology Why:  @10 :15am Contact information: [address] [place] [address] [phone]        Maye Hides, MD Follow up on 12/22/2016.   Specialty:  Rheumatology Why:  @11 :Azzie Roup information: [address] [place] [address] [phone]  Time spent on Discharge: 35 mins    Signed:   RAI,RIPUDEEP M.D. [facility] 12/09/2016, 12:47 PM Pager: [phone]

## 2016-12-09 NOTE — Progress Notes (Signed)
Carollee Herter with [facility] called for rolling walker to be delivered to the room today prior to discharge home; Alexis Goodell [phone]

## 2016-12-09 NOTE — Telephone Encounter (Signed)
Lleft message for patient to call and scheduled Echo in 2 weeks and f/u appt with Dr. Duke Salvia.

## 2016-12-11 ENCOUNTER — Telehealth: Payer: Self-pay | Admitting: Cardiovascular Disease

## 2016-12-11 NOTE — Telephone Encounter (Signed)
PHONE BUSY.

## 2016-12-11 NOTE — Telephone Encounter (Signed)
Spoke to patient  Recommend  Stool softener,  Warm prunes , fiber enrich foods.  patient states she bought  Stool softener and mirlax - pharmacist informed patient to try stool softener first. RN AGREED. PATIENT VERBALIZED UNDERSTANDING.

## 2016-12-11 NOTE — Telephone Encounter (Signed)
Pt had a round on diarrhea of Saturday. Since that time she have not had a bowel movement,very concerned, Tomorrow will be a week,please call to advise. Pt apologize for calling,but she did not want to call her primary doctor.

## 2016-12-16 DIAGNOSIS — I313 Pericardial effusion (noninflammatory): Secondary | ICD-10-CM | POA: Diagnosis not present

## 2016-12-16 DIAGNOSIS — R269 Unspecified abnormalities of gait and mobility: Secondary | ICD-10-CM | POA: Diagnosis not present

## 2016-12-18 ENCOUNTER — Telehealth: Payer: Self-pay | Admitting: Cardiovascular Disease

## 2016-12-18 NOTE — Telephone Encounter (Signed)
°  New Prob   Pt c/o swelling: STAT is pt has developed SOB within 24 hours  1. How long have you been experiencing swelling? Started yesterday  2. Where is the swelling located? Feet and ankles bilaterally   3.  Are you currently taking a "fluid pill"? No  4.  Are you currently SOB? No  5.  Have you traveled recently? No

## 2016-12-18 NOTE — Telephone Encounter (Signed)
Returned call to patient.  Patient states her swelling improves overnight.  Patient states if she keeps her legs elevated, her legs are fine She states her swelling is not preventing her from her normal activities - she has cleaned her bathroom and vacuumed She states she was concerned as her issues tend to present on weekends or holidays when she does not have access to MDs - informed her we have on-call cardiology providers for emergencies.   She is aware to call our office for worsening edema, edema that does not go away with rest/leg elevated, if she develops shortness of breath. She voiced understanding.

## 2016-12-29 DIAGNOSIS — D631 Anemia in chronic kidney disease: Secondary | ICD-10-CM | POA: Diagnosis not present

## 2016-12-29 DIAGNOSIS — N189 Chronic kidney disease, unspecified: Secondary | ICD-10-CM | POA: Diagnosis not present

## 2016-12-30 ENCOUNTER — Other Ambulatory Visit: Payer: Self-pay

## 2016-12-30 ENCOUNTER — Ambulatory Visit (HOSPITAL_COMMUNITY): Payer: Medicare Other | Attending: Cardiovascular Disease

## 2016-12-30 ENCOUNTER — Other Ambulatory Visit: Payer: Self-pay | Admitting: Cardiology

## 2016-12-30 DIAGNOSIS — J439 Emphysema, unspecified: Secondary | ICD-10-CM | POA: Insufficient documentation

## 2016-12-30 DIAGNOSIS — I340 Nonrheumatic mitral (valve) insufficiency: Secondary | ICD-10-CM | POA: Insufficient documentation

## 2016-12-30 DIAGNOSIS — I313 Pericardial effusion (noninflammatory): Secondary | ICD-10-CM

## 2016-12-30 DIAGNOSIS — I3139 Other pericardial effusion (noninflammatory): Secondary | ICD-10-CM

## 2016-12-30 DIAGNOSIS — I129 Hypertensive chronic kidney disease with stage 1 through stage 4 chronic kidney disease, or unspecified chronic kidney disease: Secondary | ICD-10-CM | POA: Insufficient documentation

## 2016-12-30 DIAGNOSIS — N184 Chronic kidney disease, stage 4 (severe): Secondary | ICD-10-CM | POA: Insufficient documentation

## 2017-01-07 ENCOUNTER — Ambulatory Visit (INDEPENDENT_AMBULATORY_CARE_PROVIDER_SITE_OTHER): Payer: Medicare Other | Admitting: Cardiovascular Disease

## 2017-01-07 ENCOUNTER — Encounter: Payer: Self-pay | Admitting: Cardiovascular Disease

## 2017-01-07 VITALS — BP 152/82 | HR 98 | Ht 67.0 in | Wt 133.0 lb

## 2017-01-07 DIAGNOSIS — I313 Pericardial effusion (noninflammatory): Secondary | ICD-10-CM | POA: Diagnosis not present

## 2017-01-07 DIAGNOSIS — I119 Hypertensive heart disease without heart failure: Secondary | ICD-10-CM | POA: Diagnosis not present

## 2017-01-07 DIAGNOSIS — N184 Chronic kidney disease, stage 4 (severe): Secondary | ICD-10-CM | POA: Diagnosis not present

## 2017-01-07 DIAGNOSIS — I3139 Other pericardial effusion (noninflammatory): Secondary | ICD-10-CM

## 2017-01-07 MED ORDER — AMLODIPINE BESYLATE 10 MG PO TABS: 10.0000 mg | ORAL_TABLET | Freq: Every day | ORAL | 1 refills | Status: DC

## 2017-01-07 NOTE — Progress Notes (Signed)
Cardiology Office Note   Date:  01/07/2017   ID:  Marya, Lowden [birth year], MRN [id number]  PCP:  Maryjean Ka, MD  Cardiologist:   Chilton Si, MD  Nephrologist: Dr. Camille Bal  Chief Complaint  Patient presents with  . New Patient (Initial Visit)      History of Present Illness: Kaelani Kendrick is a 83 y.o. female with hypertension, emphysema, CKD IV and rheumatoid arthritis who presents for follow up.  She was seen in the hospital 12/08/16 in the setting of an upper respiratory infection and was noted to have a moderate pericardial effusion without evidence of tamponade.  Cardiology was consulted but she did not undergo pericardiocentesis as she was clinically stable.  Since being discharged from the hospital she has been feeling well. She denies chest pain or shortness of breath. She does have chronic lower extremity edema that has been worse since she was started on prednisone. The past she has tried wearing compression stockings but is unable to get them on due to her rheumatoid arthritis. Of note, that hospitalization was complicated by acute on chronic renal failure.  Her BUN was 117.  She has been a patient of Dr. Eliott Nine (nephrology).  She is unsure whether she would want hemodialysis necessary. She continues to have good urinary output.  Ms. Hegeman doesn't get much formal exercise but has been working around her home and in her yard. She had no exertional symptoms. Her main complaint is this time is difficulty sleeping. At times she can go up to 48 hours without sleeping. She was prescribed Ambien but is afraid to take it because it states use caution with renal dysfunction.    Past Medical History:  Diagnosis Date  . Anemia   . Arthritis   . Chronic kidney disease   . Gout   . Hypertension     Past Surgical History:  Procedure Laterality Date  . ABDOMINAL HYSTERECTOMY    . AV FISTULA PLACEMENT  06/27/2009   right upper arm BC vein AVF  . CATARACT  EXTRACTION, BILATERAL  2012  . JOINT REPLACEMENT  1995   right hip     Current Outpatient Prescriptions  Medication Sig Dispense Refill  . acetaminophen (TYLENOL) 500 MG tablet Take 1,000 mg by mouth See admin instructions. Take 2 tablets (1000 mg) by mouth daily at bedtime, may also take 2 tablets during the day as needed for arthritis pain    . allopurinol (ZYLOPRIM) 100 MG tablet Take 100 mg by mouth daily.     Marland Kitchen amLODipine (NORVASC) 10 MG tablet Take 1 tablet (10 mg total) by mouth daily. 90 tablet 1  . calcitRIOL (ROCALTROL) 0.25 MCG capsule Take 0.25 mcg by mouth daily. Monday and Friday    . cholecalciferol (VITAMIN D) 1000 UNITS tablet Take 1,000 Units by mouth daily with supper.     . cloNIDine (CATAPRES) 0.2 MG tablet Take 0.2 mg by mouth 2 (two) times daily with a meal.     . Epoetin Alfa (PROCRIT IJ) Inject as directed as needed.    . folic acid (FOLVITE) 1 MG tablet Take 1 mg by mouth daily.    Marland Kitchen levothyroxine (SYNTHROID, LEVOTHROID) 75 MCG tablet Take 75 mcg by mouth at bedtime.     . methylPREDNISolone (MEDROL) 4 MG tablet Take 1 tablet (4 mg total) by mouth daily. Continuous course for arthritis pain. HOLD WHILE YOU ARE ON HIGHER DOSE PREDNISONE AND TAPER IS COMPLETED.    Marland Kitchen ranitidine (  ZANTAC) 75 MG tablet Take 75 mg by mouth 2 (two) times daily.    . sodium bicarbonate 650 MG tablet Take 650 mg by mouth daily.     Marland Kitchen zolpidem (AMBIEN) 5 MG tablet Take by mouth as directed. Pt takes a half tablet     No current facility-administered medications for this visit.     Allergies:   Levaquin [levofloxacin in d5w] and Tape    Social History:  The patient  reports that she has never smoked. She has never used smokeless tobacco. She reports that she does not drink alcohol or use drugs.   Family History:  The patient's family history includes Cancer in her sister; Diabetes in her brother; Heart attack in her daughter and father; Heart disease in her mother; Other in her sister.     ROS:  Please see the history of present illness.   Otherwise, review of systems are positive for none.   All other systems are reviewed and negative.    PHYSICAL EXAM: VS:  BP (!) 152/82   Pulse 98   Ht 5\' 7"  (1.702 m)   Wt 60.3 kg (133 lb)   SpO2 99%   BMI 20.83 kg/m  , BMI Body mass index is 20.83 kg/m. GENERAL:  Well appearing HEENT:  Pupils equal round and reactive, fundi not visualized, oral mucosa unremarkable NECK:  No jugular venous distention, waveform within normal limits, carotid upstroke brisk and symmetric, no bruits, no thyromegaly LYMPHATICS:  No cervical adenopathy LUNGS:  Clear to auscultation bilaterally HEART:  RRR.  PMI not displaced or sustained,S1 and S2 within normal limits, no S3, no S4, no clicks, no rubs, no murmurs ABD:  Flat, positive bowel sounds normal in frequency in pitch, no bruits, no rebound, no guarding, no midline pulsatile mass, no hepatomegaly, no splenomegaly EXT:  2 plus pulses throughout, no edema, no cyanosis no clubbing 2+ pitting edema to lower tibia  SKIN:  No rashes no nodules NEURO:  Cranial nerves II through XII grossly intact, motor grossly intact throughout PSYCH:  Cognitively intact, oriented to person place and time    EKG:  EKG is not ordered today.   Echo 1.31.18: Study Conclusions  - Left ventricle: Systolic function was normal. The estimated   ejection fraction was in the range of 55% to 60%. Doppler   parameters are consistent with abnormal left ventricular   relaxation (grade 1 diastolic dysfunction). - Aortic valve: There was trivial regurgitation. - Mitral valve: There was mild regurgitation. - Atrial septum: No defect or patent foramen ovale was identified. - Pulmonary arteries: PA peak pressure: 38 mm Hg (S). - Pericardium, extracardiac: Moderate pericardial effusion IVC flat   no signs of tamponade however the effusion appears larger   anteriorly over RV and laterally compared   to echo done  12/07/16.  Recent Labs: 12/04/2016: ALT 10; B Natriuretic Peptide 299.9; Magnesium 2.0; TSH 0.815 12/08/2016: Hemoglobin 8.9; Platelets 252 12/09/2016: BUN 117; BUN 117; Creatinine, Ser 2.94; Creatinine, Ser 2.98; Potassium 3.7; Potassium 3.7; Sodium 133; Sodium 133    Lipid Panel No results found for: CHOL, TRIG, HDL, CHOLHDL, VLDL, LDLCALC, LDLDIRECT    Wt Readings from Last 3 Encounters:  01/07/17 60.3 kg (133 lb)  12/09/16 60.2 kg (132 lb 11.2 oz)  11/08/12 68 kg (150 lb)      ASSESSMENT AND PLAN:  # Pericardial effusion:  Ms. Ballow has a moderate air cardial effusion that is relatively unchanged after 2 weeks. I suspect that this is  due to uremia given her BUN of 117.  I offered to check it again today, however she prefers to wait until she has labs with Dr. Eliott Nine next week. Her echo did not show any evidence of tamponade not read therefore, there is no hemodynamic compromise at this time and it does not have to be drained. We discussed the fact that it would be helpful from a diagnostic standpoint. She understands but would rather not have it drained unless she absolutely has to. We did discuss whether or not she would be willing to pursue hemodialysis is indicated. She has to think about it some more but thinks that she would hemodialysis if necessary.  She will discuss this further with Dr. Eliott Nine. [facility] plan to repeat her echocardiogram in 1 month. She understands that she should call us if she develops worsening lower extremity edema, orthopnea, shortness of breath, palpitations, chest pain, or dizziness.  # CKD IV: Follow up with Dr. Eliott Nine as above.  # Hypertension:  Blood pressure above goal.  Increase amlodipine to 10 mg.  continue clonidine. If her blood pressure remains poorly-controlled, would consider adding carvedilol. She is not on an ACE inhibitor or ARB due to renal dysfunction   Current medicines are reviewed at length with the patient today.  The patient does not have  concerns regarding medicines.  The following changes have been made:  Increase amlodipine to 10 mg daily  Labs/ tests ordered today include:   Orders Placed This Encounter  Procedures  . ECHOCARDIOGRAM COMPLETE     Disposition:   FU with Alonie Gazzola C. Duke Salvia, MD, [facility] in 3 months    This note was written with the assistance of speech recognition software.  Please excuse any transcriptional errors.  Signed, Tiphani Mells C. Duke Salvia, MD, [facility]  01/07/2017 1:10 PM    [facility]

## 2017-01-07 NOTE — Patient Instructions (Signed)
Medication Instructions:  INCREASE YOUR AMLODIPINE TO 10 MG DAILY  Labwork: NONE  Testing/Procedures: Your physician has requested that you have an echocardiogram. LIMITED Echocardiography is a painless test that uses sound waves to create images of your heart. It provides your doctor with information about the size and shape of your heart and how well your heart's chambers and valves are working. This procedure takes approximately one hour. There are no restrictions for this procedure. [facility] AT [address] IN 1 MONTH   Follow-Up: Your physician recommends that you schedule a follow-up appointment in:3 MONTH OV  If you need a refill on your cardiac medications before your next appointment, please call your pharmacy.

## 2017-01-14 DIAGNOSIS — D631 Anemia in chronic kidney disease: Secondary | ICD-10-CM | POA: Diagnosis not present

## 2017-01-14 DIAGNOSIS — N184 Chronic kidney disease, stage 4 (severe): Secondary | ICD-10-CM | POA: Diagnosis not present

## 2017-01-14 DIAGNOSIS — N2581 Secondary hyperparathyroidism of renal origin: Secondary | ICD-10-CM | POA: Diagnosis not present

## 2017-01-14 DIAGNOSIS — M109 Gout, unspecified: Secondary | ICD-10-CM | POA: Diagnosis not present

## 2017-01-14 DIAGNOSIS — I129 Hypertensive chronic kidney disease with stage 1 through stage 4 chronic kidney disease, or unspecified chronic kidney disease: Secondary | ICD-10-CM | POA: Diagnosis not present

## 2017-01-20 DIAGNOSIS — M19042 Primary osteoarthritis, left hand: Secondary | ICD-10-CM | POA: Diagnosis not present

## 2017-01-20 DIAGNOSIS — M79643 Pain in unspecified hand: Secondary | ICD-10-CM | POA: Diagnosis not present

## 2017-01-20 DIAGNOSIS — M0589 Other rheumatoid arthritis with rheumatoid factor of multiple sites: Secondary | ICD-10-CM | POA: Diagnosis not present

## 2017-01-20 DIAGNOSIS — M19041 Primary osteoarthritis, right hand: Secondary | ICD-10-CM | POA: Diagnosis not present

## 2017-01-27 DIAGNOSIS — N189 Chronic kidney disease, unspecified: Secondary | ICD-10-CM | POA: Diagnosis not present

## 2017-01-27 DIAGNOSIS — D631 Anemia in chronic kidney disease: Secondary | ICD-10-CM | POA: Diagnosis not present

## 2017-02-04 DIAGNOSIS — D510 Vitamin B12 deficiency anemia due to intrinsic factor deficiency: Secondary | ICD-10-CM | POA: Diagnosis not present

## 2017-02-09 DIAGNOSIS — I313 Pericardial effusion (noninflammatory): Secondary | ICD-10-CM | POA: Diagnosis not present

## 2017-02-09 DIAGNOSIS — N184 Chronic kidney disease, stage 4 (severe): Secondary | ICD-10-CM | POA: Diagnosis not present

## 2017-02-09 DIAGNOSIS — I10 Essential (primary) hypertension: Secondary | ICD-10-CM | POA: Diagnosis not present

## 2017-02-11 ENCOUNTER — Other Ambulatory Visit (HOSPITAL_COMMUNITY): Payer: Medicare Other

## 2017-02-18 DIAGNOSIS — R0602 Shortness of breath: Secondary | ICD-10-CM | POA: Diagnosis not present

## 2017-02-24 DIAGNOSIS — I313 Pericardial effusion (noninflammatory): Secondary | ICD-10-CM | POA: Diagnosis not present

## 2017-03-05 DIAGNOSIS — D631 Anemia in chronic kidney disease: Secondary | ICD-10-CM | POA: Diagnosis not present

## 2017-03-05 DIAGNOSIS — N189 Chronic kidney disease, unspecified: Secondary | ICD-10-CM | POA: Diagnosis not present

## 2017-03-11 DIAGNOSIS — I10 Essential (primary) hypertension: Secondary | ICD-10-CM | POA: Diagnosis not present

## 2017-03-11 DIAGNOSIS — I313 Pericardial effusion (noninflammatory): Secondary | ICD-10-CM | POA: Diagnosis not present

## 2017-03-11 DIAGNOSIS — N184 Chronic kidney disease, stage 4 (severe): Secondary | ICD-10-CM | POA: Diagnosis not present

## 2017-03-12 ENCOUNTER — Telehealth: Payer: Self-pay | Admitting: Cardiovascular Disease

## 2017-03-12 NOTE — Telephone Encounter (Signed)
New Message  Pt call stating she did not need to schedule echo. Pt states she has changed providers. Please call back if needed.

## 2017-03-12 NOTE — Telephone Encounter (Signed)
Returned the call to the patient to verify that she has changed providers and would not need the ECHO. She verbalized that this was correct.

## 2017-03-15 DIAGNOSIS — Z1231 Encounter for screening mammogram for malignant neoplasm of breast: Secondary | ICD-10-CM | POA: Diagnosis not present

## 2017-04-02 DIAGNOSIS — D631 Anemia in chronic kidney disease: Secondary | ICD-10-CM | POA: Diagnosis not present

## 2017-04-02 DIAGNOSIS — N189 Chronic kidney disease, unspecified: Secondary | ICD-10-CM | POA: Diagnosis not present

## 2017-04-05 DIAGNOSIS — D631 Anemia in chronic kidney disease: Secondary | ICD-10-CM | POA: Diagnosis not present

## 2017-04-05 DIAGNOSIS — N189 Chronic kidney disease, unspecified: Secondary | ICD-10-CM | POA: Diagnosis not present

## 2017-04-06 DIAGNOSIS — M109 Gout, unspecified: Secondary | ICD-10-CM | POA: Diagnosis not present

## 2017-04-06 DIAGNOSIS — N2581 Secondary hyperparathyroidism of renal origin: Secondary | ICD-10-CM | POA: Diagnosis not present

## 2017-04-06 DIAGNOSIS — I129 Hypertensive chronic kidney disease with stage 1 through stage 4 chronic kidney disease, or unspecified chronic kidney disease: Secondary | ICD-10-CM | POA: Diagnosis not present

## 2017-04-06 DIAGNOSIS — N184 Chronic kidney disease, stage 4 (severe): Secondary | ICD-10-CM | POA: Diagnosis not present

## 2017-04-06 DIAGNOSIS — D631 Anemia in chronic kidney disease: Secondary | ICD-10-CM | POA: Diagnosis not present

## 2017-04-07 DIAGNOSIS — I10 Essential (primary) hypertension: Secondary | ICD-10-CM | POA: Diagnosis not present

## 2017-04-07 DIAGNOSIS — I313 Pericardial effusion (noninflammatory): Secondary | ICD-10-CM | POA: Diagnosis not present

## 2017-04-08 ENCOUNTER — Ambulatory Visit: Payer: Medicare Other | Admitting: Cardiovascular Disease

## 2017-04-20 DIAGNOSIS — M1A09X0 Idiopathic chronic gout, multiple sites, without tophus (tophi): Secondary | ICD-10-CM | POA: Diagnosis not present

## 2017-04-20 DIAGNOSIS — M0589 Other rheumatoid arthritis with rheumatoid factor of multiple sites: Secondary | ICD-10-CM | POA: Diagnosis not present

## 2017-04-20 DIAGNOSIS — M810 Age-related osteoporosis without current pathological fracture: Secondary | ICD-10-CM | POA: Diagnosis not present

## 2017-04-20 DIAGNOSIS — M150 Primary generalized (osteo)arthritis: Secondary | ICD-10-CM | POA: Diagnosis not present

## 2017-05-07 DIAGNOSIS — D631 Anemia in chronic kidney disease: Secondary | ICD-10-CM | POA: Diagnosis not present

## 2017-05-07 DIAGNOSIS — N189 Chronic kidney disease, unspecified: Secondary | ICD-10-CM | POA: Diagnosis not present

## 2017-05-13 DIAGNOSIS — M159 Polyosteoarthritis, unspecified: Secondary | ICD-10-CM | POA: Diagnosis not present

## 2017-05-13 DIAGNOSIS — M6281 Muscle weakness (generalized): Secondary | ICD-10-CM | POA: Diagnosis not present

## 2017-05-13 DIAGNOSIS — M25512 Pain in left shoulder: Secondary | ICD-10-CM | POA: Diagnosis not present

## 2017-05-13 DIAGNOSIS — M67912 Unspecified disorder of synovium and tendon, left shoulder: Secondary | ICD-10-CM | POA: Diagnosis not present

## 2017-05-13 DIAGNOSIS — M069 Rheumatoid arthritis, unspecified: Secondary | ICD-10-CM | POA: Diagnosis not present

## 2017-05-25 DIAGNOSIS — M6281 Muscle weakness (generalized): Secondary | ICD-10-CM | POA: Diagnosis not present

## 2017-05-25 DIAGNOSIS — M67912 Unspecified disorder of synovium and tendon, left shoulder: Secondary | ICD-10-CM | POA: Diagnosis not present

## 2017-05-25 DIAGNOSIS — M25512 Pain in left shoulder: Secondary | ICD-10-CM | POA: Diagnosis not present

## 2017-05-31 DIAGNOSIS — M25512 Pain in left shoulder: Secondary | ICD-10-CM | POA: Diagnosis not present

## 2017-05-31 DIAGNOSIS — M6281 Muscle weakness (generalized): Secondary | ICD-10-CM | POA: Diagnosis not present

## 2017-05-31 DIAGNOSIS — M67912 Unspecified disorder of synovium and tendon, left shoulder: Secondary | ICD-10-CM | POA: Diagnosis not present

## 2017-06-04 DIAGNOSIS — N189 Chronic kidney disease, unspecified: Secondary | ICD-10-CM | POA: Diagnosis not present

## 2017-06-04 DIAGNOSIS — D631 Anemia in chronic kidney disease: Secondary | ICD-10-CM | POA: Diagnosis not present

## 2017-06-16 IMAGING — CR DG CHEST 1V PORT
1 series · 1 of 1 positions shown · non-contrast
Comparison: 12/03/2016

CLINICAL DATA: Dyspnea

EXAM:
PORTABLE CHEST 1 VIEW

[AP]
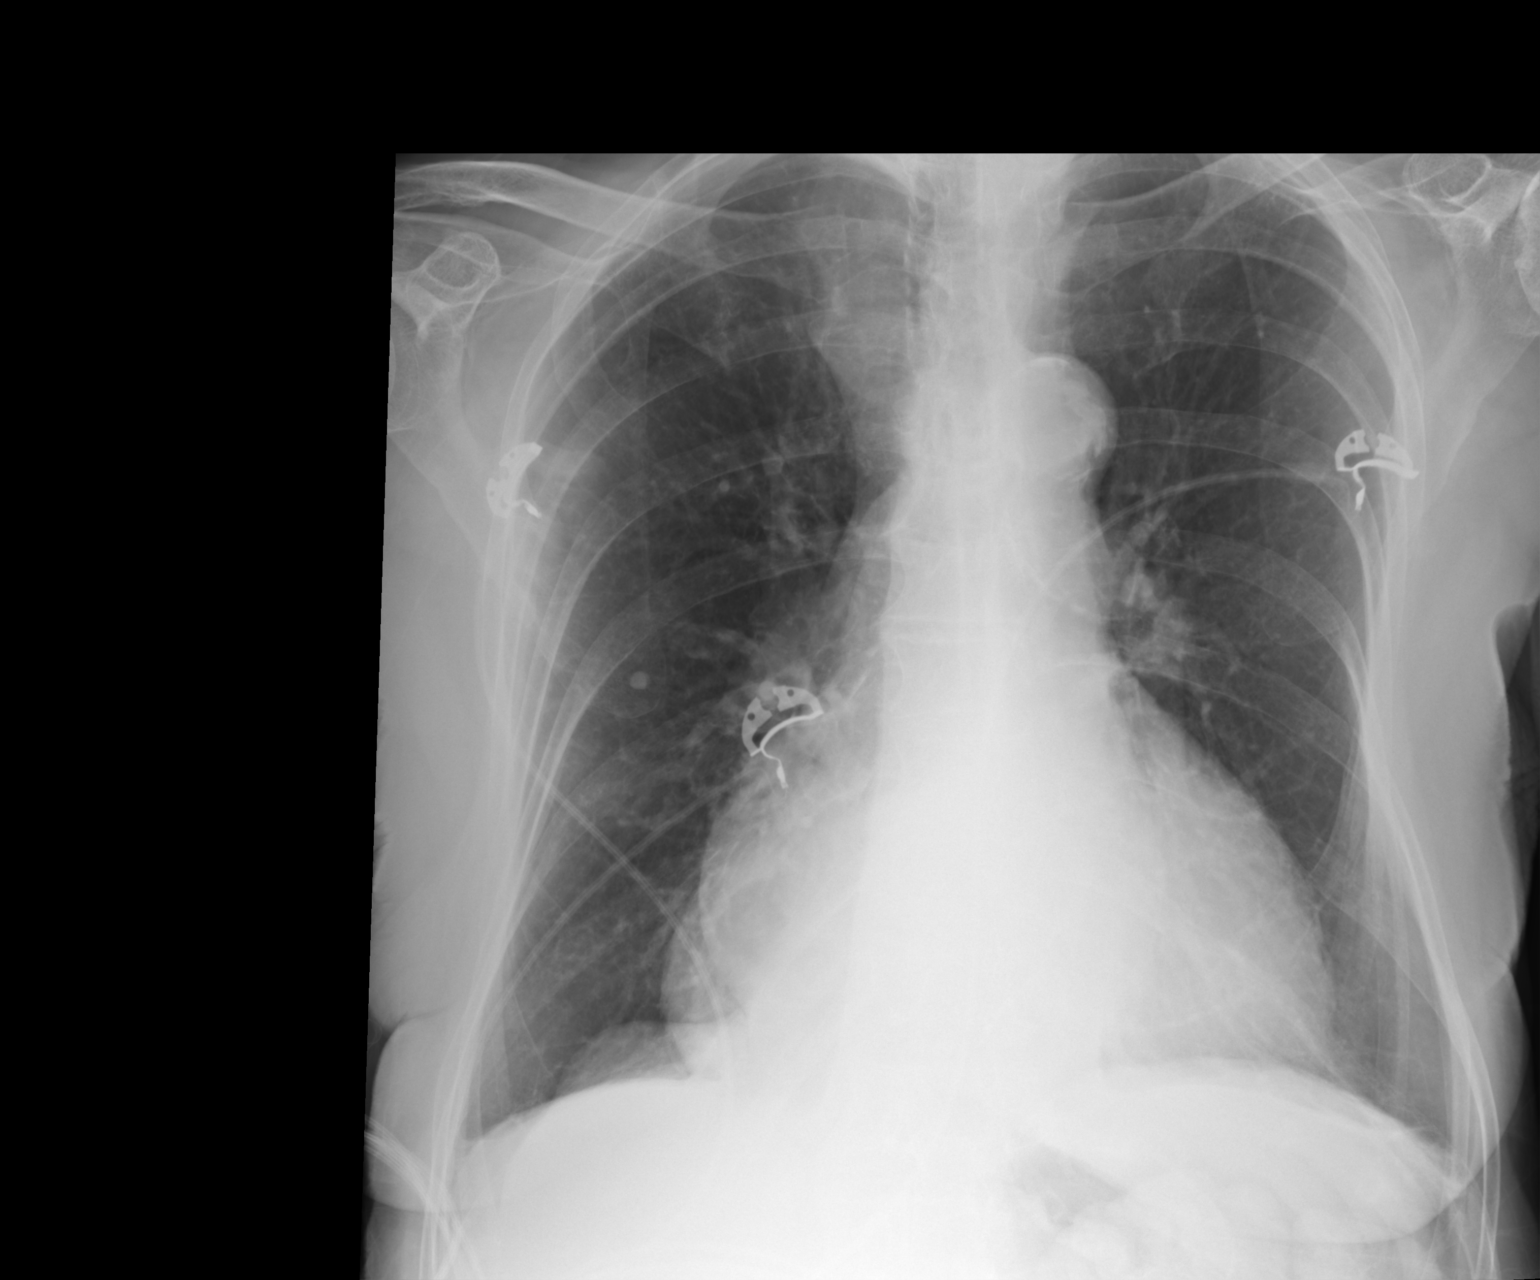

[1 of 1 positions shown; findings below may reference images not displayed]

FINDINGS: The lungs are hyperinflated. Calcified lung nodules are again
visualized consistent with granuloma. Minimal atelectasis left base.
No interval consolidation. No pneumothorax.

Stable cardiomegaly with atherosclerosis.  Moderate hiatal hernia
IMPRESSION: 1. Stable cardiomegaly without overt failure
2. Hiatal hernia
3. No acute infiltrate

## 2017-07-05 DIAGNOSIS — D631 Anemia in chronic kidney disease: Secondary | ICD-10-CM | POA: Diagnosis not present

## 2017-07-05 DIAGNOSIS — N189 Chronic kidney disease, unspecified: Secondary | ICD-10-CM | POA: Diagnosis not present

## 2017-07-06 DIAGNOSIS — S92009A Unspecified fracture of unspecified calcaneus, initial encounter for closed fracture: Secondary | ICD-10-CM | POA: Diagnosis not present

## 2017-07-13 DIAGNOSIS — M79671 Pain in right foot: Secondary | ICD-10-CM | POA: Diagnosis not present

## 2017-07-13 DIAGNOSIS — M7671 Peroneal tendinitis, right leg: Secondary | ICD-10-CM | POA: Diagnosis not present

## 2017-07-20 DIAGNOSIS — M79671 Pain in right foot: Secondary | ICD-10-CM | POA: Diagnosis not present

## 2017-07-20 DIAGNOSIS — M7671 Peroneal tendinitis, right leg: Secondary | ICD-10-CM | POA: Diagnosis not present

## 2017-07-26 DIAGNOSIS — N2581 Secondary hyperparathyroidism of renal origin: Secondary | ICD-10-CM | POA: Diagnosis not present

## 2017-07-26 DIAGNOSIS — D631 Anemia in chronic kidney disease: Secondary | ICD-10-CM | POA: Diagnosis not present

## 2017-07-26 DIAGNOSIS — N184 Chronic kidney disease, stage 4 (severe): Secondary | ICD-10-CM | POA: Diagnosis not present

## 2017-07-26 DIAGNOSIS — R8271 Bacteriuria: Secondary | ICD-10-CM | POA: Diagnosis not present

## 2017-07-26 DIAGNOSIS — M109 Gout, unspecified: Secondary | ICD-10-CM | POA: Diagnosis not present

## 2017-07-26 DIAGNOSIS — I129 Hypertensive chronic kidney disease with stage 1 through stage 4 chronic kidney disease, or unspecified chronic kidney disease: Secondary | ICD-10-CM | POA: Diagnosis not present

## 2017-07-27 DIAGNOSIS — M0589 Other rheumatoid arthritis with rheumatoid factor of multiple sites: Secondary | ICD-10-CM | POA: Diagnosis not present

## 2017-07-27 DIAGNOSIS — M1180 Other specified crystal arthropathies, unspecified site: Secondary | ICD-10-CM | POA: Diagnosis not present

## 2017-07-27 DIAGNOSIS — Z7952 Long term (current) use of systemic steroids: Secondary | ICD-10-CM | POA: Diagnosis not present

## 2017-07-27 DIAGNOSIS — M1A09X0 Idiopathic chronic gout, multiple sites, without tophus (tophi): Secondary | ICD-10-CM | POA: Diagnosis not present

## 2017-08-03 DIAGNOSIS — D631 Anemia in chronic kidney disease: Secondary | ICD-10-CM | POA: Diagnosis not present

## 2017-08-03 DIAGNOSIS — N189 Chronic kidney disease, unspecified: Secondary | ICD-10-CM | POA: Diagnosis not present

## 2017-08-30 DIAGNOSIS — D631 Anemia in chronic kidney disease: Secondary | ICD-10-CM | POA: Diagnosis not present

## 2017-08-30 DIAGNOSIS — N189 Chronic kidney disease, unspecified: Secondary | ICD-10-CM | POA: Diagnosis not present

## 2017-09-09 DIAGNOSIS — Z6820 Body mass index (BMI) 20.0-20.9, adult: Secondary | ICD-10-CM | POA: Diagnosis not present

## 2017-09-09 DIAGNOSIS — Z23 Encounter for immunization: Secondary | ICD-10-CM | POA: Diagnosis not present

## 2017-09-09 DIAGNOSIS — E039 Hypothyroidism, unspecified: Secondary | ICD-10-CM | POA: Diagnosis not present

## 2017-09-09 DIAGNOSIS — Z79899 Other long term (current) drug therapy: Secondary | ICD-10-CM | POA: Diagnosis not present

## 2017-09-20 DIAGNOSIS — H1013 Acute atopic conjunctivitis, bilateral: Secondary | ICD-10-CM | POA: Diagnosis not present

## 2017-09-27 DIAGNOSIS — D631 Anemia in chronic kidney disease: Secondary | ICD-10-CM | POA: Diagnosis not present

## 2017-09-27 DIAGNOSIS — N189 Chronic kidney disease, unspecified: Secondary | ICD-10-CM | POA: Diagnosis not present

## 2017-10-08 DIAGNOSIS — J329 Chronic sinusitis, unspecified: Secondary | ICD-10-CM | POA: Diagnosis not present

## 2017-10-08 DIAGNOSIS — Z6820 Body mass index (BMI) 20.0-20.9, adult: Secondary | ICD-10-CM | POA: Diagnosis not present

## 2017-10-28 DIAGNOSIS — S0990XA Unspecified injury of head, initial encounter: Secondary | ICD-10-CM | POA: Diagnosis not present

## 2017-10-28 DIAGNOSIS — S72002A Fracture of unspecified part of neck of left femur, initial encounter for closed fracture: Secondary | ICD-10-CM | POA: Diagnosis not present

## 2017-10-28 DIAGNOSIS — Z79899 Other long term (current) drug therapy: Secondary | ICD-10-CM | POA: Diagnosis not present

## 2017-10-28 DIAGNOSIS — N179 Acute kidney failure, unspecified: Secondary | ICD-10-CM | POA: Diagnosis not present

## 2017-10-28 DIAGNOSIS — M069 Rheumatoid arthritis, unspecified: Secondary | ICD-10-CM | POA: Diagnosis not present

## 2017-10-28 DIAGNOSIS — I129 Hypertensive chronic kidney disease with stage 1 through stage 4 chronic kidney disease, or unspecified chronic kidney disease: Secondary | ICD-10-CM | POA: Diagnosis not present

## 2017-10-28 DIAGNOSIS — Z96642 Presence of left artificial hip joint: Secondary | ICD-10-CM | POA: Diagnosis not present

## 2017-10-28 DIAGNOSIS — J918 Pleural effusion in other conditions classified elsewhere: Secondary | ICD-10-CM | POA: Diagnosis not present

## 2017-10-28 DIAGNOSIS — D72828 Other elevated white blood cell count: Secondary | ICD-10-CM | POA: Diagnosis not present

## 2017-10-28 DIAGNOSIS — Z7952 Long term (current) use of systemic steroids: Secondary | ICD-10-CM | POA: Diagnosis not present

## 2017-10-28 DIAGNOSIS — M1612 Unilateral primary osteoarthritis, left hip: Secondary | ICD-10-CM | POA: Diagnosis not present

## 2017-10-28 DIAGNOSIS — I313 Pericardial effusion (noninflammatory): Secondary | ICD-10-CM | POA: Diagnosis not present

## 2017-10-28 DIAGNOSIS — M25552 Pain in left hip: Secondary | ICD-10-CM | POA: Diagnosis not present

## 2017-10-28 DIAGNOSIS — N184 Chronic kidney disease, stage 4 (severe): Secondary | ICD-10-CM | POA: Diagnosis not present

## 2017-10-28 DIAGNOSIS — E039 Hypothyroidism, unspecified: Secondary | ICD-10-CM | POA: Diagnosis not present

## 2017-10-28 DIAGNOSIS — S72012A Unspecified intracapsular fracture of left femur, initial encounter for closed fracture: Secondary | ICD-10-CM | POA: Diagnosis not present

## 2017-10-28 DIAGNOSIS — S72009A Fracture of unspecified part of neck of unspecified femur, initial encounter for closed fracture: Secondary | ICD-10-CM | POA: Diagnosis not present

## 2017-10-28 DIAGNOSIS — M84652A Pathological fracture in other disease, left femur, initial encounter for fracture: Secondary | ICD-10-CM | POA: Diagnosis not present

## 2017-10-28 DIAGNOSIS — I10 Essential (primary) hypertension: Secondary | ICD-10-CM | POA: Diagnosis not present

## 2017-10-28 DIAGNOSIS — J449 Chronic obstructive pulmonary disease, unspecified: Secondary | ICD-10-CM | POA: Diagnosis not present

## 2017-10-28 DIAGNOSIS — S72002D Fracture of unspecified part of neck of left femur, subsequent encounter for closed fracture with routine healing: Secondary | ICD-10-CM | POA: Diagnosis not present

## 2017-10-28 DIAGNOSIS — Z01818 Encounter for other preprocedural examination: Secondary | ICD-10-CM | POA: Diagnosis not present

## 2017-10-28 DIAGNOSIS — I517 Cardiomegaly: Secondary | ICD-10-CM | POA: Diagnosis not present

## 2017-10-28 DIAGNOSIS — D638 Anemia in other chronic diseases classified elsewhere: Secondary | ICD-10-CM | POA: Diagnosis not present

## 2017-10-28 DIAGNOSIS — D72829 Elevated white blood cell count, unspecified: Secondary | ICD-10-CM | POA: Diagnosis not present

## 2017-10-28 DIAGNOSIS — N189 Chronic kidney disease, unspecified: Secondary | ICD-10-CM | POA: Diagnosis not present

## 2017-10-28 DIAGNOSIS — M79605 Pain in left leg: Secondary | ICD-10-CM | POA: Diagnosis not present

## 2017-10-28 DIAGNOSIS — T148XXA Other injury of unspecified body region, initial encounter: Secondary | ICD-10-CM | POA: Diagnosis not present

## 2017-10-28 DIAGNOSIS — S79911A Unspecified injury of right hip, initial encounter: Secondary | ICD-10-CM | POA: Diagnosis not present

## 2017-10-28 DIAGNOSIS — R51 Headache: Secondary | ICD-10-CM | POA: Diagnosis not present

## 2017-10-28 DIAGNOSIS — R531 Weakness: Secondary | ICD-10-CM | POA: Diagnosis not present

## 2017-10-29 DIAGNOSIS — E039 Hypothyroidism, unspecified: Secondary | ICD-10-CM | POA: Diagnosis not present

## 2017-10-29 DIAGNOSIS — M069 Rheumatoid arthritis, unspecified: Secondary | ICD-10-CM | POA: Diagnosis not present

## 2017-10-29 DIAGNOSIS — D72829 Elevated white blood cell count, unspecified: Secondary | ICD-10-CM | POA: Diagnosis not present

## 2017-10-29 DIAGNOSIS — S72002A Fracture of unspecified part of neck of left femur, initial encounter for closed fracture: Secondary | ICD-10-CM | POA: Diagnosis not present

## 2017-10-29 DIAGNOSIS — I10 Essential (primary) hypertension: Secondary | ICD-10-CM | POA: Diagnosis not present

## 2017-10-29 DIAGNOSIS — J918 Pleural effusion in other conditions classified elsewhere: Secondary | ICD-10-CM | POA: Diagnosis not present

## 2017-10-29 DIAGNOSIS — J449 Chronic obstructive pulmonary disease, unspecified: Secondary | ICD-10-CM | POA: Diagnosis not present

## 2017-10-29 DIAGNOSIS — R531 Weakness: Secondary | ICD-10-CM | POA: Diagnosis not present

## 2017-10-29 DIAGNOSIS — I313 Pericardial effusion (noninflammatory): Secondary | ICD-10-CM | POA: Diagnosis not present

## 2017-10-29 DIAGNOSIS — N189 Chronic kidney disease, unspecified: Secondary | ICD-10-CM | POA: Diagnosis not present

## 2017-10-30 HISTORY — PX: PARTIAL HIP ARTHROPLASTY: SHX733

## 2017-10-31 DIAGNOSIS — Z01818 Encounter for other preprocedural examination: Secondary | ICD-10-CM | POA: Diagnosis not present

## 2017-10-31 DIAGNOSIS — I10 Essential (primary) hypertension: Secondary | ICD-10-CM | POA: Diagnosis not present

## 2017-10-31 DIAGNOSIS — E039 Hypothyroidism, unspecified: Secondary | ICD-10-CM | POA: Diagnosis not present

## 2017-10-31 DIAGNOSIS — S72009A Fracture of unspecified part of neck of unspecified femur, initial encounter for closed fracture: Secondary | ICD-10-CM | POA: Diagnosis not present

## 2017-10-31 DIAGNOSIS — S72002A Fracture of unspecified part of neck of left femur, initial encounter for closed fracture: Secondary | ICD-10-CM | POA: Diagnosis not present

## 2017-10-31 DIAGNOSIS — S72002D Fracture of unspecified part of neck of left femur, subsequent encounter for closed fracture with routine healing: Secondary | ICD-10-CM | POA: Diagnosis not present

## 2017-10-31 DIAGNOSIS — M069 Rheumatoid arthritis, unspecified: Secondary | ICD-10-CM | POA: Diagnosis not present

## 2017-10-31 DIAGNOSIS — J449 Chronic obstructive pulmonary disease, unspecified: Secondary | ICD-10-CM | POA: Diagnosis not present

## 2017-10-31 DIAGNOSIS — N189 Chronic kidney disease, unspecified: Secondary | ICD-10-CM | POA: Diagnosis not present

## 2017-10-31 DIAGNOSIS — S79911A Unspecified injury of right hip, initial encounter: Secondary | ICD-10-CM | POA: Diagnosis not present

## 2017-11-02 DIAGNOSIS — S72002D Fracture of unspecified part of neck of left femur, subsequent encounter for closed fracture with routine healing: Secondary | ICD-10-CM | POA: Diagnosis not present

## 2017-11-21 DIAGNOSIS — S72002D Fracture of unspecified part of neck of left femur, subsequent encounter for closed fracture with routine healing: Secondary | ICD-10-CM | POA: Diagnosis not present

## 2017-11-21 DIAGNOSIS — M069 Rheumatoid arthritis, unspecified: Secondary | ICD-10-CM | POA: Diagnosis not present

## 2017-11-21 DIAGNOSIS — I130 Hypertensive heart and chronic kidney disease with heart failure and stage 1 through stage 4 chronic kidney disease, or unspecified chronic kidney disease: Secondary | ICD-10-CM | POA: Diagnosis not present

## 2017-11-21 DIAGNOSIS — D631 Anemia in chronic kidney disease: Secondary | ICD-10-CM | POA: Diagnosis not present

## 2017-11-21 DIAGNOSIS — I5030 Unspecified diastolic (congestive) heart failure: Secondary | ICD-10-CM | POA: Diagnosis not present

## 2017-11-21 DIAGNOSIS — E039 Hypothyroidism, unspecified: Secondary | ICD-10-CM | POA: Diagnosis not present

## 2017-11-21 DIAGNOSIS — N184 Chronic kidney disease, stage 4 (severe): Secondary | ICD-10-CM | POA: Diagnosis not present

## 2017-11-21 DIAGNOSIS — Z9181 History of falling: Secondary | ICD-10-CM | POA: Diagnosis not present

## 2017-11-21 DIAGNOSIS — I313 Pericardial effusion (noninflammatory): Secondary | ICD-10-CM | POA: Diagnosis not present

## 2017-11-21 DIAGNOSIS — J449 Chronic obstructive pulmonary disease, unspecified: Secondary | ICD-10-CM | POA: Diagnosis not present

## 2017-11-21 DIAGNOSIS — M810 Age-related osteoporosis without current pathological fracture: Secondary | ICD-10-CM | POA: Diagnosis not present

## 2017-11-21 DIAGNOSIS — Z602 Problems related to living alone: Secondary | ICD-10-CM | POA: Diagnosis not present

## 2017-11-24 DIAGNOSIS — M810 Age-related osteoporosis without current pathological fracture: Secondary | ICD-10-CM | POA: Diagnosis not present

## 2017-11-24 DIAGNOSIS — I130 Hypertensive heart and chronic kidney disease with heart failure and stage 1 through stage 4 chronic kidney disease, or unspecified chronic kidney disease: Secondary | ICD-10-CM | POA: Diagnosis not present

## 2017-11-24 DIAGNOSIS — S72002D Fracture of unspecified part of neck of left femur, subsequent encounter for closed fracture with routine healing: Secondary | ICD-10-CM | POA: Diagnosis not present

## 2017-11-24 DIAGNOSIS — Z9181 History of falling: Secondary | ICD-10-CM | POA: Diagnosis not present

## 2017-11-24 DIAGNOSIS — D631 Anemia in chronic kidney disease: Secondary | ICD-10-CM | POA: Diagnosis not present

## 2017-11-24 DIAGNOSIS — I5030 Unspecified diastolic (congestive) heart failure: Secondary | ICD-10-CM | POA: Diagnosis not present

## 2017-11-24 DIAGNOSIS — N184 Chronic kidney disease, stage 4 (severe): Secondary | ICD-10-CM | POA: Diagnosis not present

## 2017-11-24 DIAGNOSIS — Z602 Problems related to living alone: Secondary | ICD-10-CM | POA: Diagnosis not present

## 2017-11-24 DIAGNOSIS — M069 Rheumatoid arthritis, unspecified: Secondary | ICD-10-CM | POA: Diagnosis not present

## 2017-11-24 DIAGNOSIS — I313 Pericardial effusion (noninflammatory): Secondary | ICD-10-CM | POA: Diagnosis not present

## 2017-11-24 DIAGNOSIS — J449 Chronic obstructive pulmonary disease, unspecified: Secondary | ICD-10-CM | POA: Diagnosis not present

## 2017-11-24 DIAGNOSIS — E039 Hypothyroidism, unspecified: Secondary | ICD-10-CM | POA: Diagnosis not present

## 2017-11-25 DIAGNOSIS — N184 Chronic kidney disease, stage 4 (severe): Secondary | ICD-10-CM | POA: Diagnosis not present

## 2017-11-25 DIAGNOSIS — D631 Anemia in chronic kidney disease: Secondary | ICD-10-CM | POA: Diagnosis not present

## 2017-11-25 DIAGNOSIS — Z602 Problems related to living alone: Secondary | ICD-10-CM | POA: Diagnosis not present

## 2017-11-25 DIAGNOSIS — S72002D Fracture of unspecified part of neck of left femur, subsequent encounter for closed fracture with routine healing: Secondary | ICD-10-CM | POA: Diagnosis not present

## 2017-11-25 DIAGNOSIS — I5030 Unspecified diastolic (congestive) heart failure: Secondary | ICD-10-CM | POA: Diagnosis not present

## 2017-11-25 DIAGNOSIS — M810 Age-related osteoporosis without current pathological fracture: Secondary | ICD-10-CM | POA: Diagnosis not present

## 2017-11-25 DIAGNOSIS — J449 Chronic obstructive pulmonary disease, unspecified: Secondary | ICD-10-CM | POA: Diagnosis not present

## 2017-11-25 DIAGNOSIS — M069 Rheumatoid arthritis, unspecified: Secondary | ICD-10-CM | POA: Diagnosis not present

## 2017-11-25 DIAGNOSIS — Z9181 History of falling: Secondary | ICD-10-CM | POA: Diagnosis not present

## 2017-11-25 DIAGNOSIS — E039 Hypothyroidism, unspecified: Secondary | ICD-10-CM | POA: Diagnosis not present

## 2017-11-25 DIAGNOSIS — I313 Pericardial effusion (noninflammatory): Secondary | ICD-10-CM | POA: Diagnosis not present

## 2017-11-25 DIAGNOSIS — I130 Hypertensive heart and chronic kidney disease with heart failure and stage 1 through stage 4 chronic kidney disease, or unspecified chronic kidney disease: Secondary | ICD-10-CM | POA: Diagnosis not present

## 2017-11-26 DIAGNOSIS — I130 Hypertensive heart and chronic kidney disease with heart failure and stage 1 through stage 4 chronic kidney disease, or unspecified chronic kidney disease: Secondary | ICD-10-CM | POA: Diagnosis not present

## 2017-11-26 DIAGNOSIS — E039 Hypothyroidism, unspecified: Secondary | ICD-10-CM | POA: Diagnosis not present

## 2017-11-26 DIAGNOSIS — N184 Chronic kidney disease, stage 4 (severe): Secondary | ICD-10-CM | POA: Diagnosis not present

## 2017-11-26 DIAGNOSIS — J449 Chronic obstructive pulmonary disease, unspecified: Secondary | ICD-10-CM | POA: Diagnosis not present

## 2017-11-26 DIAGNOSIS — D631 Anemia in chronic kidney disease: Secondary | ICD-10-CM | POA: Diagnosis not present

## 2017-11-26 DIAGNOSIS — Z602 Problems related to living alone: Secondary | ICD-10-CM | POA: Diagnosis not present

## 2017-11-26 DIAGNOSIS — I5030 Unspecified diastolic (congestive) heart failure: Secondary | ICD-10-CM | POA: Diagnosis not present

## 2017-11-26 DIAGNOSIS — Z9181 History of falling: Secondary | ICD-10-CM | POA: Diagnosis not present

## 2017-11-26 DIAGNOSIS — M810 Age-related osteoporosis without current pathological fracture: Secondary | ICD-10-CM | POA: Diagnosis not present

## 2017-11-26 DIAGNOSIS — M069 Rheumatoid arthritis, unspecified: Secondary | ICD-10-CM | POA: Diagnosis not present

## 2017-11-26 DIAGNOSIS — I313 Pericardial effusion (noninflammatory): Secondary | ICD-10-CM | POA: Diagnosis not present

## 2017-11-26 DIAGNOSIS — S72002D Fracture of unspecified part of neck of left femur, subsequent encounter for closed fracture with routine healing: Secondary | ICD-10-CM | POA: Diagnosis not present

## 2017-11-29 DIAGNOSIS — I130 Hypertensive heart and chronic kidney disease with heart failure and stage 1 through stage 4 chronic kidney disease, or unspecified chronic kidney disease: Secondary | ICD-10-CM | POA: Diagnosis not present

## 2017-11-29 DIAGNOSIS — J449 Chronic obstructive pulmonary disease, unspecified: Secondary | ICD-10-CM | POA: Diagnosis not present

## 2017-11-29 DIAGNOSIS — S72002D Fracture of unspecified part of neck of left femur, subsequent encounter for closed fracture with routine healing: Secondary | ICD-10-CM | POA: Diagnosis not present

## 2017-11-29 DIAGNOSIS — E039 Hypothyroidism, unspecified: Secondary | ICD-10-CM | POA: Diagnosis not present

## 2017-11-29 DIAGNOSIS — Z9181 History of falling: Secondary | ICD-10-CM | POA: Diagnosis not present

## 2017-11-29 DIAGNOSIS — N184 Chronic kidney disease, stage 4 (severe): Secondary | ICD-10-CM | POA: Diagnosis not present

## 2017-11-29 DIAGNOSIS — I313 Pericardial effusion (noninflammatory): Secondary | ICD-10-CM | POA: Diagnosis not present

## 2017-11-29 DIAGNOSIS — Z602 Problems related to living alone: Secondary | ICD-10-CM | POA: Diagnosis not present

## 2017-11-29 DIAGNOSIS — M069 Rheumatoid arthritis, unspecified: Secondary | ICD-10-CM | POA: Diagnosis not present

## 2017-11-29 DIAGNOSIS — D631 Anemia in chronic kidney disease: Secondary | ICD-10-CM | POA: Diagnosis not present

## 2017-11-29 DIAGNOSIS — M810 Age-related osteoporosis without current pathological fracture: Secondary | ICD-10-CM | POA: Diagnosis not present

## 2017-11-29 DIAGNOSIS — I5030 Unspecified diastolic (congestive) heart failure: Secondary | ICD-10-CM | POA: Diagnosis not present

## 2017-11-30 DIAGNOSIS — D631 Anemia in chronic kidney disease: Secondary | ICD-10-CM | POA: Diagnosis not present

## 2017-11-30 DIAGNOSIS — I5030 Unspecified diastolic (congestive) heart failure: Secondary | ICD-10-CM | POA: Diagnosis not present

## 2017-11-30 DIAGNOSIS — M810 Age-related osteoporosis without current pathological fracture: Secondary | ICD-10-CM | POA: Diagnosis not present

## 2017-11-30 DIAGNOSIS — M069 Rheumatoid arthritis, unspecified: Secondary | ICD-10-CM | POA: Diagnosis not present

## 2017-11-30 DIAGNOSIS — N184 Chronic kidney disease, stage 4 (severe): Secondary | ICD-10-CM | POA: Diagnosis not present

## 2017-11-30 DIAGNOSIS — Z602 Problems related to living alone: Secondary | ICD-10-CM | POA: Diagnosis not present

## 2017-11-30 DIAGNOSIS — I313 Pericardial effusion (noninflammatory): Secondary | ICD-10-CM | POA: Diagnosis not present

## 2017-11-30 DIAGNOSIS — J449 Chronic obstructive pulmonary disease, unspecified: Secondary | ICD-10-CM | POA: Diagnosis not present

## 2017-11-30 DIAGNOSIS — I130 Hypertensive heart and chronic kidney disease with heart failure and stage 1 through stage 4 chronic kidney disease, or unspecified chronic kidney disease: Secondary | ICD-10-CM | POA: Diagnosis not present

## 2017-11-30 DIAGNOSIS — Z9181 History of falling: Secondary | ICD-10-CM | POA: Diagnosis not present

## 2017-11-30 DIAGNOSIS — S72002D Fracture of unspecified part of neck of left femur, subsequent encounter for closed fracture with routine healing: Secondary | ICD-10-CM | POA: Diagnosis not present

## 2017-11-30 DIAGNOSIS — E039 Hypothyroidism, unspecified: Secondary | ICD-10-CM | POA: Diagnosis not present

## 2017-12-02 DIAGNOSIS — Z602 Problems related to living alone: Secondary | ICD-10-CM | POA: Diagnosis not present

## 2017-12-02 DIAGNOSIS — I313 Pericardial effusion (noninflammatory): Secondary | ICD-10-CM | POA: Diagnosis not present

## 2017-12-02 DIAGNOSIS — N184 Chronic kidney disease, stage 4 (severe): Secondary | ICD-10-CM | POA: Diagnosis not present

## 2017-12-02 DIAGNOSIS — J449 Chronic obstructive pulmonary disease, unspecified: Secondary | ICD-10-CM | POA: Diagnosis not present

## 2017-12-02 DIAGNOSIS — M810 Age-related osteoporosis without current pathological fracture: Secondary | ICD-10-CM | POA: Diagnosis not present

## 2017-12-02 DIAGNOSIS — I5030 Unspecified diastolic (congestive) heart failure: Secondary | ICD-10-CM | POA: Diagnosis not present

## 2017-12-02 DIAGNOSIS — Z79899 Other long term (current) drug therapy: Secondary | ICD-10-CM | POA: Diagnosis not present

## 2017-12-02 DIAGNOSIS — Z9181 History of falling: Secondary | ICD-10-CM | POA: Diagnosis not present

## 2017-12-02 DIAGNOSIS — Z9889 Other specified postprocedural states: Secondary | ICD-10-CM | POA: Diagnosis not present

## 2017-12-02 DIAGNOSIS — S72002D Fracture of unspecified part of neck of left femur, subsequent encounter for closed fracture with routine healing: Secondary | ICD-10-CM | POA: Diagnosis not present

## 2017-12-02 DIAGNOSIS — I130 Hypertensive heart and chronic kidney disease with heart failure and stage 1 through stage 4 chronic kidney disease, or unspecified chronic kidney disease: Secondary | ICD-10-CM | POA: Diagnosis not present

## 2017-12-02 DIAGNOSIS — E213 Hyperparathyroidism, unspecified: Secondary | ICD-10-CM | POA: Diagnosis not present

## 2017-12-02 DIAGNOSIS — E039 Hypothyroidism, unspecified: Secondary | ICD-10-CM | POA: Diagnosis not present

## 2017-12-02 DIAGNOSIS — D631 Anemia in chronic kidney disease: Secondary | ICD-10-CM | POA: Diagnosis not present

## 2017-12-02 DIAGNOSIS — M069 Rheumatoid arthritis, unspecified: Secondary | ICD-10-CM | POA: Diagnosis not present

## 2017-12-07 DIAGNOSIS — E039 Hypothyroidism, unspecified: Secondary | ICD-10-CM | POA: Diagnosis not present

## 2017-12-07 DIAGNOSIS — Z9181 History of falling: Secondary | ICD-10-CM | POA: Diagnosis not present

## 2017-12-07 DIAGNOSIS — M069 Rheumatoid arthritis, unspecified: Secondary | ICD-10-CM | POA: Diagnosis not present

## 2017-12-07 DIAGNOSIS — M810 Age-related osteoporosis without current pathological fracture: Secondary | ICD-10-CM | POA: Diagnosis not present

## 2017-12-07 DIAGNOSIS — I5030 Unspecified diastolic (congestive) heart failure: Secondary | ICD-10-CM | POA: Diagnosis not present

## 2017-12-07 DIAGNOSIS — I130 Hypertensive heart and chronic kidney disease with heart failure and stage 1 through stage 4 chronic kidney disease, or unspecified chronic kidney disease: Secondary | ICD-10-CM | POA: Diagnosis not present

## 2017-12-07 DIAGNOSIS — I313 Pericardial effusion (noninflammatory): Secondary | ICD-10-CM | POA: Diagnosis not present

## 2017-12-07 DIAGNOSIS — J449 Chronic obstructive pulmonary disease, unspecified: Secondary | ICD-10-CM | POA: Diagnosis not present

## 2017-12-07 DIAGNOSIS — D631 Anemia in chronic kidney disease: Secondary | ICD-10-CM | POA: Diagnosis not present

## 2017-12-07 DIAGNOSIS — S72002D Fracture of unspecified part of neck of left femur, subsequent encounter for closed fracture with routine healing: Secondary | ICD-10-CM | POA: Diagnosis not present

## 2017-12-07 DIAGNOSIS — Z602 Problems related to living alone: Secondary | ICD-10-CM | POA: Diagnosis not present

## 2017-12-07 DIAGNOSIS — N184 Chronic kidney disease, stage 4 (severe): Secondary | ICD-10-CM | POA: Diagnosis not present

## 2017-12-09 DIAGNOSIS — S72002D Fracture of unspecified part of neck of left femur, subsequent encounter for closed fracture with routine healing: Secondary | ICD-10-CM | POA: Diagnosis not present

## 2017-12-09 DIAGNOSIS — J449 Chronic obstructive pulmonary disease, unspecified: Secondary | ICD-10-CM | POA: Diagnosis not present

## 2017-12-09 DIAGNOSIS — D631 Anemia in chronic kidney disease: Secondary | ICD-10-CM | POA: Diagnosis not present

## 2017-12-09 DIAGNOSIS — N184 Chronic kidney disease, stage 4 (severe): Secondary | ICD-10-CM | POA: Diagnosis not present

## 2017-12-09 DIAGNOSIS — E039 Hypothyroidism, unspecified: Secondary | ICD-10-CM | POA: Diagnosis not present

## 2017-12-09 DIAGNOSIS — M810 Age-related osteoporosis without current pathological fracture: Secondary | ICD-10-CM | POA: Diagnosis not present

## 2017-12-09 DIAGNOSIS — M069 Rheumatoid arthritis, unspecified: Secondary | ICD-10-CM | POA: Diagnosis not present

## 2017-12-09 DIAGNOSIS — I130 Hypertensive heart and chronic kidney disease with heart failure and stage 1 through stage 4 chronic kidney disease, or unspecified chronic kidney disease: Secondary | ICD-10-CM | POA: Diagnosis not present

## 2017-12-09 DIAGNOSIS — Z602 Problems related to living alone: Secondary | ICD-10-CM | POA: Diagnosis not present

## 2017-12-09 DIAGNOSIS — Z9181 History of falling: Secondary | ICD-10-CM | POA: Diagnosis not present

## 2017-12-09 DIAGNOSIS — I313 Pericardial effusion (noninflammatory): Secondary | ICD-10-CM | POA: Diagnosis not present

## 2017-12-09 DIAGNOSIS — I5030 Unspecified diastolic (congestive) heart failure: Secondary | ICD-10-CM | POA: Diagnosis not present

## 2017-12-10 DIAGNOSIS — S72002D Fracture of unspecified part of neck of left femur, subsequent encounter for closed fracture with routine healing: Secondary | ICD-10-CM | POA: Diagnosis not present

## 2017-12-14 DIAGNOSIS — D631 Anemia in chronic kidney disease: Secondary | ICD-10-CM | POA: Diagnosis not present

## 2017-12-14 DIAGNOSIS — S72002D Fracture of unspecified part of neck of left femur, subsequent encounter for closed fracture with routine healing: Secondary | ICD-10-CM | POA: Diagnosis not present

## 2017-12-14 DIAGNOSIS — N184 Chronic kidney disease, stage 4 (severe): Secondary | ICD-10-CM | POA: Diagnosis not present

## 2017-12-14 DIAGNOSIS — M810 Age-related osteoporosis without current pathological fracture: Secondary | ICD-10-CM | POA: Diagnosis not present

## 2017-12-14 DIAGNOSIS — I313 Pericardial effusion (noninflammatory): Secondary | ICD-10-CM | POA: Diagnosis not present

## 2017-12-14 DIAGNOSIS — E039 Hypothyroidism, unspecified: Secondary | ICD-10-CM | POA: Diagnosis not present

## 2017-12-14 DIAGNOSIS — J449 Chronic obstructive pulmonary disease, unspecified: Secondary | ICD-10-CM | POA: Diagnosis not present

## 2017-12-14 DIAGNOSIS — Z9181 History of falling: Secondary | ICD-10-CM | POA: Diagnosis not present

## 2017-12-14 DIAGNOSIS — Z602 Problems related to living alone: Secondary | ICD-10-CM | POA: Diagnosis not present

## 2017-12-14 DIAGNOSIS — I130 Hypertensive heart and chronic kidney disease with heart failure and stage 1 through stage 4 chronic kidney disease, or unspecified chronic kidney disease: Secondary | ICD-10-CM | POA: Diagnosis not present

## 2017-12-14 DIAGNOSIS — M069 Rheumatoid arthritis, unspecified: Secondary | ICD-10-CM | POA: Diagnosis not present

## 2017-12-14 DIAGNOSIS — I5030 Unspecified diastolic (congestive) heart failure: Secondary | ICD-10-CM | POA: Diagnosis not present

## 2017-12-16 DIAGNOSIS — I5030 Unspecified diastolic (congestive) heart failure: Secondary | ICD-10-CM | POA: Diagnosis not present

## 2017-12-16 DIAGNOSIS — D631 Anemia in chronic kidney disease: Secondary | ICD-10-CM | POA: Diagnosis not present

## 2017-12-16 DIAGNOSIS — E039 Hypothyroidism, unspecified: Secondary | ICD-10-CM | POA: Diagnosis not present

## 2017-12-16 DIAGNOSIS — I313 Pericardial effusion (noninflammatory): Secondary | ICD-10-CM | POA: Diagnosis not present

## 2017-12-16 DIAGNOSIS — Z602 Problems related to living alone: Secondary | ICD-10-CM | POA: Diagnosis not present

## 2017-12-16 DIAGNOSIS — M069 Rheumatoid arthritis, unspecified: Secondary | ICD-10-CM | POA: Diagnosis not present

## 2017-12-16 DIAGNOSIS — J449 Chronic obstructive pulmonary disease, unspecified: Secondary | ICD-10-CM | POA: Diagnosis not present

## 2017-12-16 DIAGNOSIS — S72002D Fracture of unspecified part of neck of left femur, subsequent encounter for closed fracture with routine healing: Secondary | ICD-10-CM | POA: Diagnosis not present

## 2017-12-16 DIAGNOSIS — Z9181 History of falling: Secondary | ICD-10-CM | POA: Diagnosis not present

## 2017-12-16 DIAGNOSIS — N184 Chronic kidney disease, stage 4 (severe): Secondary | ICD-10-CM | POA: Diagnosis not present

## 2017-12-16 DIAGNOSIS — M810 Age-related osteoporosis without current pathological fracture: Secondary | ICD-10-CM | POA: Diagnosis not present

## 2017-12-16 DIAGNOSIS — I130 Hypertensive heart and chronic kidney disease with heart failure and stage 1 through stage 4 chronic kidney disease, or unspecified chronic kidney disease: Secondary | ICD-10-CM | POA: Diagnosis not present

## 2017-12-21 DIAGNOSIS — E039 Hypothyroidism, unspecified: Secondary | ICD-10-CM | POA: Diagnosis not present

## 2017-12-21 DIAGNOSIS — N184 Chronic kidney disease, stage 4 (severe): Secondary | ICD-10-CM | POA: Diagnosis not present

## 2017-12-21 DIAGNOSIS — S72002D Fracture of unspecified part of neck of left femur, subsequent encounter for closed fracture with routine healing: Secondary | ICD-10-CM | POA: Diagnosis not present

## 2017-12-21 DIAGNOSIS — D631 Anemia in chronic kidney disease: Secondary | ICD-10-CM | POA: Diagnosis not present

## 2017-12-21 DIAGNOSIS — Z9181 History of falling: Secondary | ICD-10-CM | POA: Diagnosis not present

## 2017-12-21 DIAGNOSIS — J449 Chronic obstructive pulmonary disease, unspecified: Secondary | ICD-10-CM | POA: Diagnosis not present

## 2017-12-21 DIAGNOSIS — I5030 Unspecified diastolic (congestive) heart failure: Secondary | ICD-10-CM | POA: Diagnosis not present

## 2017-12-21 DIAGNOSIS — I130 Hypertensive heart and chronic kidney disease with heart failure and stage 1 through stage 4 chronic kidney disease, or unspecified chronic kidney disease: Secondary | ICD-10-CM | POA: Diagnosis not present

## 2017-12-21 DIAGNOSIS — I313 Pericardial effusion (noninflammatory): Secondary | ICD-10-CM | POA: Diagnosis not present

## 2017-12-21 DIAGNOSIS — Z602 Problems related to living alone: Secondary | ICD-10-CM | POA: Diagnosis not present

## 2017-12-21 DIAGNOSIS — M069 Rheumatoid arthritis, unspecified: Secondary | ICD-10-CM | POA: Diagnosis not present

## 2017-12-21 DIAGNOSIS — M810 Age-related osteoporosis without current pathological fracture: Secondary | ICD-10-CM | POA: Diagnosis not present

## 2017-12-22 DIAGNOSIS — M810 Age-related osteoporosis without current pathological fracture: Secondary | ICD-10-CM | POA: Diagnosis not present

## 2017-12-22 DIAGNOSIS — S72002D Fracture of unspecified part of neck of left femur, subsequent encounter for closed fracture with routine healing: Secondary | ICD-10-CM | POA: Diagnosis not present

## 2017-12-22 DIAGNOSIS — D631 Anemia in chronic kidney disease: Secondary | ICD-10-CM | POA: Diagnosis not present

## 2017-12-22 DIAGNOSIS — I130 Hypertensive heart and chronic kidney disease with heart failure and stage 1 through stage 4 chronic kidney disease, or unspecified chronic kidney disease: Secondary | ICD-10-CM | POA: Diagnosis not present

## 2017-12-22 DIAGNOSIS — N184 Chronic kidney disease, stage 4 (severe): Secondary | ICD-10-CM | POA: Diagnosis not present

## 2017-12-22 DIAGNOSIS — Z602 Problems related to living alone: Secondary | ICD-10-CM | POA: Diagnosis not present

## 2017-12-22 DIAGNOSIS — I313 Pericardial effusion (noninflammatory): Secondary | ICD-10-CM | POA: Diagnosis not present

## 2017-12-22 DIAGNOSIS — Z9181 History of falling: Secondary | ICD-10-CM | POA: Diagnosis not present

## 2017-12-22 DIAGNOSIS — E039 Hypothyroidism, unspecified: Secondary | ICD-10-CM | POA: Diagnosis not present

## 2017-12-22 DIAGNOSIS — M069 Rheumatoid arthritis, unspecified: Secondary | ICD-10-CM | POA: Diagnosis not present

## 2017-12-22 DIAGNOSIS — J449 Chronic obstructive pulmonary disease, unspecified: Secondary | ICD-10-CM | POA: Diagnosis not present

## 2017-12-22 DIAGNOSIS — I5030 Unspecified diastolic (congestive) heart failure: Secondary | ICD-10-CM | POA: Diagnosis not present

## 2017-12-28 DIAGNOSIS — N189 Chronic kidney disease, unspecified: Secondary | ICD-10-CM | POA: Diagnosis not present

## 2017-12-28 DIAGNOSIS — D631 Anemia in chronic kidney disease: Secondary | ICD-10-CM | POA: Diagnosis not present

## 2017-12-29 DIAGNOSIS — N189 Chronic kidney disease, unspecified: Secondary | ICD-10-CM | POA: Diagnosis not present

## 2017-12-29 DIAGNOSIS — D631 Anemia in chronic kidney disease: Secondary | ICD-10-CM | POA: Diagnosis not present

## 2018-01-07 DIAGNOSIS — J069 Acute upper respiratory infection, unspecified: Secondary | ICD-10-CM | POA: Diagnosis not present

## 2018-01-07 DIAGNOSIS — J0100 Acute maxillary sinusitis, unspecified: Secondary | ICD-10-CM | POA: Diagnosis not present

## 2018-01-07 DIAGNOSIS — Z6820 Body mass index (BMI) 20.0-20.9, adult: Secondary | ICD-10-CM | POA: Diagnosis not present

## 2018-01-10 DIAGNOSIS — I129 Hypertensive chronic kidney disease with stage 1 through stage 4 chronic kidney disease, or unspecified chronic kidney disease: Secondary | ICD-10-CM | POA: Diagnosis not present

## 2018-01-10 DIAGNOSIS — N2581 Secondary hyperparathyroidism of renal origin: Secondary | ICD-10-CM | POA: Diagnosis not present

## 2018-01-10 DIAGNOSIS — D631 Anemia in chronic kidney disease: Secondary | ICD-10-CM | POA: Diagnosis not present

## 2018-01-10 DIAGNOSIS — M109 Gout, unspecified: Secondary | ICD-10-CM | POA: Diagnosis not present

## 2018-01-10 DIAGNOSIS — N184 Chronic kidney disease, stage 4 (severe): Secondary | ICD-10-CM | POA: Diagnosis not present

## 2018-01-14 DIAGNOSIS — N184 Chronic kidney disease, stage 4 (severe): Secondary | ICD-10-CM | POA: Diagnosis not present

## 2018-01-18 ENCOUNTER — Other Ambulatory Visit: Payer: Self-pay

## 2018-01-18 DIAGNOSIS — Z01812 Encounter for preprocedural laboratory examination: Secondary | ICD-10-CM

## 2018-01-18 DIAGNOSIS — N185 Chronic kidney disease, stage 5: Secondary | ICD-10-CM

## 2018-01-19 DIAGNOSIS — M1712 Unilateral primary osteoarthritis, left knee: Secondary | ICD-10-CM | POA: Diagnosis not present

## 2018-01-19 DIAGNOSIS — S72002D Fracture of unspecified part of neck of left femur, subsequent encounter for closed fracture with routine healing: Secondary | ICD-10-CM | POA: Diagnosis not present

## 2018-01-19 DIAGNOSIS — Z96649 Presence of unspecified artificial hip joint: Secondary | ICD-10-CM | POA: Diagnosis not present

## 2018-01-27 ENCOUNTER — Ambulatory Visit: Payer: Medicare Other | Admitting: Vascular Surgery

## 2018-01-27 ENCOUNTER — Ambulatory Visit (INDEPENDENT_AMBULATORY_CARE_PROVIDER_SITE_OTHER)
Admission: RE | Admit: 2018-01-27 | Discharge: 2018-01-27 | Disposition: A | Discharge status: Home / Self Care | Payer: Medicare Other | Source: Ambulatory Visit | Attending: Vascular Surgery | Admitting: Vascular Surgery

## 2018-01-27 ENCOUNTER — Ambulatory Visit (HOSPITAL_COMMUNITY)
Admission: RE | Admit: 2018-01-27 | Discharge: 2018-01-27 | Disposition: A | Discharge status: Home / Self Care | Payer: Medicare Other | Source: Ambulatory Visit | Attending: Vascular Surgery | Admitting: Vascular Surgery

## 2018-01-27 ENCOUNTER — Other Ambulatory Visit: Payer: Self-pay

## 2018-01-27 ENCOUNTER — Encounter: Payer: Self-pay | Admitting: Vascular Surgery

## 2018-01-27 VITALS — BP 194/85 | HR 74 | Temp 97.0°F | Resp 16 | Ht 66.0 in | Wt 129.0 lb

## 2018-01-27 DIAGNOSIS — Z01812 Encounter for preprocedural laboratory examination: Secondary | ICD-10-CM | POA: Diagnosis not present

## 2018-01-27 DIAGNOSIS — N185 Chronic kidney disease, stage 5: Secondary | ICD-10-CM

## 2018-01-27 DIAGNOSIS — I10 Essential (primary) hypertension: Secondary | ICD-10-CM | POA: Insufficient documentation

## 2018-01-27 DIAGNOSIS — N184 Chronic kidney disease, stage 4 (severe): Secondary | ICD-10-CM

## 2018-01-27 NOTE — Progress Notes (Signed)
Vitals:   01/27/18 1346  BP: (!) 178/80  Pulse: 73  Resp: 16  Temp: (!) 97 F (36.1 C)  TempSrc: Oral  SpO2: 100%  Weight: 129 lb (58.5 kg)  Height: 5\' 6"  (1.676 m)

## 2018-01-27 NOTE — Progress Notes (Signed)
History of Present Illness:  Patient is a 84 y.o. year old female who presents for placement of a permanent hemodialysis access. The patient is right handed .  The patient is not currently on hemodialysis.  She was seen by Dr. Hart Rochester in 2013.  At that time a right brachial cephalic fistula was created.  She has not had to be on HD to date.  The fistula has since thrombosed.  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    Other chronic medical problems include RA, HTN, pericardial effusion unchanged > 1 year, and anemia. .  Past Medical History:  Diagnosis Date  . Anemia   . Arthritis   . Chronic kidney disease   . Gout   . Hypertension     Past Surgical History:  Procedure Laterality Date  . ABDOMINAL HYSTERECTOMY    . AV FISTULA PLACEMENT  06/27/2009   right upper arm BC vein AVF  . CATARACT EXTRACTION, BILATERAL  2012  . JOINT REPLACEMENT  1995   right hip     Social History Social History   Tobacco Use  . Smoking status: Never Smoker  . Smokeless tobacco: Never Used  Substance Use Topics  . Alcohol use: No  . Drug use: No    Family History Family History  Problem Relation Age of Onset  . Heart disease Mother   . Heart attack Father   . Cancer Sister   . Other Sister        varicose veins  . Diabetes Brother   . Heart attack Daughter     Allergies  Allergies  Allergen Reactions  . Levaquin [Levofloxacin In D5w] Other (See Comments)    Per patient this caused insomnia   . Tape Other (See Comments)    Tears skin - please use paper tape     Current Outpatient Medications  Medication Sig Dispense Refill  . acetaminophen (TYLENOL) 500 MG tablet Take 1,000 mg by mouth See admin instructions. Take 2 tablets (1000 mg) by mouth daily at bedtime, may also take 2 tablets during the day as needed for arthritis pain    . allopurinol (ZYLOPRIM) 100 MG tablet Take 100 mg by mouth daily.     . calcitRIOL (ROCALTROL) 0.25 MCG capsule  Take 0.25 mcg by mouth daily. Monday and Friday    . cholecalciferol (VITAMIN D) 1000 UNITS tablet Take 1,000 Units by mouth daily with supper.     . cloNIDine (CATAPRES) 0.2 MG tablet Take 0.2 mg by mouth 2 (two) times daily with a meal.     . Epoetin Alfa (PROCRIT IJ) Inject as directed as needed.    . folic acid (FOLVITE) 1 MG tablet Take 1 mg by mouth daily.    . furosemide (LASIX) 20 MG tablet Take 20 mg by mouth 2 (two) times daily.    Marland Kitchen levothyroxine (SYNTHROID, LEVOTHROID) 75 MCG tablet Take 75 mcg by mouth at bedtime.     . methylPREDNISolone (MEDROL) 4 MG tablet Take 1 tablet (4 mg total) by mouth daily. Continuous course for arthritis pain. HOLD WHILE YOU ARE ON HIGHER DOSE PREDNISONE AND TAPER IS COMPLETED.    Marland Kitchen ranitidine (ZANTAC) 75 MG tablet Take 75 mg by mouth 2 (two) times daily.    . sodium bicarbonate 650 MG tablet Take 650 mg by mouth daily.     Marland Kitchen amLODipine (NORVASC) 10 MG tablet Take 1 tablet (10 mg total) by mouth daily. (Patient  not taking: Reported on 01/27/2018) 90 tablet 1  . zolpidem (AMBIEN) 5 MG tablet Take by mouth as directed. Pt takes a half tablet     No current facility-administered medications for this visit.     ROS:   General:  No weight loss, Fever, chills  HEENT: No recent headaches, no nasal bleeding, no visual changes, no sore throat  Neurologic: No dizziness, blackouts, seizures. No recent symptoms of stroke or mini- stroke. No recent episodes of slurred speech, or temporary blindness.  Cardiac: No recent episodes of chest pain/pressure, no shortness of breath at rest.  No shortness of breath with exertion.  Denies history of atrial fibrillation or irregular heartbeat  Vascular: No history of rest pain in feet.  No history of claudication.  No history of non-healing ulcer, No history of DVT   Pulmonary: No home oxygen, no productive cough, no hemoptysis,  No asthma or wheezing  Musculoskeletal:  [x ] Arthritis, [ ]  Low back pain,  [x ] Joint  pain  Hematologic:No history of hypercoagulable state.  No history of easy bleeding.  positive history of anemia  Gastrointestinal: No hematochezia or melena,  No gastroesophageal reflux, no trouble swallowing  Urinary: [x ] chronic Kidney disease, [ ]  on HD - [ ]  MWF or [ ]  TTHS, [ ]  Burning with urination, [ ]  Frequent urination, [ ]  Difficulty urinating;   Skin: No rashes  Psychological: No history of anxiety,  No history of depression   Physical Examination  Vitals:   01/27/18 1346 01/27/18 1353  BP: (!) 178/80 (!) 194/85  Pulse: 73 74  Resp: 16   Temp: (!) 97 F (36.1 C)   TempSrc: Oral   SpO2: 100%   Weight: 129 lb (58.5 kg)   Height: 5\' 6"  (1.676 m)     Body mass index is 20.82 kg/m.  General:  Alert and oriented, no acute distress HEENT: Normal Neck: No bruit or JVD Pulmonary: Clear to auscultation bilaterally Cardiac: Regular Rate and Rhythm without murmur Gastrointestinal: Soft, non-tender, non-distended, no mass, no scars Skin: No rash Extremity Pulses:  2+ radial, brachial pulses bilaterally Musculoskeletal: Lower extremity  edema  Neurologic: Upper and lower extremity motor 5/5 and symmetric  DATA:  Vein mapping and arterial UE duplex 01/27/2018 She has very small veins in B UE Her arterial flow is triphasic with sufficient size B. Brachials > 0.43 cm   ASSESSMENT:  CKD stage IV/V   PLAN: We reviewed her vein mapping and discovered she has insufficient vein size for fistula creation.  We recommend creation of left AV graft for HD access.  We will plan surgery in the near future when Dr. has advised she is within 4 weeks of needing HD.    PA-C [facility]  The patient was seen in conjunction with Dr. 01/29/18 today  History and exam findings as above.  Patient has fairly small and adequate veins for creation of a fistula.  I believe the best option for her would be an upper arm graft  on the left side when she approaches the eminent dialysis.  We will await word from Dr. 01/29/18 on scheduling.  The patient will otherwise follow-up on an as-needed basis.  Risk benefits possible complications and procedure details were explained to the patient today including but not limited to bleeding infection ischemic steal graft thrombosis need for other procedures.  She understands and agrees to proceed if she requires it.   Fields, MD [facility] [place] Office: [phone] Pager: [phone]

## 2018-01-27 NOTE — H&P (View-Only) (Signed)
   History of Present Illness:  Patient is a 84 y.o. year old female who presents for placement of a permanent hemodialysis access. The patient is right handed .  The patient is not currently on hemodialysis.  She was seen by Dr. Lawson in 2013.  At that time a right brachial cephalic fistula was created.  She has not had to be on HD to date.  The fistula has since thrombosed.  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    Other chronic medical problems include RA, HTN, pericardial effusion unchanged > 1 year, and anemia. .  Past Medical History:  Diagnosis Date  . Anemia   . Arthritis   . Chronic kidney disease   . Gout   . Hypertension     Past Surgical History:  Procedure Laterality Date  . ABDOMINAL HYSTERECTOMY    . AV FISTULA PLACEMENT  06/27/2009   right upper arm BC vein AVF  . CATARACT EXTRACTION, BILATERAL  2012  . JOINT REPLACEMENT  1995   right hip     Social History Social History   Tobacco Use  . Smoking status: Never Smoker  . Smokeless tobacco: Never Used  Substance Use Topics  . Alcohol use: No  . Drug use: No    Family History Family History  Problem Relation Age of Onset  . Heart disease Mother   . Heart attack Father   . Cancer Sister   . Other Sister        varicose veins  . Diabetes Brother   . Heart attack Daughter     Allergies  Allergies  Allergen Reactions  . Levaquin [Levofloxacin In D5w] Other (See Comments)    Per patient this caused insomnia   . Tape Other (See Comments)    Tears skin - please use paper tape     Current Outpatient Medications  Medication Sig Dispense Refill  . acetaminophen (TYLENOL) 500 MG tablet Take 1,000 mg by mouth See admin instructions. Take 2 tablets (1000 mg) by mouth daily at bedtime, may also take 2 tablets during the day as needed for arthritis pain    . allopurinol (ZYLOPRIM) 100 MG tablet Take 100 mg by mouth daily.     . calcitRIOL (ROCALTROL) 0.25 MCG capsule  Take 0.25 mcg by mouth daily. Monday and Friday    . cholecalciferol (VITAMIN D) 1000 UNITS tablet Take 1,000 Units by mouth daily with supper.     . cloNIDine (CATAPRES) 0.2 MG tablet Take 0.2 mg by mouth 2 (two) times daily with a meal.     . Epoetin Alfa (PROCRIT IJ) Inject as directed as needed.    . folic acid (FOLVITE) 1 MG tablet Take 1 mg by mouth daily.    . furosemide (LASIX) 20 MG tablet Take 20 mg by mouth 2 (two) times daily.    . levothyroxine (SYNTHROID, LEVOTHROID) 75 MCG tablet Take 75 mcg by mouth at bedtime.     . methylPREDNISolone (MEDROL) 4 MG tablet Take 1 tablet (4 mg total) by mouth daily. Continuous course for arthritis pain. HOLD WHILE YOU ARE ON HIGHER DOSE PREDNISONE AND TAPER IS COMPLETED.    . ranitidine (ZANTAC) 75 MG tablet Take 75 mg by mouth 2 (two) times daily.    . sodium bicarbonate 650 MG tablet Take 650 mg by mouth daily.     . amLODipine (NORVASC) 10 MG tablet Take 1 tablet (10 mg total) by mouth daily. (Patient   not taking: Reported on 01/27/2018) 90 tablet 1  . zolpidem (AMBIEN) 5 MG tablet Take by mouth as directed. Pt takes a half tablet     No current facility-administered medications for this visit.     ROS:   General:  No weight loss, Fever, chills  HEENT: No recent headaches, no nasal bleeding, no visual changes, no sore throat  Neurologic: No dizziness, blackouts, seizures. No recent symptoms of stroke or mini- stroke. No recent episodes of slurred speech, or temporary blindness.  Cardiac: No recent episodes of chest pain/pressure, no shortness of breath at rest.  No shortness of breath with exertion.  Denies history of atrial fibrillation or irregular heartbeat  Vascular: No history of rest pain in feet.  No history of claudication.  No history of non-healing ulcer, No history of DVT   Pulmonary: No home oxygen, no productive cough, no hemoptysis,  No asthma or wheezing  Musculoskeletal:  [x ] Arthritis, [ ]  Low back pain,  [x ] Joint  pain  Hematologic:No history of hypercoagulable state.  No history of easy bleeding.  positive history of anemia  Gastrointestinal: No hematochezia or melena,  No gastroesophageal reflux, no trouble swallowing  Urinary: [x ] chronic Kidney disease, [ ]  on HD - [ ]  MWF or [ ]  TTHS, [ ]  Burning with urination, [ ]  Frequent urination, [ ]  Difficulty urinating;   Skin: No rashes  Psychological: No history of anxiety,  No history of depression   Physical Examination  Vitals:   01/27/18 1346 01/27/18 1353  BP: (!) 178/80 (!) 194/85  Pulse: 73 74  Resp: 16   Temp: (!) 97 F (36.1 C)   TempSrc: Oral   SpO2: 100%   Weight: 129 lb (58.5 kg)   Height: 5\' 6"  (1.676 m)     Body mass index is 20.82 kg/m.  General:  Alert and oriented, no acute distress HEENT: Normal Neck: No bruit or JVD Pulmonary: Clear to auscultation bilaterally Cardiac: Regular Rate and Rhythm without murmur Gastrointestinal: Soft, non-tender, non-distended, no mass, no scars Skin: No rash Extremity Pulses:  2+ radial, brachial pulses bilaterally Musculoskeletal: Lower extremity  edema  Neurologic: Upper and lower extremity motor 5/5 and symmetric  DATA:  Vein mapping and arterial UE duplex 01/27/2018 She has very small veins in B UE Her arterial flow is triphasic with sufficient size B. Brachials > 0.43 cm   ASSESSMENT:  CKD stage IV/V   PLAN: We reviewed her vein mapping and discovered she has insufficient vein size for fistula creation.  We recommend creation of left AV graft for HD access.  We will plan surgery in the near future when Dr. has advised she is within 4 weeks of needing HD.    PA-C [facility]  The patient was seen in conjunction with Dr. 01/29/18 today  History and exam findings as above.  Patient has fairly small and adequate veins for creation of a fistula.  I believe the best option for her would be an upper arm graft  on the left side when she approaches the eminent dialysis.  We will await word from Dr. 01/29/18 on scheduling.  The patient will otherwise follow-up on an as-needed basis.  Risk benefits possible complications and procedure details were explained to the patient today including but not limited to bleeding infection ischemic steal graft thrombosis need for other procedures.  She understands and agrees to proceed if she requires it.   Fields, MD [facility] [place] Office: [phone] Pager: [phone]

## 2018-01-28 DIAGNOSIS — N189 Chronic kidney disease, unspecified: Secondary | ICD-10-CM | POA: Diagnosis not present

## 2018-01-28 DIAGNOSIS — D631 Anemia in chronic kidney disease: Secondary | ICD-10-CM | POA: Diagnosis not present

## 2018-02-02 ENCOUNTER — Encounter: Payer: Self-pay | Admitting: *Deleted

## 2018-02-03 ENCOUNTER — Other Ambulatory Visit: Payer: Self-pay | Admitting: *Deleted

## 2018-02-03 DIAGNOSIS — M1180 Other specified crystal arthropathies, unspecified site: Secondary | ICD-10-CM | POA: Diagnosis not present

## 2018-02-03 DIAGNOSIS — M1A09X0 Idiopathic chronic gout, multiple sites, without tophus (tophi): Secondary | ICD-10-CM | POA: Diagnosis not present

## 2018-02-03 DIAGNOSIS — Z7952 Long term (current) use of systemic steroids: Secondary | ICD-10-CM | POA: Diagnosis not present

## 2018-02-03 DIAGNOSIS — M0589 Other rheumatoid arthritis with rheumatoid factor of multiple sites: Secondary | ICD-10-CM | POA: Diagnosis not present

## 2018-02-03 DIAGNOSIS — M150 Primary generalized (osteo)arthritis: Secondary | ICD-10-CM | POA: Diagnosis not present

## 2018-02-11 ENCOUNTER — Other Ambulatory Visit: Payer: Self-pay

## 2018-02-11 ENCOUNTER — Other Ambulatory Visit: Payer: Self-pay | Admitting: *Deleted

## 2018-02-11 ENCOUNTER — Encounter (HOSPITAL_COMMUNITY): Payer: Self-pay | Admitting: *Deleted

## 2018-02-11 NOTE — Progress Notes (Signed)
Spoke with pt for pre-op call. Pt states she has "fluid around her heart", note from cardiologist in Care Everywhere wrote Pericardial effusion. Pt denies chest pain or sob. Pt states she is not diabetic.

## 2018-02-14 ENCOUNTER — Encounter (HOSPITAL_COMMUNITY): Payer: Self-pay | Admitting: *Deleted

## 2018-02-14 ENCOUNTER — Ambulatory Visit (HOSPITAL_COMMUNITY): Payer: Medicare Other | Admitting: Certified Registered"

## 2018-02-14 ENCOUNTER — Ambulatory Visit (HOSPITAL_COMMUNITY)
Admission: RE | Admit: 2018-02-14 | Discharge: 2018-02-14 | Disposition: A | Discharge status: Home / Self Care | Payer: Medicare Other | Source: Ambulatory Visit | Attending: Vascular Surgery | Admitting: Vascular Surgery

## 2018-02-14 ENCOUNTER — Encounter (HOSPITAL_COMMUNITY)
Admission: RE | Disposition: A | Discharge status: Home / Self Care | Payer: Self-pay | Source: Ambulatory Visit | Attending: Vascular Surgery

## 2018-02-14 DIAGNOSIS — M109 Gout, unspecified: Secondary | ICD-10-CM | POA: Insufficient documentation

## 2018-02-14 DIAGNOSIS — D631 Anemia in chronic kidney disease: Secondary | ICD-10-CM | POA: Diagnosis not present

## 2018-02-14 DIAGNOSIS — N184 Chronic kidney disease, stage 4 (severe): Secondary | ICD-10-CM | POA: Diagnosis not present

## 2018-02-14 DIAGNOSIS — M069 Rheumatoid arthritis, unspecified: Secondary | ICD-10-CM | POA: Diagnosis not present

## 2018-02-14 DIAGNOSIS — I129 Hypertensive chronic kidney disease with stage 1 through stage 4 chronic kidney disease, or unspecified chronic kidney disease: Secondary | ICD-10-CM | POA: Diagnosis not present

## 2018-02-14 DIAGNOSIS — M1990 Unspecified osteoarthritis, unspecified site: Secondary | ICD-10-CM | POA: Insufficient documentation

## 2018-02-14 DIAGNOSIS — R739 Hyperglycemia, unspecified: Secondary | ICD-10-CM | POA: Diagnosis not present

## 2018-02-14 DIAGNOSIS — Z8249 Family history of ischemic heart disease and other diseases of the circulatory system: Secondary | ICD-10-CM | POA: Insufficient documentation

## 2018-02-14 HISTORY — DX: Personal history of pulmonary embolism: Z86.711

## 2018-02-14 HISTORY — DX: Gastro-esophageal reflux disease without esophagitis: K21.9

## 2018-02-14 HISTORY — DX: Hypothyroidism, unspecified: E03.9

## 2018-02-14 HISTORY — DX: Pericardial effusion (noninflammatory): I31.3

## 2018-02-14 HISTORY — PX: AV FISTULA PLACEMENT: SHX1204

## 2018-02-14 HISTORY — DX: Other pericardial effusion (noninflammatory): I31.39

## 2018-02-14 LAB — POCT I-STAT 4, (NA,K, GLUC, HGB,HCT)
GLUCOSE: 87 mg/dL (ref 65–99)
HEMATOCRIT: 28 % — AB (ref 36.0–46.0)
Hemoglobin: 9.5 g/dL — ABNORMAL LOW (ref 12.0–15.0)
Potassium: 4.5 mmol/L (ref 3.5–5.1)
Sodium: 139 mmol/L (ref 135–145)

## 2018-02-14 SURGERY — INSERTION OF ARTERIOVENOUS (AV) GORE-TEX GRAFT ARM
Anesthesia: Monitor Anesthesia Care | Site: Arm Upper | Laterality: Left

## 2018-02-14 MED ORDER — HEPARIN SODIUM (PORCINE) 1000 UNIT/ML IJ SOLN
INTRAMUSCULAR | Status: DC | PRN
  Administered 2018-02-14: 3000 [IU] via INTRAVENOUS

## 2018-02-14 MED ORDER — LIDOCAINE HCL (PF) 1 % IJ SOLN
INTRAMUSCULAR | Status: AC
  Filled 2018-02-14: qty 30

## 2018-02-14 MED ORDER — PROPOFOL 500 MG/50ML IV EMUL
INTRAVENOUS | Status: DC | PRN
  Administered 2018-02-14: 75 ug/kg/min via INTRAVENOUS

## 2018-02-14 MED ORDER — PROTAMINE SULFATE 10 MG/ML IV SOLN
INTRAVENOUS | Status: AC
  Filled 2018-02-14: qty 10

## 2018-02-14 MED ORDER — PHENYLEPHRINE 40 MCG/ML (10ML) SYRINGE FOR IV PUSH (FOR BLOOD PRESSURE SUPPORT)
PREFILLED_SYRINGE | INTRAVENOUS | Status: DC | PRN
  Administered 2018-02-14: 160 ug via INTRAVENOUS

## 2018-02-14 MED ORDER — MEPERIDINE HCL 50 MG/ML IJ SOLN: 6.2500 mg | INTRAMUSCULAR | Status: DC | PRN

## 2018-02-14 MED ORDER — CEFAZOLIN SODIUM-DEXTROSE 2-4 GM/100ML-% IV SOLN
2.0000 g | INTRAVENOUS | Status: AC
  Administered 2018-02-14: 2 g via INTRAVENOUS

## 2018-02-14 MED ORDER — SODIUM CHLORIDE 0.9 % IV SOLN
INTRAVENOUS | Status: DC | PRN
  Administered 2018-02-14: 12:00:00

## 2018-02-14 MED ORDER — FENTANYL CITRATE (PF) 250 MCG/5ML IJ SOLN
INTRAMUSCULAR | Status: DC | PRN
  Administered 2018-02-14 (×2): 25 ug via INTRAVENOUS
  Administered 2018-02-14: 50 ug via INTRAVENOUS

## 2018-02-14 MED ORDER — EPHEDRINE 5 MG/ML INJ
INTRAVENOUS | Status: AC
  Filled 2018-02-14: qty 10

## 2018-02-14 MED ORDER — CEFAZOLIN SODIUM 1 G IJ SOLR
INTRAMUSCULAR | Status: AC
  Filled 2018-02-14: qty 20

## 2018-02-14 MED ORDER — PROPOFOL 1000 MG/100ML IV EMUL
INTRAVENOUS | Status: AC
  Filled 2018-02-14: qty 100

## 2018-02-14 MED ORDER — PROPOFOL 10 MG/ML IV BOLUS
INTRAVENOUS | Status: AC
  Filled 2018-02-14: qty 20

## 2018-02-14 MED ORDER — CHLORHEXIDINE GLUCONATE 4 % EX LIQD: 60.0000 mL | Freq: Once | CUTANEOUS | Status: DC

## 2018-02-14 MED ORDER — PHENYLEPHRINE HCL 10 MG/ML IJ SOLN
INTRAVENOUS | Status: DC | PRN
  Administered 2018-02-14: 25 ug/min via INTRAVENOUS

## 2018-02-14 MED ORDER — FENTANYL CITRATE (PF) 100 MCG/2ML IJ SOLN: 25.0000 ug | INTRAMUSCULAR | Status: DC | PRN

## 2018-02-14 MED ORDER — LIDOCAINE HCL (CARDIAC) 20 MG/ML IV SOLN
INTRAVENOUS | Status: AC
  Filled 2018-02-14: qty 10

## 2018-02-14 MED ORDER — PROTAMINE SULFATE 10 MG/ML IV SOLN
INTRAVENOUS | Status: DC | PRN
  Administered 2018-02-14: 30 mg via INTRAVENOUS

## 2018-02-14 MED ORDER — ONDANSETRON HCL 4 MG/2ML IJ SOLN
INTRAMUSCULAR | Status: DC | PRN
  Administered 2018-02-14: 4 mg via INTRAVENOUS

## 2018-02-14 MED ORDER — PROMETHAZINE HCL 25 MG/ML IJ SOLN: 6.2500 mg | INTRAMUSCULAR | Status: DC | PRN

## 2018-02-14 MED ORDER — MIDAZOLAM HCL 2 MG/2ML IJ SOLN: 0.5000 mg | Freq: Once | INTRAMUSCULAR | Status: DC | PRN

## 2018-02-14 MED ORDER — 0.9 % SODIUM CHLORIDE (POUR BTL) OPTIME
TOPICAL | Status: DC | PRN
  Administered 2018-02-14: 1000 mL

## 2018-02-14 MED ORDER — PROTAMINE SULFATE 10 MG/ML IV SOLN
INTRAVENOUS | Status: AC
  Filled 2018-02-14: qty 5

## 2018-02-14 MED ORDER — FENTANYL CITRATE (PF) 250 MCG/5ML IJ SOLN
INTRAMUSCULAR | Status: AC
Start: 2018-02-14 — End: 2018-02-14
  Filled 2018-02-14: qty 5

## 2018-02-14 MED ORDER — PROPOFOL 10 MG/ML IV BOLUS
INTRAVENOUS | Status: DC | PRN
  Administered 2018-02-14 (×2): 30 mg via INTRAVENOUS

## 2018-02-14 MED ORDER — ONDANSETRON HCL 4 MG/2ML IJ SOLN
INTRAMUSCULAR | Status: AC
  Filled 2018-02-14: qty 2

## 2018-02-14 MED ORDER — TRAMADOL HCL 50 MG PO TABS: 50.0000 mg | ORAL_TABLET | Freq: Four times a day (QID) | ORAL | 0 refills | Status: DC | PRN

## 2018-02-14 MED ORDER — LIDOCAINE HCL (PF) 1 % IJ SOLN
INTRAMUSCULAR | Status: DC | PRN
  Administered 2018-02-14: 18 mL

## 2018-02-14 MED ORDER — LIDOCAINE 2% (20 MG/ML) 5 ML SYRINGE
INTRAMUSCULAR | Status: DC | PRN
  Administered 2018-02-14: 20 mg via INTRAVENOUS
  Administered 2018-02-14: 40 mg via INTRAVENOUS
  Administered 2018-02-14 (×2): 20 mg via INTRAVENOUS

## 2018-02-14 MED ORDER — SODIUM CHLORIDE 0.9 % IV SOLN
INTRAVENOUS | Status: DC
  Administered 2018-02-14 (×2): via INTRAVENOUS

## 2018-02-14 SURGICAL SUPPLY — 34 items
ARMBAND PINK RESTRICT EXTREMIT (MISCELLANEOUS) ×4 IMPLANT
CANISTER SUCT 3000ML PPV (MISCELLANEOUS) ×2 IMPLANT
CANNULA VESSEL 3MM 2 BLNT TIP (CANNULA) ×2 IMPLANT
CLIP VESOCCLUDE MED 6/CT (CLIP) ×2 IMPLANT
CLIP VESOCCLUDE SM WIDE 6/CT (CLIP) ×2 IMPLANT
DECANTER SPIKE VIAL GLASS SM (MISCELLANEOUS) ×2 IMPLANT
DERMABOND ADHESIVE PROPEN (GAUZE/BANDAGES/DRESSINGS) ×1
DERMABOND ADVANCED (GAUZE/BANDAGES/DRESSINGS) ×1
DERMABOND ADVANCED .7 DNX12 (GAUZE/BANDAGES/DRESSINGS) ×1 IMPLANT
DERMABOND ADVANCED .7 DNX6 (GAUZE/BANDAGES/DRESSINGS) ×1 IMPLANT
ELECT REM PT RETURN 9FT ADLT (ELECTROSURGICAL) ×2
ELECTRODE REM PT RTRN 9FT ADLT (ELECTROSURGICAL) ×1 IMPLANT
GLOVE BIO SURGEON STRL SZ7.5 (GLOVE) ×2 IMPLANT
GLOVE BIOGEL PI IND STRL 6.5 (GLOVE) ×1 IMPLANT
GLOVE BIOGEL PI INDICATOR 6.5 (GLOVE) ×1
GLOVE SURG SS PI 6.5 STRL IVOR (GLOVE) ×2 IMPLANT
GOWN STRL NON-REIN LRG LVL3 (GOWN DISPOSABLE) ×2 IMPLANT
GOWN STRL REUS W/ TWL LRG LVL3 (GOWN DISPOSABLE) ×3 IMPLANT
GOWN STRL REUS W/TWL LRG LVL3 (GOWN DISPOSABLE) ×3
GRAFT GORETEX STRT 4-7X45 (Vascular Products) ×2 IMPLANT
HEMOSTAT SPONGE AVITENE ULTRA (HEMOSTASIS) IMPLANT
KIT BASIN OR (CUSTOM PROCEDURE TRAY) ×2 IMPLANT
KIT ROOM TURNOVER OR (KITS) ×2 IMPLANT
NS IRRIG 1000ML POUR BTL (IV SOLUTION) ×2 IMPLANT
PACK CV ACCESS (CUSTOM PROCEDURE TRAY) ×2 IMPLANT
PAD ARMBOARD 7.5X6 YLW CONV (MISCELLANEOUS) ×4 IMPLANT
SUT PROLENE 6 0 CC (SUTURE) ×8 IMPLANT
SUT VIC AB 3-0 SH 27 (SUTURE) ×3
SUT VIC AB 3-0 SH 27X BRD (SUTURE) ×3 IMPLANT
SUT VICRYL 4-0 PS2 18IN ABS (SUTURE) ×4 IMPLANT
SYR TOOMEY 50ML (SYRINGE) IMPLANT
TOWEL GREEN STERILE (TOWEL DISPOSABLE) ×2 IMPLANT
UNDERPAD 30X30 (UNDERPADS AND DIAPERS) ×2 IMPLANT
WATER STERILE IRR 1000ML POUR (IV SOLUTION) ×2 IMPLANT

## 2018-02-14 NOTE — Op Note (Signed)
Procedure: Left Upper Arm AV graft  Preop: CKD 4  Postop: CKD 4  Anesthesia: Local with sedation  Asst: Matt Eveland PA-C  Findings:4-7 mm PTFE end to side to axillary vein   Procedure Details: The left upper extremity was prepped and draped in usual sterile fashion.  Local anesthesia was infiltrated over the brachial artery near the antecubital crease.  A longitudinal incision was then made near the antecubital crease the left arm.  There were no suitable antecubital veins for outflow.   The incision was carried into the subcutaneous tissues down to level of the brachial artery.  Next the brachial artery was dissected free in the medial portion incision. The artery was  3 mm in diameter. The vessel loops were placed proximal and distal to the planned site of arteriotomy. At this point, a longitudinal incision was made in the axilla and carried through the subcutaneous tissues and fascia to expose the axillary vein.  The nerves were protected.  The vein was approximately 4 mm in diameter. Next, a subcutaneous tunnel was created connecting the upper arm to the lower arm incision in an arcing configuration over the biceps muscle.  A 4-7 mm PTFE graft was then brought through this subcutaneous tunnel. The patient was given 3000 units of intravenous heparin. After appropriate circulation time, the vessel loops were used to control the artery. A longitudinal opening was made in the right brachial artery.  The 4 mm end of the graft was beveled and sewn end to side to the artery using a 6 0 prolene.  At completion of the anastomosis the artery was forward bled, backbled and thoroughly flushed.  The anastomosis was secured, vessel loops were released and there was palpable pulse in the graft.  The graft was clamped just above the arterial anastomosis with a fistula clamp. The graft was then pulled taut to length at the axillary incision.  The axillary vein was controlled with a fine bulldog clamp in the upper  axilla proximally and distally.  The vein was opened longitudinally.  The distal end of the graft was then beveled and sewn end to side to the vein using a running 6 0 prolene.  Just prior to completion of the anastomosis, everything was forward bled, back bled and thoroughly flushed.  The anastomosis was secured and the fistula clamp removed from the proximal graft.  A thrill was immediately palpable in the graft. The patient was given 30 mg of protamine to assist with hemostasis.  After hemostasis was obtained, the subcutaneous tissues were reapproximated using a running 3-0 Vicryl suture. The skin was then closed with a 4 0 Vicryl subcuticular stitch. Dermabond was applied to the skin incisions.  The patient tolerated the procedure well and there were no complications.  Instrument sponge and needle count was correct at the end of the case.  The patient was taken to the recovery room in stable condition. The patient had a palpable radial pulse at the end of the case.  Fabienne Bruns, MD [facility] Office: [phone] Pager: [phone] Review of the rales spoke

## 2018-02-14 NOTE — Anesthesia Postprocedure Evaluation (Signed)
Anesthesia Post Note  Patient: Rachel Johns  Procedure(s) Performed: INSERTION OF ARTERIOVENOUS (AV) GORE-TEX GRAFT ARM (Left Arm Upper)     Patient location during evaluation: PACU Anesthesia Type: MAC Level of consciousness: awake and alert, patient cooperative and oriented Pain management: pain level controlled Vital Signs Assessment: post-procedure vital signs reviewed and stable Respiratory status: spontaneous breathing, nonlabored ventilation and respiratory function stable Cardiovascular status: blood pressure returned to baseline and stable Postop Assessment: no apparent nausea or vomiting Anesthetic complications: no    Last Vitals:  Vitals:   02/14/18 1344 02/14/18 1345  BP: (!) 151/87   Pulse: 84 85  Resp: 12 15  Temp:  (!) 36.2 C  SpO2: 97% 97%    Last Pain:  Vitals:   02/14/18 0911  TempSrc:   PainSc: 5                  Rosielee Corporan,E. Aceson Labell

## 2018-02-14 NOTE — Anesthesia Preprocedure Evaluation (Addendum)
Anesthesia Evaluation  Patient identified by MRN, date of birth, ID band Patient awake    Reviewed: Allergy & Precautions, NPO status , Patient's Chart, lab work & pertinent test results  History of Anesthesia Complications Negative for: history of anesthetic complications  Airway Mallampati: II  TM Distance: >3 FB Neck ROM: Full    Dental  (+) Dental Advisory Given   Pulmonary neg pulmonary ROS,    breath sounds clear to auscultation       Cardiovascular hypertension, Pt. on medications (-) angina Rhythm:Regular Rate:Normal  '18 ECHO: EF 55% to 60%. grade 1 diastolic dysfunction.  mild MR, pericardial effusion   Neuro/Psych negative neurological ROS     GI/Hepatic Neg liver ROS, GERD  Medicated and Controlled,  Endo/Other  Hypothyroidism   Renal/GU ESRFRenal disease (no dialysis yet, K+ 4.5)     Musculoskeletal  (+) Arthritis , Rheumatoid disorders,    Abdominal   Peds  Hematology   Anesthesia Other Findings   Reproductive/Obstetrics                            Anesthesia Physical Anesthesia Plan  ASA: III  Anesthesia Plan: MAC   Post-op Pain Management:    Induction:   PONV Risk Score and Plan: 2 and Ondansetron and Treatment may vary due to age or medical condition  Airway Management Planned: Natural Airway and Simple Face Mask  Additional Equipment:   Intra-op Plan:   Post-operative Plan:   Informed Consent: I have reviewed the patients History and Physical, chart, labs and discussed the procedure including the risks, benefits and alternatives for the proposed anesthesia with the patient or authorized representative who has indicated his/her understanding and acceptance.   Dental advisory given  Plan Discussed with: CRNA and Surgeon  Anesthesia Plan Comments: (Plan routine monitors, MAC)        Anesthesia Quick Evaluation

## 2018-02-14 NOTE — Discharge Instructions (Signed)
° °  [facility] ° °Discharge Instructions ° °AV Fistula or Graft Surgery for Dialysis Access ° °Please refer to the following instructions for your post-procedure care. Your surgeon or physician assistant will discuss any changes with you. ° °Activity ° °You may drive the day following your surgery, if you are comfortable and no longer taking prescription pain medication. Resume full activity as the soreness in your incision resolves. ° °Bathing/Showering ° °You may shower after you go home. Keep your incision dry for 48 hours. Do not soak in a bathtub, hot tub, or swim until the incision heals completely. You may not shower if you have a hemodialysis catheter. ° °Incision Care ° °Clean your incision with mild soap and water after 48 hours. Pat the area dry with a clean towel. You do not need a bandage unless otherwise instructed. Do not apply any ointments or creams to your incision. You may have skin glue on your incision. Do not peel it off. It will come off on its own in about one week. Your arm may swell a bit after surgery. To reduce swelling use pillows to elevate your arm so it is above your heart. Your doctor will tell you if you need to lightly wrap your arm with an ACE bandage. ° °Diet ° °Resume your normal diet. There are not special food restrictions following this procedure. In order to heal from your surgery, it is CRITICAL to get adequate nutrition. Your body requires vitamins, minerals, and protein. Vegetables are the best source of vitamins and minerals. Vegetables also provide the perfect balance of protein. Processed food has little nutritional value, so try to avoid this. ° °Medications ° °Resume taking all of your medications. If your incision is causing pain, you may take over-the counter pain relievers such as acetaminophen (Tylenol). If you were prescribed a stronger pain medication, please be aware these medications can cause nausea and constipation. Prevent  nausea by taking the medication with a snack or meal. Avoid constipation by drinking plenty of fluids and eating foods with high amount of fiber, such as fruits, vegetables, and grains. Do not take Tylenol if you are taking prescription pain medications. ° ° ° ° °Follow up °Your surgeon may want to see you in the office following your access surgery. If so, this will be arranged at the time of your surgery. ° °Please call us immediately for any of the following conditions: ° °Increased pain, redness, drainage (pus) from your incision site °Fever of 101 degrees or higher °Severe or worsening pain at your incision site °Hand pain or numbness. ° °Reduce your risk of vascular disease: ° °Stop smoking. If you would like help, call [facility] at 1-800-QUIT-NOW (1-800-784-8669) or [facility] at 336-586-4000 ° °Manage your cholesterol °Maintain a desired weight °Control your diabetes °Keep your blood pressure down ° °Dialysis ° °It will take several weeks to several months for your new dialysis access to be ready for use. Your surgeon will determine when it is OK to use it. Your nephrologist will continue to direct your dialysis. You can continue to use your Permcath until your new access is ready for use. ° °If you have any questions, please call the office at [phone]. ° °

## 2018-02-14 NOTE — Interval H&P Note (Signed)
History and Physical Interval Note:  02/14/2018 10:24 AM  Rachel Johns  has presented today for surgery, with the diagnosis of Chronic kidney disease  The various methods of treatment have been discussed with the patient and family. After consideration of risks, benefits and other options for treatment, the patient has consented to  Procedure(s): INSERTION OF ARTERIOVENOUS (AV) GORE-TEX GRAFT ARM (Left) as a surgical intervention .  The patient's history has been reviewed, patient examined, no change in status, stable for surgery.  I have reviewed the patient's chart and labs.  Questions were answered to the patient's satisfaction.     Fabienne Bruns

## 2018-02-14 NOTE — Transfer of Care (Signed)
Immediate Anesthesia Transfer of Care Note  Patient: Rachel Johns  Procedure(s) Performed: INSERTION OF ARTERIOVENOUS (AV) GORE-TEX GRAFT ARM (Left Arm Upper)  Patient Location: PACU  Anesthesia Type:MAC  Level of Consciousness: drowsy and patient cooperative  Airway & Oxygen Therapy: Patient Spontanous Breathing and Patient connected to face mask oxygen  Post-op Assessment: Report given to RN, Post -op Vital signs reviewed and stable and Patient moving all extremities X 4  Post vital signs: Reviewed and stable  Last Vitals:  Vitals:   02/14/18 0841  BP: (!) 187/66  Pulse: 77  Resp: 17  Temp: 36.5 C  SpO2: 100%    Last Pain:  Vitals:   02/14/18 0911  TempSrc:   PainSc: 5       Patients Stated Pain Goal: 4 (16/94/50 3888)  Complications: No apparent anesthesia complications

## 2018-02-14 NOTE — Interval H&P Note (Signed)
History and Physical Interval Note:  02/14/2018 10:11 AM  Rachel Johns  has presented today for surgery, with the diagnosis of Chronic kidney disease  The various methods of treatment have been discussed with the patient and family. After consideration of risks, benefits and other options for treatment, the patient has consented to  Procedure(s): INSERTION OF ARTERIOVENOUS (AV) GORE-TEX GRAFT ARM (Left) as a surgical intervention .  The patient's history has been reviewed, patient examined, no change in status, stable for surgery.  I have reviewed the patient's chart and labs.  Questions were answered to the patient's satisfaction.     Fabienne Bruns

## 2018-02-15 ENCOUNTER — Encounter (HOSPITAL_COMMUNITY): Payer: Self-pay | Admitting: Vascular Surgery

## 2018-02-16 ENCOUNTER — Telehealth: Payer: Self-pay | Admitting: Vascular Surgery

## 2018-02-16 NOTE — Telephone Encounter (Signed)
Spoke to pt and confirmed 03/10/18 appt. Mailed letter

## 2018-02-16 NOTE — Telephone Encounter (Signed)
-----   Message from Sharee Pimple, RN sent at 02/14/2018 12:34 PM EDT ----- Regarding: 2-3 weeks w/ Fields   ----- Message ----- From: Forestine Na Sent: 02/14/2018  12:24 PM To: [facility] Charge Pool  Can you schedule an appt for this pt in about 2 weeks with Dr. Darrick Penna.  PO L arm AV graft. Thanks, [employer]

## 2018-02-16 NOTE — Telephone Encounter (Signed)
Spoke to pt to confirm appt. Mailed letter

## 2018-02-16 NOTE — Telephone Encounter (Signed)
-----   Message from Bayard Hugger, LPN sent at [phone]/67/8938  9:28 AM EDT ----- Regarding: Post op apt Pt states to please make her post op apt around 10 am, as she has trouble driving in traffic.   Revonda Standard

## 2018-02-23 ENCOUNTER — Encounter: Payer: Medicare Other | Admitting: Vascular Surgery

## 2018-02-23 ENCOUNTER — Encounter (HOSPITAL_COMMUNITY): Payer: Medicare Other

## 2018-02-23 ENCOUNTER — Other Ambulatory Visit (HOSPITAL_COMMUNITY): Payer: Medicare Other

## 2018-02-25 DIAGNOSIS — N189 Chronic kidney disease, unspecified: Secondary | ICD-10-CM | POA: Diagnosis not present

## 2018-02-25 DIAGNOSIS — D631 Anemia in chronic kidney disease: Secondary | ICD-10-CM | POA: Diagnosis not present

## 2018-02-28 DIAGNOSIS — N028 Recurrent and persistent hematuria with other morphologic changes: Secondary | ICD-10-CM | POA: Diagnosis not present

## 2018-02-28 DIAGNOSIS — J189 Pneumonia, unspecified organism: Secondary | ICD-10-CM

## 2018-02-28 DIAGNOSIS — Z992 Dependence on renal dialysis: Secondary | ICD-10-CM | POA: Diagnosis not present

## 2018-02-28 DIAGNOSIS — N186 End stage renal disease: Secondary | ICD-10-CM | POA: Diagnosis not present

## 2018-02-28 HISTORY — DX: Pneumonia, unspecified organism: J18.9

## 2018-03-02 DIAGNOSIS — I110 Hypertensive heart disease with heart failure: Secondary | ICD-10-CM | POA: Diagnosis not present

## 2018-03-02 DIAGNOSIS — E875 Hyperkalemia: Secondary | ICD-10-CM | POA: Diagnosis not present

## 2018-03-02 DIAGNOSIS — R0602 Shortness of breath: Secondary | ICD-10-CM | POA: Diagnosis not present

## 2018-03-02 DIAGNOSIS — E039 Hypothyroidism, unspecified: Secondary | ICD-10-CM | POA: Diagnosis not present

## 2018-03-02 DIAGNOSIS — R079 Chest pain, unspecified: Secondary | ICD-10-CM | POA: Diagnosis not present

## 2018-03-02 DIAGNOSIS — I509 Heart failure, unspecified: Secondary | ICD-10-CM | POA: Diagnosis not present

## 2018-03-02 DIAGNOSIS — R000 Tachycardia, unspecified: Secondary | ICD-10-CM | POA: Diagnosis not present

## 2018-03-02 DIAGNOSIS — J90 Pleural effusion, not elsewhere classified: Secondary | ICD-10-CM | POA: Diagnosis not present

## 2018-03-02 DIAGNOSIS — D641 Secondary sideroblastic anemia due to disease: Secondary | ICD-10-CM | POA: Diagnosis not present

## 2018-03-02 DIAGNOSIS — J189 Pneumonia, unspecified organism: Secondary | ICD-10-CM | POA: Diagnosis not present

## 2018-03-02 DIAGNOSIS — K449 Diaphragmatic hernia without obstruction or gangrene: Secondary | ICD-10-CM | POA: Diagnosis not present

## 2018-03-02 DIAGNOSIS — I130 Hypertensive heart and chronic kidney disease with heart failure and stage 1 through stage 4 chronic kidney disease, or unspecified chronic kidney disease: Secondary | ICD-10-CM | POA: Diagnosis not present

## 2018-03-02 DIAGNOSIS — J449 Chronic obstructive pulmonary disease, unspecified: Secondary | ICD-10-CM | POA: Diagnosis not present

## 2018-03-02 DIAGNOSIS — N179 Acute kidney failure, unspecified: Secondary | ICD-10-CM | POA: Diagnosis not present

## 2018-03-02 DIAGNOSIS — D649 Anemia, unspecified: Secondary | ICD-10-CM | POA: Diagnosis not present

## 2018-03-02 DIAGNOSIS — M069 Rheumatoid arthritis, unspecified: Secondary | ICD-10-CM | POA: Diagnosis not present

## 2018-03-02 DIAGNOSIS — Z86711 Personal history of pulmonary embolism: Secondary | ICD-10-CM | POA: Diagnosis not present

## 2018-03-02 DIAGNOSIS — I480 Paroxysmal atrial fibrillation: Secondary | ICD-10-CM | POA: Diagnosis not present

## 2018-03-02 DIAGNOSIS — N189 Chronic kidney disease, unspecified: Secondary | ICD-10-CM | POA: Diagnosis not present

## 2018-03-02 DIAGNOSIS — I4891 Unspecified atrial fibrillation: Secondary | ICD-10-CM | POA: Diagnosis not present

## 2018-03-02 DIAGNOSIS — J439 Emphysema, unspecified: Secondary | ICD-10-CM | POA: Diagnosis not present

## 2018-03-02 DIAGNOSIS — E871 Hypo-osmolality and hyponatremia: Secondary | ICD-10-CM | POA: Diagnosis not present

## 2018-03-02 DIAGNOSIS — I081 Rheumatic disorders of both mitral and tricuspid valves: Secondary | ICD-10-CM | POA: Diagnosis not present

## 2018-03-02 DIAGNOSIS — K5730 Diverticulosis of large intestine without perforation or abscess without bleeding: Secondary | ICD-10-CM | POA: Diagnosis not present

## 2018-03-02 DIAGNOSIS — I5043 Acute on chronic combined systolic (congestive) and diastolic (congestive) heart failure: Secondary | ICD-10-CM | POA: Diagnosis not present

## 2018-03-02 DIAGNOSIS — Q211 Atrial septal defect: Secondary | ICD-10-CM | POA: Diagnosis not present

## 2018-03-02 DIAGNOSIS — M109 Gout, unspecified: Secondary | ICD-10-CM | POA: Diagnosis not present

## 2018-03-02 DIAGNOSIS — I10 Essential (primary) hypertension: Secondary | ICD-10-CM | POA: Diagnosis not present

## 2018-03-02 DIAGNOSIS — N184 Chronic kidney disease, stage 4 (severe): Secondary | ICD-10-CM | POA: Diagnosis not present

## 2018-03-03 DIAGNOSIS — I509 Heart failure, unspecified: Secondary | ICD-10-CM

## 2018-03-03 DIAGNOSIS — N189 Chronic kidney disease, unspecified: Secondary | ICD-10-CM

## 2018-03-04 DIAGNOSIS — I4891 Unspecified atrial fibrillation: Secondary | ICD-10-CM | POA: Diagnosis not present

## 2018-03-04 DIAGNOSIS — I5043 Acute on chronic combined systolic (congestive) and diastolic (congestive) heart failure: Secondary | ICD-10-CM | POA: Diagnosis not present

## 2018-03-05 DIAGNOSIS — I4891 Unspecified atrial fibrillation: Secondary | ICD-10-CM | POA: Diagnosis not present

## 2018-03-05 DIAGNOSIS — I5043 Acute on chronic combined systolic (congestive) and diastolic (congestive) heart failure: Secondary | ICD-10-CM | POA: Diagnosis not present

## 2018-03-07 DIAGNOSIS — I5043 Acute on chronic combined systolic (congestive) and diastolic (congestive) heart failure: Secondary | ICD-10-CM | POA: Diagnosis not present

## 2018-03-07 DIAGNOSIS — I4891 Unspecified atrial fibrillation: Secondary | ICD-10-CM | POA: Diagnosis not present

## 2018-03-09 ENCOUNTER — Inpatient Hospital Stay (HOSPITAL_COMMUNITY)
Admission: AD | Admit: 2018-03-09 | Discharge: 2018-03-25 | DRG: 682 | Disposition: A | Discharge status: Skilled Nursing Facility | Payer: Medicare Other | Source: Other Acute Inpatient Hospital | Attending: Internal Medicine | Admitting: Internal Medicine

## 2018-03-09 ENCOUNTER — Other Ambulatory Visit: Payer: Self-pay

## 2018-03-09 ENCOUNTER — Encounter (HOSPITAL_COMMUNITY): Payer: Self-pay | Admitting: General Practice

## 2018-03-09 DIAGNOSIS — L8990 Pressure ulcer of unspecified site, unspecified stage: Secondary | ICD-10-CM

## 2018-03-09 DIAGNOSIS — D72829 Elevated white blood cell count, unspecified: Secondary | ICD-10-CM | POA: Diagnosis present

## 2018-03-09 DIAGNOSIS — L89899 Pressure ulcer of other site, unspecified stage: Secondary | ICD-10-CM | POA: Diagnosis not present

## 2018-03-09 DIAGNOSIS — M069 Rheumatoid arthritis, unspecified: Secondary | ICD-10-CM | POA: Diagnosis present

## 2018-03-09 DIAGNOSIS — D631 Anemia in chronic kidney disease: Secondary | ICD-10-CM | POA: Diagnosis not present

## 2018-03-09 DIAGNOSIS — Z992 Dependence on renal dialysis: Secondary | ICD-10-CM | POA: Diagnosis not present

## 2018-03-09 DIAGNOSIS — Z96643 Presence of artificial hip joint, bilateral: Secondary | ICD-10-CM | POA: Diagnosis present

## 2018-03-09 DIAGNOSIS — I509 Heart failure, unspecified: Secondary | ICD-10-CM | POA: Diagnosis not present

## 2018-03-09 DIAGNOSIS — I5043 Acute on chronic combined systolic (congestive) and diastolic (congestive) heart failure: Secondary | ICD-10-CM | POA: Diagnosis present

## 2018-03-09 DIAGNOSIS — E8779 Other fluid overload: Secondary | ICD-10-CM | POA: Diagnosis not present

## 2018-03-09 DIAGNOSIS — Z7952 Long term (current) use of systemic steroids: Secondary | ICD-10-CM | POA: Diagnosis not present

## 2018-03-09 DIAGNOSIS — N189 Chronic kidney disease, unspecified: Secondary | ICD-10-CM | POA: Diagnosis not present

## 2018-03-09 DIAGNOSIS — Z6824 Body mass index (BMI) 24.0-24.9, adult: Secondary | ICD-10-CM

## 2018-03-09 DIAGNOSIS — Z833 Family history of diabetes mellitus: Secondary | ICD-10-CM

## 2018-03-09 DIAGNOSIS — M6281 Muscle weakness (generalized): Secondary | ICD-10-CM | POA: Diagnosis not present

## 2018-03-09 DIAGNOSIS — I5041 Acute combined systolic (congestive) and diastolic (congestive) heart failure: Secondary | ICD-10-CM | POA: Insufficient documentation

## 2018-03-09 DIAGNOSIS — Z90710 Acquired absence of both cervix and uterus: Secondary | ICD-10-CM

## 2018-03-09 DIAGNOSIS — J449 Chronic obstructive pulmonary disease, unspecified: Secondary | ICD-10-CM | POA: Diagnosis not present

## 2018-03-09 DIAGNOSIS — D649 Anemia, unspecified: Secondary | ICD-10-CM

## 2018-03-09 DIAGNOSIS — I4891 Unspecified atrial fibrillation: Secondary | ICD-10-CM | POA: Diagnosis not present

## 2018-03-09 DIAGNOSIS — Z79890 Hormone replacement therapy (postmenopausal): Secondary | ICD-10-CM

## 2018-03-09 DIAGNOSIS — K219 Gastro-esophageal reflux disease without esophagitis: Secondary | ICD-10-CM | POA: Diagnosis present

## 2018-03-09 DIAGNOSIS — I10 Essential (primary) hypertension: Secondary | ICD-10-CM | POA: Diagnosis not present

## 2018-03-09 DIAGNOSIS — Z8249 Family history of ischemic heart disease and other diseases of the circulatory system: Secondary | ICD-10-CM

## 2018-03-09 DIAGNOSIS — E039 Hypothyroidism, unspecified: Secondary | ICD-10-CM | POA: Diagnosis not present

## 2018-03-09 DIAGNOSIS — I480 Paroxysmal atrial fibrillation: Secondary | ICD-10-CM | POA: Diagnosis not present

## 2018-03-09 DIAGNOSIS — M109 Gout, unspecified: Secondary | ICD-10-CM | POA: Diagnosis present

## 2018-03-09 DIAGNOSIS — N179 Acute kidney failure, unspecified: Secondary | ICD-10-CM | POA: Diagnosis not present

## 2018-03-09 DIAGNOSIS — N184 Chronic kidney disease, stage 4 (severe): Secondary | ICD-10-CM | POA: Diagnosis not present

## 2018-03-09 DIAGNOSIS — M0560 Rheumatoid arthritis of unspecified site with involvement of other organs and systems: Secondary | ICD-10-CM | POA: Diagnosis not present

## 2018-03-09 DIAGNOSIS — E875 Hyperkalemia: Secondary | ICD-10-CM | POA: Diagnosis not present

## 2018-03-09 DIAGNOSIS — N2581 Secondary hyperparathyroidism of renal origin: Secondary | ICD-10-CM | POA: Diagnosis present

## 2018-03-09 DIAGNOSIS — I5031 Acute diastolic (congestive) heart failure: Secondary | ICD-10-CM | POA: Diagnosis not present

## 2018-03-09 DIAGNOSIS — Z7901 Long term (current) use of anticoagulants: Secondary | ICD-10-CM

## 2018-03-09 DIAGNOSIS — E8770 Fluid overload, unspecified: Secondary | ICD-10-CM | POA: Diagnosis not present

## 2018-03-09 DIAGNOSIS — E43 Unspecified severe protein-calorie malnutrition: Secondary | ICD-10-CM | POA: Diagnosis not present

## 2018-03-09 DIAGNOSIS — I5042 Chronic combined systolic (congestive) and diastolic (congestive) heart failure: Secondary | ICD-10-CM | POA: Diagnosis not present

## 2018-03-09 DIAGNOSIS — N186 End stage renal disease: Secondary | ICD-10-CM

## 2018-03-09 DIAGNOSIS — Z86711 Personal history of pulmonary embolism: Secondary | ICD-10-CM

## 2018-03-09 DIAGNOSIS — I130 Hypertensive heart and chronic kidney disease with heart failure and stage 1 through stage 4 chronic kidney disease, or unspecified chronic kidney disease: Secondary | ICD-10-CM | POA: Diagnosis not present

## 2018-03-09 DIAGNOSIS — R5381 Other malaise: Secondary | ICD-10-CM | POA: Diagnosis not present

## 2018-03-09 DIAGNOSIS — K5900 Constipation, unspecified: Secondary | ICD-10-CM | POA: Diagnosis not present

## 2018-03-09 DIAGNOSIS — R0602 Shortness of breath: Secondary | ICD-10-CM | POA: Diagnosis not present

## 2018-03-09 DIAGNOSIS — L89152 Pressure ulcer of sacral region, stage 2: Secondary | ICD-10-CM | POA: Diagnosis not present

## 2018-03-09 DIAGNOSIS — R0600 Dyspnea, unspecified: Secondary | ICD-10-CM

## 2018-03-09 DIAGNOSIS — I132 Hypertensive heart and chronic kidney disease with heart failure and with stage 5 chronic kidney disease, or end stage renal disease: Secondary | ICD-10-CM | POA: Diagnosis not present

## 2018-03-09 DIAGNOSIS — E872 Acidosis: Secondary | ICD-10-CM | POA: Diagnosis not present

## 2018-03-09 DIAGNOSIS — E871 Hypo-osmolality and hyponatremia: Secondary | ICD-10-CM | POA: Diagnosis present

## 2018-03-09 DIAGNOSIS — Q211 Atrial septal defect: Secondary | ICD-10-CM | POA: Diagnosis not present

## 2018-03-09 DIAGNOSIS — R262 Difficulty in walking, not elsewhere classified: Secondary | ICD-10-CM | POA: Diagnosis not present

## 2018-03-09 DIAGNOSIS — F419 Anxiety disorder, unspecified: Secondary | ICD-10-CM | POA: Diagnosis present

## 2018-03-09 DIAGNOSIS — Z9181 History of falling: Secondary | ICD-10-CM | POA: Diagnosis not present

## 2018-03-09 DIAGNOSIS — E876 Hypokalemia: Secondary | ICD-10-CM | POA: Diagnosis not present

## 2018-03-09 DIAGNOSIS — I071 Rheumatic tricuspid insufficiency: Secondary | ICD-10-CM | POA: Diagnosis present

## 2018-03-09 HISTORY — DX: Heart failure, unspecified: I50.9

## 2018-03-09 HISTORY — DX: Rheumatoid arthritis, unspecified: M06.9

## 2018-03-09 HISTORY — DX: Chronic kidney disease, stage 4 (severe): N18.4

## 2018-03-09 HISTORY — DX: Personal history of other medical treatment: Z92.89

## 2018-03-09 HISTORY — DX: Pneumonia, unspecified organism: J18.9

## 2018-03-09 LAB — CBC WITH DIFFERENTIAL/PLATELET
BASOS ABS: 0 10*3/uL (ref 0.0–0.1)
Basophils Relative: 0 %
EOS PCT: 0 %
Eosinophils Absolute: 0 10*3/uL (ref 0.0–0.7)
HCT: 31.2 % — ABNORMAL LOW (ref 36.0–46.0)
Hemoglobin: 10.1 g/dL — ABNORMAL LOW (ref 12.0–15.0)
LYMPHS ABS: 1.3 10*3/uL (ref 0.7–4.0)
Lymphocytes Relative: 5 %
MCH: 26.9 pg (ref 26.0–34.0)
MCHC: 32.4 g/dL (ref 30.0–36.0)
MCV: 83 fL (ref 78.0–100.0)
MONO ABS: 0.8 10*3/uL (ref 0.1–1.0)
Monocytes Relative: 3 %
Neutro Abs: 23.5 10*3/uL — ABNORMAL HIGH (ref 1.7–7.7)
Neutrophils Relative %: 92 %
PLATELETS: 258 10*3/uL (ref 150–400)
RBC: 3.76 MIL/uL — AB (ref 3.87–5.11)
RDW: 17.3 % — AB (ref 11.5–15.5)
WBC: 25.6 10*3/uL — AB (ref 4.0–10.5)

## 2018-03-09 LAB — COMPREHENSIVE METABOLIC PANEL
ALT: 81 U/L — ABNORMAL HIGH (ref 14–54)
AST: 22 U/L (ref 15–41)
Albumin: 2.3 g/dL — ABNORMAL LOW (ref 3.5–5.0)
Alkaline Phosphatase: 206 U/L — ABNORMAL HIGH (ref 38–126)
Anion gap: 19 — ABNORMAL HIGH (ref 5–15)
BILIRUBIN TOTAL: 0.6 mg/dL (ref 0.3–1.2)
BUN: 103 mg/dL — AB (ref 6–20)
CHLORIDE: 85 mmol/L — AB (ref 101–111)
CO2: 22 mmol/L (ref 22–32)
CREATININE: 5.09 mg/dL — AB (ref 0.44–1.00)
Calcium: 8.5 mg/dL — ABNORMAL LOW (ref 8.9–10.3)
GFR calc Af Amer: 8 mL/min — ABNORMAL LOW (ref >60)
GFR, EST NON AFRICAN AMERICAN: 7 mL/min — AB (ref >60)
Glucose, Bld: 99 mg/dL (ref 65–99)
POTASSIUM: 4.2 mmol/L (ref 3.5–5.1)
Sodium: 126 mmol/L — ABNORMAL LOW (ref 135–145)
TOTAL PROTEIN: 5.3 g/dL — AB (ref 6.5–8.1)

## 2018-03-09 LAB — APTT: APTT: 52 s — AB (ref 24–36)

## 2018-03-09 LAB — PROTIME-INR
INR: 3.15
Prothrombin Time: 32.1 seconds — ABNORMAL HIGH (ref 11.4–15.2)

## 2018-03-09 LAB — MAGNESIUM: MAGNESIUM: 1.9 mg/dL (ref 1.7–2.4)

## 2018-03-09 MED ORDER — METHYLPREDNISOLONE 4 MG PO TABS
4.0000 mg | ORAL_TABLET | Freq: Every day | ORAL | Status: DC
  Administered 2018-03-10 – 2018-03-24 (×14): 4 mg via ORAL
  Filled 2018-03-09 (×20): qty 1

## 2018-03-09 MED ORDER — RISAQUAD PO CAPS
1.0000 | ORAL_CAPSULE | Freq: Every day | ORAL | Status: DC
  Administered 2018-03-10 – 2018-03-24 (×14): 1 via ORAL
  Filled 2018-03-09 (×16): qty 1

## 2018-03-09 MED ORDER — SODIUM CHLORIDE 0.9% FLUSH
3.0000 mL | Freq: Two times a day (BID) | INTRAVENOUS | Status: DC
  Administered 2018-03-10 – 2018-03-22 (×20): 3 mL via INTRAVENOUS
  Administered 2018-03-23: 10 mL via INTRAVENOUS
  Administered 2018-03-24: 3 mL via INTRAVENOUS

## 2018-03-09 MED ORDER — CALCITRIOL 0.25 MCG PO CAPS
0.2500 ug | ORAL_CAPSULE | ORAL | Status: DC
  Administered 2018-03-11 – 2018-03-23 (×6): 0.25 ug via ORAL
  Filled 2018-03-09 (×7): qty 1

## 2018-03-09 MED ORDER — ACETAMINOPHEN 325 MG PO TABS
650.0000 mg | ORAL_TABLET | ORAL | Status: DC | PRN
  Administered 2018-03-10 – 2018-03-22 (×24): 650 mg via ORAL
  Filled 2018-03-09 (×24): qty 2

## 2018-03-09 MED ORDER — FOLIC ACID 1 MG PO TABS
1.0000 mg | ORAL_TABLET | Freq: Every day | ORAL | Status: DC
  Administered 2018-03-10 – 2018-03-24 (×14): 1 mg via ORAL
  Filled 2018-03-09 (×16): qty 1

## 2018-03-09 MED ORDER — LEVOTHYROXINE SODIUM 75 MCG PO TABS
75.0000 ug | ORAL_TABLET | Freq: Every day | ORAL | Status: DC
  Administered 2018-03-10 – 2018-03-24 (×14): 75 ug via ORAL
  Filled 2018-03-09 (×16): qty 1

## 2018-03-09 MED ORDER — ENSURE ENLIVE PO LIQD
237.0000 mL | Freq: Two times a day (BID) | ORAL | Status: DC
  Administered 2018-03-10: 237 mL via ORAL

## 2018-03-09 MED ORDER — ONDANSETRON HCL 4 MG/2ML IJ SOLN
4.0000 mg | Freq: Four times a day (QID) | INTRAMUSCULAR | Status: DC | PRN
  Administered 2018-03-14 – 2018-03-21 (×2): 4 mg via INTRAVENOUS
  Filled 2018-03-09 (×2): qty 2

## 2018-03-09 MED ORDER — SODIUM BICARBONATE 650 MG PO TABS
650.0000 mg | ORAL_TABLET | Freq: Three times a day (TID) | ORAL | Status: DC
  Administered 2018-03-10 – 2018-03-13 (×11): 650 mg via ORAL
  Filled 2018-03-09 (×11): qty 1

## 2018-03-09 MED ORDER — SODIUM CHLORIDE 0.9% FLUSH: 3.0000 mL | INTRAVENOUS | Status: DC | PRN

## 2018-03-09 MED ORDER — SODIUM CHLORIDE 0.9 % IV SOLN: 250.0000 mL | INTRAVENOUS | Status: DC | PRN

## 2018-03-09 NOTE — H&P (Signed)
History and Physical    Rachel Johns NOB:[id number] DOB: 1934/08/14 DOA: (Not on file)  PCP: Street, Stephanie Coup, MD  Patient coming from: Transfer from [place]  Chief Complaint: Shortness of breath  I have personally briefly reviewed patient's old medical records in [facility]   HPI: Rachel Johns is a 84 y.o. female with medical history significant of HTN, CKD stage IV, RA, hypothyroidism, CHF last EF 40-45%, presented with worsening exertional shortness of breath, worsening lower extremity edema along with a 7 lb weight gain to [facility] on 4/3.  She was also found to be in atrial fibrillation with RVR at a rate of 133 bpm, and probably decompensated diastolic heart failure. Started on IV Lasix, IV Cardizem and IV heparin and admitted to the hospitalist service. She spontaneously converted to sinus rhythm-however further into hospital course but it has been complicated by worsening renal function, hyponatremia, and persistent leukocytosis.  Patient ultimately was transferred for need of hemodialysis.  See assessment plan for further details of hospital course.  ED Course: As seen above  Review of Systems  Constitutional: Positive for malaise/fatigue. Negative for chills and fever.  HENT: Negative for ear pain and nosebleeds.   Eyes: Negative for double vision and photophobia.  Respiratory: Positive for cough and shortness of breath.   Cardiovascular: Positive for leg swelling. Negative for chest pain and palpitations.  Gastrointestinal: Negative for abdominal pain, nausea and vomiting.  Genitourinary: Negative for dysuria and hematuria.  Musculoskeletal: Positive for joint pain. Negative for falls.  Neurological: Negative for focal weakness and loss of consciousness.  Endo/Heme/Allergies: Negative for polydipsia. Bruises/bleeds easily.  Psychiatric/Behavioral: Negative for substance abuse and suicidal ideas.    Past Medical History:  Diagnosis Date  .  Anemia   . Arthritis   . Chronic kidney disease    Stage 4  . GERD (gastroesophageal reflux disease)   . Gout   . Hx of pulmonary embolus   . Hypertension   . Hypothyroidism   . Pericardial effusion     Past Surgical History:  Procedure Laterality Date  . ABDOMINAL HYSTERECTOMY    . APPENDECTOMY    . AV FISTULA PLACEMENT  06/27/2009   right upper arm BC vein AVF  . AV FISTULA PLACEMENT Left 02/14/2018   Procedure: INSERTION OF ARTERIOVENOUS (AV) GORE-TEX GRAFT ARM;  Surgeon: Sherren Kerns, MD;  Location: [facility] OR;  Service: Vascular;  Laterality: Left;  . CATARACT EXTRACTION, BILATERAL  2012  . COLONOSCOPY    . JOINT REPLACEMENT  1995   right hip  . PARTIAL HIP ARTHROPLASTY Left      reports that she has never smoked. She has never used smokeless tobacco. She reports that she does not drink alcohol or use drugs.  Allergies  Allergen Reactions  . Tape Other (See Comments)    Tears skin - please use paper tape  . Levaquin [Levofloxacin In D5w] Other (See Comments)    Per patient this caused insomnia     Family History  Problem Relation Age of Onset  . Heart disease Mother   . Heart attack Father   . Cancer Sister   . Other Sister        varicose veins  . Diabetes Brother   . Heart attack Daughter     Prior to Admission medications   Medication Sig Start Date End Date Taking? Authorizing Provider  acetaminophen (TYLENOL) 325 MG tablet Take 325-650 mg by mouth 2 (two) times daily as needed  for moderate pain or headache.     [provider]  allopurinol (ZYLOPRIM) 100 MG tablet Take 100 mg by mouth daily.  10/16/12   [provider]  amLODipine (NORVASC) 10 MG tablet Take 1 tablet (10 mg total) by mouth daily. Patient not taking: Reported on 01/27/2018 01/07/17   Chilton Si, MD  calcitRIOL (ROCALTROL) 0.25 MCG capsule Take 0.25 mcg by mouth every Monday, Wednesday, and Friday.  10/16/12   [provider]  cholecalciferol (VITAMIN D)  1000 UNITS tablet Take 2,000 Units by mouth daily.     [provider]  cloNIDine (CATAPRES) 0.2 MG tablet Take 0.2 mg by mouth 2 (two) times daily with a meal.  09/10/12   [provider]  doxylamine, Sleep, (UNISOM) 25 MG tablet Take 25 mg by mouth at bedtime as needed for sleep.    [provider]  Epoetin Alfa (PROCRIT IJ) Inject 1 each as directed as needed (for low hemoglobin).     [provider]  folic acid (FOLVITE) 1 MG tablet Take 1 mg by mouth daily.    [provider]  furosemide (LASIX) 20 MG tablet Take 20 mg by mouth daily.     [provider]  Lactobacillus Rhamnosus, GG, (CULTURELLE PO) Take 1 capsule by mouth daily.    [provider]  levothyroxine (SYNTHROID, LEVOTHROID) 75 MCG tablet Take 75 mcg by mouth daily before breakfast.  09/28/12   [provider]  methylPREDNISolone (MEDROL) 4 MG tablet Take 1 tablet (4 mg total) by mouth daily. Continuous course for arthritis pain. HOLD WHILE YOU ARE ON HIGHER DOSE PREDNISONE AND TAPER IS COMPLETED. Patient taking differently: Take 4 mg by mouth daily.  12/09/16   Rai, Delene Ruffini, MD  ranitidine (ZANTAC) 150 MG tablet Take 150 mg by mouth 2 (two) times daily.     [provider]  sodium bicarbonate 650 MG tablet Take 650 mg by mouth 3 (three) times daily.     [provider]  traMADol (ULTRAM) 50 MG tablet Take 1 tablet (50 mg total) by mouth every 6 (six) hours as needed for up to 10 doses. 02/14/18   Emilie Rutter, PA-C    Physical Exam:  Constitutional: NAD, calm, comfortable Vitals:   03/09/18 2045 03/10/18 0059 03/10/18 0420 03/10/18 0530  BP: 122/73   (!) 164/72  Pulse: 77   65  Resp: 16   16  Temp: 97.7 F (36.5 C)   (!) 97.4 F (36.3 C)  TempSrc: Oral   Oral  SpO2: 99%   95%  Weight: 60.9 kg (134 lb 4.2 oz)  60.9 kg (134 lb 4.2 oz)   Height:  5' (1.524 m)     Eyes: PERRL, lids and conjunctivae normal ENMT: Mucous membranes  are moist. Posterior pharynx clear of any exudate or lesions.  Neck: normal, supple, no masses, no thyromegaly.  Positive JVD Respiratory: Normal respiratory effort with positive crackles appreciated in both lung field.  No significant wheezes or rhonchi noted.  Patient currently on 2 L of oxygen. Cardiovascular: Regular rate and rhythm, no murmurs / rubs / gallops.  +2 pitting lower extremity edema 2+ pedal pulses. No carotid bruits.  Abdomen: no tenderness, no masses palpated. No hepatosplenomegaly. Bowel sounds positive.  Musculoskeletal: no clubbing / cyanosis. No joint deformity upper and lower extremities. Good ROM, no contractures. Normal muscle tone.  Skin: no rashes, lesions, ulcers. No induration Neurologic: CN 2-12 grossly intact. Sensation intact, DTR normal. Strength 5/5 in all  4.  Psychiatric: Normal judgment and insight. Alert and oriented x 3. Normal mood.     Labs on Admission: I have personally reviewed following labs and imaging studies  CBC: No results for input(s): WBC, NEUTROABS, HGB, HCT, MCV, PLT in the last 168 hours. Basic Metabolic Panel: No results for input(s): NA, K, CL, CO2, GLUCOSE, BUN, CREATININE, CALCIUM, MG, PHOS in the last 168 hours. GFR: CrCl cannot be calculated (Patient's most recent lab result is older than the maximum 21 days allowed.). Liver Function Tests: No results for input(s): AST, ALT, ALKPHOS, BILITOT, PROT, ALBUMIN in the last 168 hours. No results for input(s): LIPASE, AMYLASE in the last 168 hours. No results for input(s): AMMONIA in the last 168 hours. Coagulation Profile: No results for input(s): INR, PROTIME in the last 168 hours. Cardiac Enzymes: No results for input(s): CKTOTAL, CKMB, CKMBINDEX, TROPONINI in the last 168 hours. BNP (last 3 results) No results for input(s): PROBNP in the last 8760 hours. HbA1C: No results for input(s): HGBA1C in the last 72 hours. CBG: No results for input(s): GLUCAP in the last 168  hours. Lipid Profile: No results for input(s): CHOL, HDL, LDLCALC, TRIG, CHOLHDL, LDLDIRECT in the last 72 hours. Thyroid Function Tests: No results for input(s): TSH, T4TOTAL, FREET4, T3FREE, THYROIDAB in the last 72 hours. Anemia Panel: No results for input(s): VITAMINB12, FOLATE, FERRITIN, TIBC, IRON, RETICCTPCT in the last 72 hours. Urine analysis:    Component Value Date/Time   COLORURINE YELLOW 12/04/2016 1839   APPEARANCEUR CLEAR 12/04/2016 1839   LABSPEC 1.011 12/04/2016 1839   PHURINE 5.0 12/04/2016 1839   GLUCOSEU NEGATIVE 12/04/2016 1839   HGBUR NEGATIVE 12/04/2016 1839   BILIRUBINUR NEGATIVE 12/04/2016 1839   KETONESUR NEGATIVE 12/04/2016 1839   PROTEINUR NEGATIVE 12/04/2016 1839   NITRITE NEGATIVE 12/04/2016 1839   LEUKOCYTESUR NEGATIVE 12/04/2016 1839   Sepsis Labs: No results found for this or any previous visit (from the past 240 hour(s)).   Radiological Exams on Admission: No results found.  EKG: Independently reviewed.  Sinus rhythm at 69 bpm  Assessment/Plan Acute kidney injury on chronic kidney disease stage 4:  - Acute kidney injury likely hemodynamically mediated in the setting of decompensated heart failure, AFib with RVR, possible pneumonia. She still appears to be volume overloaded-remains on high-dose diuretics, Potassium and other electrolytes relatively stable-adequate urine output- Patient had a AV fistula placed 02/14/2018, not ready to be used yet. - patient with significant hyponatremia and volume overload, despite being on significant dose IV Lasix, currently she is on 160 mg IV Q6H, but main issue she continues to have worsening hyponatremia, I have discussed with her primary nephrologist at [facility] health Dr. Eliott Nine today, overall diuresis limited by her hyponatremia, likely she will need to be initiated on hemodialysis during hospital stay, so transfer will be arranged to [facility], will she will be. Seen by nephrologist discussed that - her IV  fistula as noted used yet, likely she will need temporary hemodialysis catheter if it was decided to start on dialysis, her INR is 4.3, this needs to be reversed, or trend down, likely she will need to be on heparin GTT once her INR less than 2.  Atrial fibrillation with RVR:  - this appears to be new onset., no further diagnosis of AFib with RVR. - She was maintaining sinus rhythm for the past few days-appears to be in paroxysmal AFib over last 24 hr, required Cardizem drip intermittently, currently she is heart rate controlled on metoprolol 75 mg  twice daily, she is on warfarin for anticoagulation has not a candidate for NOAC given and renal function. CHADS2 Vasc score of at least 5 - her INR is elevated at 4.3 today, so she was not dose with warfarin today.  Persistent leukocytosis:  - Initially not felt to have any source of infection-however due to significantly elevated procalcitonin-and chest x-ray suggestion of pneumonia-patient was empirically started on Rocephin and Zithromax. However leukocytosis continued to worsen-blood cultures continue to be negative, CT of the chest and abdomen did not show any obvious infection (although CT chest showed some atelectasis). No other foci of infection apparent-no diarrhea. Patient has been since changed to cefepime-although she has significant leukocytosis-she is stable-afebrile-does not look acutely ill-procalcitonin is slowly trending down. Dr. Toula Moos with Hematology-Dr. Ferol Luz reviewed peripheral smear-no evidence of immature cells/chronic leukemia-per Hematology-peripheral smear consistent with a infectious process. Plans are to follow for now. - patient with some loose bowel movement today, but her C diff is negative  Decompensated systolic heart failure:  - Still with significant lower extremity edema-but lungs are clear-JVD still elevated. Given worsening hyponatremia-and persistent fluid overload- discussed case with patient's primary  nephrologist Dr. Eliott Nine over the phone-recommendations are to increase Lasix to 160 mg IV Q 6. Continue beta-blocker, hydralazine and Imdur. Not a candidate for ACE/ARB given advanced CKD. Echocardiogram done on 04/04 shows some reduction in the EF to around 40- 45%, and severe tricuspid regurgitation with a large PFO. Appreciate Cardiology input Hyponatremia:  - Likely secondary to volume overload-and use of metolazone (last dose on 04/06)-due to worsening hyponatremia and persistent volume overload-please see above discussion.  Hyperkalemia:  - Resolved. Likely secondary to advanced chronic kidney disease  PFO:  - Seen incidentally on echocardiogram-will need outpatient follow-up by Cardiology to see if she is a candidate for percutaneous closure.  Recent history of pericardial effusion:  - 2D echocardiogram on 04/04-shows slight decrease in and the pericardial effusion without any tamponade features.  Hypothyroidism:  - Continue Synthroid- TSH within normal limits  Essential Hypertension:  - Uncontrolled-increase metoprolol to 75 mg twice daily, increase hydralazine to 50 mg t.i.d.-continue with current dosing of Imdur. Remains on high-dose diuretics as well. Follow and adjust accordingly.   Rheumatoid arthritis:  - Steroid dependent-continue methylprednisone.  Anemia:  - Likely secondary to chronic kidney disease-follows with Dr. Julaine Hua panel consistent with anemia of chronic kidney disease-spoke with Dr. Daivd Council 20000 units of Procrit 4/9, and also transfused 1 units of PRBC as well. Hemoglobin is currently stable-continue to follow.  Gout:  - No evidence of flare-Continue allopurinol   DVT prophylaxis:on Hold  Code Status: full  Family Communication: no family present at bedside Disposition Plan: TBD  Consults called: nephrology Admission status: inpatient   Clydie Braun MD [facility] Pager [phone]   If 7PM-7AM, please contact  night-coverage [email or web address] Password [email or web address]  03/09/2018, 9:30 PM

## 2018-03-10 ENCOUNTER — Encounter: Payer: Medicare Other | Admitting: Vascular Surgery

## 2018-03-10 DIAGNOSIS — I509 Heart failure, unspecified: Secondary | ICD-10-CM

## 2018-03-10 DIAGNOSIS — E8770 Fluid overload, unspecified: Secondary | ICD-10-CM

## 2018-03-10 DIAGNOSIS — I480 Paroxysmal atrial fibrillation: Secondary | ICD-10-CM | POA: Diagnosis present

## 2018-03-10 DIAGNOSIS — N189 Chronic kidney disease, unspecified: Secondary | ICD-10-CM

## 2018-03-10 DIAGNOSIS — I5041 Acute combined systolic (congestive) and diastolic (congestive) heart failure: Secondary | ICD-10-CM | POA: Insufficient documentation

## 2018-03-10 DIAGNOSIS — L8990 Pressure ulcer of unspecified site, unspecified stage: Secondary | ICD-10-CM | POA: Insufficient documentation

## 2018-03-10 DIAGNOSIS — Q2112 Patent foramen ovale: Secondary | ICD-10-CM | POA: Insufficient documentation

## 2018-03-10 DIAGNOSIS — N179 Acute kidney failure, unspecified: Principal | ICD-10-CM

## 2018-03-10 DIAGNOSIS — Q211 Atrial septal defect: Secondary | ICD-10-CM | POA: Insufficient documentation

## 2018-03-10 LAB — CBC WITH DIFFERENTIAL/PLATELET
BAND NEUTROPHILS: 0 %
Basophils Absolute: 0 10*3/uL (ref 0.0–0.1)
Basophils Relative: 0 %
Blasts: 0 %
EOS ABS: 0 10*3/uL (ref 0.0–0.7)
EOS PCT: 0 %
HCT: 29.3 % — ABNORMAL LOW (ref 36.0–46.0)
Hemoglobin: 9.4 g/dL — ABNORMAL LOW (ref 12.0–15.0)
LYMPHS ABS: 2.5 10*3/uL (ref 0.7–4.0)
Lymphocytes Relative: 10 %
MCH: 26.8 pg (ref 26.0–34.0)
MCHC: 32.1 g/dL (ref 30.0–36.0)
MCV: 83.5 fL (ref 78.0–100.0)
METAMYELOCYTES PCT: 0 %
MONOS PCT: 2 %
Monocytes Absolute: 0.5 10*3/uL (ref 0.1–1.0)
Myelocytes: 0 %
NEUTROS ABS: 21.9 10*3/uL — AB (ref 1.7–7.7)
Neutrophils Relative %: 88 %
OTHER: 0 %
PLATELETS: 258 10*3/uL (ref 150–400)
Promyelocytes Relative: 0 %
RBC: 3.51 MIL/uL — ABNORMAL LOW (ref 3.87–5.11)
RDW: 17.6 % — AB (ref 11.5–15.5)
WBC: 24.9 10*3/uL — ABNORMAL HIGH (ref 4.0–10.5)
nRBC: 0 /100 WBC

## 2018-03-10 LAB — RENAL FUNCTION PANEL
Albumin: 2.2 g/dL — ABNORMAL LOW (ref 3.5–5.0)
Anion gap: 21 — ABNORMAL HIGH (ref 5–15)
BUN: 106 mg/dL — AB (ref 6–20)
CHLORIDE: 86 mmol/L — AB (ref 101–111)
CO2: 18 mmol/L — ABNORMAL LOW (ref 22–32)
CREATININE: 5.09 mg/dL — AB (ref 0.44–1.00)
Calcium: 8.5 mg/dL — ABNORMAL LOW (ref 8.9–10.3)
GFR calc Af Amer: 8 mL/min — ABNORMAL LOW (ref >60)
GFR calc non Af Amer: 7 mL/min — ABNORMAL LOW (ref >60)
GLUCOSE: 79 mg/dL (ref 65–99)
Phosphorus: 8.8 mg/dL — ABNORMAL HIGH (ref 2.5–4.6)
Potassium: 4.1 mmol/L (ref 3.5–5.1)
Sodium: 125 mmol/L — ABNORMAL LOW (ref 135–145)

## 2018-03-10 LAB — MRSA PCR SCREENING: MRSA BY PCR: NEGATIVE

## 2018-03-10 LAB — PROTIME-INR
INR: 2.87
Prothrombin Time: 29.8 seconds — ABNORMAL HIGH (ref 11.4–15.2)

## 2018-03-10 MED ORDER — HYDRALAZINE HCL 50 MG PO TABS
50.0000 mg | ORAL_TABLET | Freq: Three times a day (TID) | ORAL | Status: DC
  Administered 2018-03-10 – 2018-03-25 (×37): 50 mg via ORAL
  Filled 2018-03-10 (×40): qty 1

## 2018-03-10 MED ORDER — FAMOTIDINE 20 MG PO TABS
20.0000 mg | ORAL_TABLET | Freq: Every day | ORAL | Status: DC
  Administered 2018-03-10 – 2018-03-24 (×14): 20 mg via ORAL
  Filled 2018-03-10 (×16): qty 1

## 2018-03-10 MED ORDER — METOPROLOL TARTRATE 5 MG/5ML IV SOLN
5.0000 mg | Freq: Once | INTRAVENOUS | Status: AC
  Administered 2018-03-10: 5 mg via INTRAVENOUS
  Filled 2018-03-10: qty 5

## 2018-03-10 MED ORDER — METOPROLOL TARTRATE 50 MG PO TABS
75.0000 mg | ORAL_TABLET | Freq: Two times a day (BID) | ORAL | Status: DC
  Administered 2018-03-10 – 2018-03-13 (×6): 75 mg via ORAL
  Filled 2018-03-10 (×7): qty 1

## 2018-03-10 MED ORDER — BOOST / RESOURCE BREEZE PO LIQD CUSTOM
1.0000 | Freq: Three times a day (TID) | ORAL | Status: DC
  Administered 2018-03-10 – 2018-03-25 (×28): 1 via ORAL
  Filled 2018-03-10 (×49): qty 1

## 2018-03-10 MED ORDER — CEFEPIME HCL 1 G IJ SOLR
1.0000 g | INTRAMUSCULAR | Status: DC
  Administered 2018-03-10 – 2018-03-11 (×2): 1 g via INTRAVENOUS
  Filled 2018-03-10 (×3): qty 1

## 2018-03-10 MED ORDER — FUROSEMIDE 10 MG/ML IJ SOLN
160.0000 mg | Freq: Four times a day (QID) | INTRAMUSCULAR | Status: DC
  Administered 2018-03-10 – 2018-03-11 (×5): 160 mg via INTRAVENOUS
  Filled 2018-03-10: qty 16
  Filled 2018-03-10: qty 10
  Filled 2018-03-10 (×4): qty 16

## 2018-03-10 NOTE — Care Management Note (Addendum)
Case Management Note  Patient Details  Name: Samiha Denapoli MRN: [id number] Date of Birth: October 19, 1934  Subjective/Objective:    History of PE, HTN, CKD stage IV,  s/p LUA AVG about 3 weeks ago (02/14/18) , A, hypothyroidism, CHF w/ EF of 40-45%.  Admitted as transfer from [facility] for Acute kidney injury on chronic kidney disease stage 4, A-fib, Acute exacerbation of diastolic heart failure  Action/Plan: In to speak with patient, patient up in chair with head resting on left arm rest. Responding minimally to verbal command.Prior to admission patient lived at home alone.  NCM will continue to monitor for any discharge transition needs.  Expected Discharge Date:       To Be Determined           Expected Discharge Plan:  Home/Self Care  Discharge planning Services  CM Consult  DME Arranged:    DME Agency:     HH Arranged:    HH Agency:     Status of Service:  In process, will continue to follow  Yancey Flemings, RN  Nurse Case Manager [place] 03/10/2018, 1:15 PM

## 2018-03-10 NOTE — Evaluation (Signed)
Physical Therapy Evaluation Patient Details Name: Rachel Johns MRN: [id number] DOB: February 04, 1934 Today's Date: 03/10/2018   History of Present Illness  Pt is an 84 y/o female admitted secondary to SOB and found to be in a-fib with RVR. PMH including but not limited to CHF, HTN and CKD.    Clinical Impression  Pt presented sidelying in bed with HOB elevated, awake and willing to participate in therapy session. Pt very restless and appearing uncomfortable throughout; however, agreeable to transfers. No family or caregivers present during evaluation to provide any reliable information regarding pt's PLOF or home environment. Pt stated that she lives alone, but according to pt's RN she has a husband that lives with her. Pt also stated that she is independent with ambulation and ADLs, but observed a non-hospital administered cane in pt's room that she was completely unaware of. Pt currently requires min A with bed mobility and close min guard for transfers. Pt unable to tolerate ambulation this session. Pt would continue to benefit from skilled physical therapy services at this time while admitted and after d/c to address the below listed limitations in order to improve overall safety and independence with functional mobility.      Follow Up Recommendations SNF;Supervision/Assistance - 24 hour;Other (comment)(for ST rehab, pending mobility progress)    Equipment Recommendations  None recommended by PT    Recommendations for Other Services       Precautions / Restrictions Restrictions Weight Bearing Restrictions: No      Mobility  Bed Mobility Overal bed mobility: Needs Assistance Bed Mobility: Sidelying to Sit   Sidelying to sit: Min assist       General bed mobility comments: increased time and effort, assist with trunk elevation  Transfers Overall transfer level: Needs assistance Equipment used: None Transfers: Sit to/from Stand;Stand Pivot Transfers Sit to Stand: Min  guard Stand pivot transfers: Min guard       General transfer comment: pt maintaining a relatively flexed posture during transfers; refusing use of an AD; pt reaching for armrests of chair and sitting prematurely   Ambulation/Gait                Stairs            Wheelchair Mobility    Modified Rankin (Stroke Patients Only)       Balance Overall balance assessment: Needs assistance Sitting-balance support: Feet supported Sitting balance-Leahy Scale: Poor Sitting balance - Comments: pt fluctuating between close min guard with and without UE supports   Standing balance support: During functional activity;Single extremity supported;Bilateral upper extremity supported Standing balance-Leahy Scale: Poor                               Pertinent Vitals/Pain Pain Assessment: Faces Faces Pain Scale: Hurts whole lot Pain Location: unable to specify Pain Descriptors / Indicators: Grimacing;Guarding Pain Intervention(s): Monitored during session;Repositioned    Home Living Family/patient expects to be discharged to:: Private residence Living Arrangements: Alone   Type of Home: House Home Access: Level entry     Home Layout: One level Home Equipment: Cane - quad Additional Comments: unsure how reliable information is given it was provided by pt and no family/caregivers present    Prior Function Level of Independence: Independent         Comments: unsure how reliable information is given it was provided by pt and no family/caregivers present; cane present in room     Hand  Dominance        Extremity/Trunk Assessment   Upper Extremity Assessment Upper Extremity Assessment: Generalized weakness    Lower Extremity Assessment Lower Extremity Assessment: Generalized weakness    Cervical / Trunk Assessment Cervical / Trunk Assessment: Kyphotic  Communication   Communication: No difficulties  Cognition Arousal/Alertness:  Awake/alert Behavior During Therapy: Flat affect;Restless Overall Cognitive Status: No family/caregiver present to determine baseline cognitive functioning Area of Impairment: Attention;Memory;Following commands;Safety/judgement;Problem solving                   Current Attention Level: Sustained Memory: Decreased short-term memory;Decreased recall of precautions Following Commands: Follows one step commands with increased time;Follows one step commands inconsistently Safety/Judgement: Decreased awareness of deficits;Decreased awareness of safety   Problem Solving: Difficulty sequencing;Requires verbal cues        General Comments      Exercises     Assessment/Plan    PT Assessment Patient needs continued PT services  PT Problem List Decreased strength;Decreased activity tolerance;Decreased balance;Decreased mobility;Decreased coordination;Decreased cognition;Decreased safety awareness       PT Treatment Interventions DME instruction;Gait training;Stair training;Functional mobility training;Therapeutic activities;Therapeutic exercise;Balance training;Neuromuscular re-education;Patient/family education;Cognitive remediation    PT Goals (Current goals can be found in the Care Plan section)  Acute Rehab PT Goals Patient Stated Goal: unable to state PT Goal Formulation: Patient unable to participate in goal setting Time For Goal Achievement: 03/24/18 Potential to Achieve Goals: Fair    Frequency Min 3X/week   Barriers to discharge        Co-evaluation               AM-PAC PT "6 Clicks" Daily Activity  Outcome Measure Difficulty turning over in bed (including adjusting bedclothes, sheets and blankets)?: None Difficulty moving from lying on back to sitting on the side of the bed? : Unable Difficulty sitting down on and standing up from a chair with arms (e.g., wheelchair, bedside commode, etc,.)?: A Lot Help needed moving to and from a bed to chair (including  a wheelchair)?: A Little Help needed walking in hospital room?: A Little Help needed climbing 3-5 steps with a railing? : A Lot 6 Click Score: 15    End of Session   Activity Tolerance: Patient limited by fatigue;Patient limited by pain Patient left: in chair;with call bell/phone within reach Nurse Communication: Mobility status PT Visit Diagnosis: Other abnormalities of gait and mobility (R26.89);Muscle weakness (generalized) (M62.81)    Time: 6144-3154 PT Time Calculation (min) (ACUTE ONLY): 13 min   Charges:   PT Evaluation $PT Eval Low Complexity: 1 Low     PT G Codes:        [place], PT, DPT [phone]   Alessandra Bevels Derrel Moore 03/10/2018, 5:04 PM

## 2018-03-10 NOTE — Progress Notes (Signed)
Patient ID: Rachel Johns, female   DOB: [birth year], 84 y.o.   MRN: [id number]  PROGRESS NOTE    Rachel Johns  LSL:[id number] DOB: [birth year] DOA: 03/09/2018 PCP: Casper Harrison Stephanie Coup, MD   Brief Narrative:  84 year old female with history of hypertension, CKD stage IV, RA, hypothyroidism, CHF with last EF of 40-45% presented with worsening shortness of breath, worsening lower extremity edema and 7 pound weight gain to [facility] on 03/02/2018.  She was found to be in atrial fibrillation with rapid ventricular rate and probably decompensated diastolic heart failure.  She was started on intravenous Lasix, intravenous Cardizem and IV heparin.  She spontaneously converted to sinus rhythm however further hospital course was complicated with worsening renal function, persistent hyponatremia and leukocytosis.  Patient was transferred to [facility] on 03/09/2018 per nephrology request for need for probable initiation of dialysis.   Assessment & Plan:   Principal Problem:   Acute kidney injury superimposed on chronic kidney disease (HCC) Active Problems:   Rheumatoid arthritis (HCC)   Normocytic anemia   Leukocytosis   Hyponatremia   Fluid overload   Pressure injury of skin   AF (paroxysmal atrial fibrillation) (HCC)   Acute decompensated heart failure (HCC)   PFO (patent foramen ovale)   Acute kidney injury on chronic kidney disease stage IV -Patient has been transferred to [facility] per nephrology request.  Will follow further recommendations from nephrology.  Patient might need initiation of dialysis -Labs from this morning are pending.  Monitor urine output -Patient did not respond to high doses of Lasix 160 mill grams IV every 6 hours at [facility].  Paroxysmal atrial fibrillation with rapid ventricular response -Initially treated with Cardizem drip -Currently in sinus rhythm. -Rate controlled.  Continue metoprolol. -On Coumadin.  Follow INR.   Will hold Coumadin for probable need for hemodialysis catheter placement.  Persistent leukocytosis -Probably reactive from above along with steroid use. -Patient was initially started on Rocephin and Zithromax, however leukocytosis continue to worsen with negative blood cultures.  CT of the chest and abdomen did not show any obvious infection.  Antibiotics were changed to cefepime at outside hospital. -Dr. Toula Moos with Hematology-Dr. Ferol Luz reviewed peripheral smear-no evidence of immature cells/chronic leukemia-per Hematology-peripheral smear consistent with a infectious process.  -Repeat a.m. labs.  Acute on chronic systolic and diastolic heart failure -Still appears to be fluid overloaded.  Monitor input and output.  Daily weights. -Continue beta-blocker, hydralazine. -We will follow-up with nephrology regarding diuretic dosing.  Cardiology consulted -Echo on 03/03/2018 showed some reduction in EF to 40-45% and severe tricuspid regurgitation with large PFO.  Hyponatremia -Probably second to volume overload -Repeat a.m. labs.  Follow nephrology evaluation  Hyperkalemia -Resolved.  Repeat a.m. labs today's labs.  Hypothyroidism -Continue Synthroid  Essential hypertension -Monitor blood pressure.  Continue metoprolol and hydralazine.  Rheumatoid arthritis -Steroid dependent.  Continue methylprednisone.  Anemia:  - Likely secondary to chronic kidney disease-follows with Dr. Julaine Hua panel consistent with anemia of chronic kidney disease-spoke with Dr. Daivd Council 20000 units of Procrit 4/9, and also transfused 1 units of PRBC as well. Hemoglobin is currently stable-continue to follow.  Gout:  - No evidence of flare-Continue allopurinol    DVT prophylaxis: On hold because of elevated INR Code Status: Full Family Communication: None at bedside Disposition Plan: Depends on clinical outcome  Consultants: Nephrology/cardiology  Procedures: None  Antimicrobials:  Cefepime   Subjective: Patient seen and examined at bedside.  She does not feel well.  Still complains of intermittent shortness of breath.  No overnight fever, nausea or vomiting.  Objective: Vitals:   03/10/18 0059 03/10/18 0420 03/10/18 0530 03/10/18 0928  BP:   (!) 164/72 (!) 181/84  Pulse:   65 68  Resp:   16 15  Temp:   (!) 97.4 F (36.3 C) (!) 97.5 F (36.4 C)  TempSrc:   Oral Oral  SpO2:   95% 95%  Weight:  60.9 kg (134 lb 4.2 oz)    Height: 5' (1.524 m)       Intake/Output Summary (Last 24 hours) at 03/10/2018 0931 Last data filed at 03/10/2018 0816 Gross per 24 hour  Intake 120 ml  Output 0 ml  Net 120 ml   Filed Weights   03/09/18 2045 03/10/18 0420  Weight: 60.9 kg (134 lb 4.2 oz) 60.9 kg (134 lb 4.2 oz)    Examination:  General exam: Appears calm and comfortable.  No acute distress Respiratory system: Bilateral decreased breath sound at bases with basilar crackles Cardiovascular system: S1 & S2 heard, rate controlled  gastrointestinal system: Abdomen is nondistended, soft and nontender. Normal bowel sounds heard. Central nervous system: Alert and oriented. No focal neurological deficits. Moving extremities Extremities: No cyanosis, clubbing; 1-2+ edema Skin: No rashes, lesions or ulcers Psychiatry: Judgement and insight appear normal. Mood & affect appropriate.     Data Reviewed: I have personally reviewed following labs and imaging studies  CBC: Recent Labs  Lab 03/09/18 2259  WBC 25.6*  NEUTROABS 23.5*  HGB 10.1*  HCT 31.2*  MCV 83.0  PLT 258   Basic Metabolic Panel: Recent Labs  Lab 03/09/18 2259  NA 126*  K 4.2  CL 85*  CO2 22  GLUCOSE 99  BUN 103*  CREATININE 5.09*  CALCIUM 8.5*  MG 1.9   GFR: Estimated Creatinine Clearance: 7 mL/min (A) (by C-G formula based on SCr of 5.09 mg/dL (H)). Liver Function Tests: Recent Labs  Lab 03/09/18 2259  AST 22  ALT 81*  ALKPHOS 206*  BILITOT 0.6  PROT 5.3*  ALBUMIN 2.3*   No  results for input(s): LIPASE, AMYLASE in the last 168 hours. No results for input(s): AMMONIA in the last 168 hours. Coagulation Profile: Recent Labs  Lab 03/09/18 2259  INR 3.15   Cardiac Enzymes: No results for input(s): CKTOTAL, CKMB, CKMBINDEX, TROPONINI in the last 168 hours. BNP (last 3 results) No results for input(s): PROBNP in the last 8760 hours. HbA1C: No results for input(s): HGBA1C in the last 72 hours. CBG: No results for input(s): GLUCAP in the last 168 hours. Lipid Profile: No results for input(s): CHOL, HDL, LDLCALC, TRIG, CHOLHDL, LDLDIRECT in the last 72 hours. Thyroid Function Tests: No results for input(s): TSH, T4TOTAL, FREET4, T3FREE, THYROIDAB in the last 72 hours. Anemia Panel: No results for input(s): VITAMINB12, FOLATE, FERRITIN, TIBC, IRON, RETICCTPCT in the last 72 hours. Sepsis Labs: No results for input(s): PROCALCITON, LATICACIDVEN in the last 168 hours.  Recent Results (from the past 240 hour(s))  MRSA PCR Screening     Status: None   Collection Time: 03/09/18 11:02 PM  Result Value Ref Range Status   MRSA by PCR NEGATIVE NEGATIVE Final    Comment:        The GeneXpert MRSA Assay (FDA approved for NASAL specimens only), is one component of a comprehensive MRSA colonization surveillance program. It is not intended to diagnose MRSA infection nor to guide or monitor treatment for MRSA infections. Performed at [facility],  [address], [place], [place] [address]          Radiology Studies: No results found.      Scheduled Meds: . acidophilus  1 capsule Oral Daily  . [START ON 03/11/2018] calcitRIOL  0.25 mcg Oral Q M,W,F  . famotidine  20 mg Oral Daily  . feeding supplement (ENSURE ENLIVE)  237 mL Oral BID BM  . folic acid  1 mg Oral Daily  . hydrALAZINE  50 mg Oral TID  . levothyroxine  75 mcg Oral QAC breakfast  . methylPREDNISolone  4 mg Oral Daily  . metoprolol tartrate  75 mg Oral BID  . sodium bicarbonate   650 mg Oral TID  . sodium chloride flush  3 mL Intravenous Q12H   Continuous Infusions: . sodium chloride    . ceFEPime (MAXIPIME) IV 1 g (03/10/18 7048)     LOS: 1 day        Glade Lloyd, MD [facility] Pager [phone]  If 7PM-7AM, please contact night-coverage [email or web address] Password [email or web address] 03/10/2018, 9:31 AM

## 2018-03-10 NOTE — Progress Notes (Signed)
Initial Nutrition Assessment  DOCUMENTATION CODES:   Severe malnutrition in context of chronic illness  INTERVENTION:   - Boost Breeze po TID, each supplement provides 250 kcal and 9 grams of protein  - D/C Ensure Enlive as pt does not like thick oral nutrition supplements  - Encourage PO intake  NUTRITION DIAGNOSIS:   Severe Malnutrition related to chronic illness(newly diagnosed ESRD on HD, CHF) as evidenced by moderate fat depletion, severe fat depletion, moderate muscle depletion, severe muscle depletion.  GOAL:   Patient will meet greater than or equal to 90% of their needs  MONITOR:   PO intake, Supplement acceptance, I & O's, Skin, Weight trends  REASON FOR ASSESSMENT:   Malnutrition Screening Tool    ASSESSMENT:   84 year old female with PMH significant for HTN, CKD stage IV, RA, hypothyroidism, and CHF who presented to [facility] on 03/02/18 with worsening SOB, lower extremity edema, and a 7 lb weight gain. Pt transferred to [facility] for need of hemodialysis.  Spoke with pt at bedside who reports having no appetite. Pt states "I think I'm going to eat and then I never want to." Pt states this has been going on "for a while." Pt typically eats 3 meals daily but meals are described as "light." Pt had a few bites of eggs, bacon, and some juice for breakfast this morning.  Pt had AV fistula placed 02/14/18 with plans to start HD today. Per nephrology MD, suspect pt has had progression to ESRD.  Pt does not like thick oral nutrition supplements like Ensure Enlive. Pt agreeable to trying Boost Plus. RD to order.  Pt reports her UBW as 130 lbs but that she has not weighed this recently. Per weight history in chart, pt has maintained her weight between 129-134 lbs since January 2018. Suspect weight s/p HD will be significantly lower given severe BLE edema and that pt has not been responding to diuretics and remains with fluid overload.  Meal Completion:  0-50%  Medications reviewed and include: 20 mg Pepcid daily, 1 mg folic acid daily, 75 mcg levothyroxine daily, IV antibiotics  Labs reviewed: sodium 126 (L), creatinine 5.09 (H), hemoglobin 10.1, HCT 31.2  NUTRITION - FOCUSED PHYSICAL EXAM:    Most Recent Value  Orbital Region  Moderate depletion  Upper Arm Region  Severe depletion  Thoracic and Lumbar Region  Severe depletion  Buccal Region  Moderate depletion  Temple Region  Moderate depletion  Clavicle Bone Region  Severe depletion  Clavicle and Acromion Bone Region  Severe depletion  Scapular Bone Region  Unable to assess  Dorsal Hand  Moderate depletion  Patellar Region  Severe depletion  Anterior Thigh Region  Severe depletion  Posterior Calf Region  Moderate depletion  Hair  Reviewed  Eyes  Reviewed  Mouth  Reviewed  Skin  Reviewed  Nails  Reviewed       Diet Order:  Diet Heart Room service appropriate? Yes; Fluid consistency: Thin; Fluid restriction: 2000 mL Fluid Fall precautions  EDUCATION NEEDS:   No education needs have been identified at this time  Skin:  Skin Assessment: Skin Integrity Issues: Skin Integrity Issues:: Stage II, Incisions Stage II: coccyx Incisions: closed incision to left arm  Last BM:  03/09/18  Height:   Ht Readings from Last 1 Encounters:  03/10/18 5' (1.524 m)    Weight:   Wt Readings from Last 1 Encounters:  03/10/18 134 lb 4.2 oz (60.9 kg)    Ideal Body Weight:  56.8 kg  BMI:  Body mass index is 26.22 kg/m.  Estimated Nutritional Needs:   Kcal:  1600-1800 kcal/day  Protein:  75-90 grams/day  Fluid:  2 L fluid restriction    Earma Reading, MS, RD, LDN Pager: [phone] Weekend/After Hours: [phone]

## 2018-03-10 NOTE — Consult Note (Signed)
Reason for Consult: Chronic kidney disease stage IV-V, refractory to diuretic therapy Referring Physician: Glade Lloyd M.D. 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  She was admitted to [facility] 1 week ago with progressively worsening shortness of breath, pedal edema and weight gain and was found to have decompensated diastolic heart failure and atrial fibrillation with rapid ventricular response.  At that facility, she converted spontaneously to sinus rhythm but developed worsening renal function, hyponatremia and resistance to diuretic therapy.  She was consequently transferred here yesterday for further intervention.  About 3 weeks ago (02/14/18) she underwent creation of left upper arm AVG by Dr. Darrick Penna.  Past Medical History:  Diagnosis Date  . Anemia   . CHF (congestive heart failure) (HCC)   . CKD (chronic kidney disease), stage IV (HCC)   . GERD (gastroesophageal reflux disease)   . Gout    "on daily RX" (03/09/2018)  . History of blood transfusion    "related to anemia; recently" (03/09/2018)  . Hx of pulmonary embolus    "related to BCP"  . Hypertension   . Hypothyroidism   . Pericardial effusion   . Pneumonia 02/2018  . RA (rheumatoid arthritis) (HCC)     Past Surgical History:  Procedure Laterality Date  . ABDOMINAL HYSTERECTOMY    . APPENDECTOMY    . AV FISTULA PLACEMENT Right 06/27/2009   upper arm BC vein AVF  . AV FISTULA PLACEMENT Left 02/14/2018   Procedure: INSERTION OF ARTERIOVENOUS (AV) GORE-TEX GRAFT ARM;  Surgeon: Sherren Kerns, MD;  Location: [facility] OR;  Service: Vascular;  Laterality: Left;  . CATARACT EXTRACTION W/ INTRAOCULAR LENS  IMPLANT, BILATERAL Bilateral 2012  . COLONOSCOPY    . DILATION AND CURETTAGE OF UTERUS    . FRACTURE SURGERY     . JOINT REPLACEMENT    . PARTIAL HIP ARTHROPLASTY Left 10/2017  . TOTAL HIP ARTHROPLASTY Right 1995    Family History  Problem Relation Age of Onset  . Heart disease Mother   . Heart attack Father   . Cancer Sister   . Other Sister        varicose veins  . Diabetes Brother   . Heart attack Daughter     Social History:  reports that she has never smoked. She has never used smokeless tobacco. She reports that she does not drink alcohol or use drugs.  Allergies:  Allergies  Allergen Reactions  . Tape Other (See Comments)    Tears skin - please use paper tape  . Levaquin [Levofloxacin In D5w] Other (See Comments)    Per patient this caused insomnia     Medications:  Scheduled: . acidophilus  1 capsule Oral Daily  . [START ON 03/11/2018] calcitRIOL  0.25 mcg Oral Q M,W,F  . famotidine  20 mg Oral Daily  . feeding supplement (ENSURE ENLIVE)  237 mL Oral BID BM  . folic acid  1 mg Oral Daily  . hydrALAZINE  50 mg Oral TID  . levothyroxine  75 mcg Oral QAC breakfast  . methylPREDNISolone  4 mg Oral Daily  . metoprolol tartrate  75 mg Oral BID  . sodium bicarbonate  650 mg Oral TID  . sodium chloride flush  3 mL Intravenous Q12H    BMP Latest Ref Rng & Units 03/10/2018 03/09/2018 02/14/2018  Glucose 65 - 99 mg/dL 79  99 87  BUN 6 - 20 mg/dL 340(Z) 709(U) -  Creatinine 0.44 - 1.00 mg/dL 4.38(V) 8.18(M) -  Sodium 135 - 145 mmol/L 125(L) 126(L) 139  Potassium 3.5 - 5.1 mmol/L 4.1 4.2 4.5  Chloride 101 - 111 mmol/L 86(L) 85(L) -  CO2 22 - 32 mmol/L 18(L) 22 -  Calcium 8.9 - 10.3 mg/dL 0.3(F) 5.4(H) -   CBC Latest Ref Rng & Units 03/10/2018 03/09/2018 02/14/2018  WBC 4.0 - 10.5 K/uL 24.9(H) 25.6(H) -  Hemoglobin 12.0 - 15.0 g/dL 6.0(O) 10.1(L) 9.5(L)  Hematocrit 36.0 - 46.0 % 29.3(L) 31.2(L) 28.0(L)  Platelets 150 - 400 K/uL 258 258 -     No results found.  Review of Systems  Constitutional: Positive for malaise/fatigue. Negative for chills and fever.  HENT: Negative.    Eyes: Negative.   Respiratory: Positive for shortness of breath. Negative for cough and hemoptysis.   Cardiovascular: Positive for leg swelling. Negative for chest pain and orthopnea.  Gastrointestinal: Positive for diarrhea. Negative for heartburn, nausea and vomiting.  Genitourinary: Negative.   Musculoskeletal: Negative.   Skin: Negative.   Neurological: Positive for weakness.   Blood pressure (!) 181/84, pulse 68, temperature (!) 97.5 F (36.4 C), temperature source Oral, resp. rate 15, height 5' (1.524 m), weight 60.9 kg (134 lb 4.2 oz), SpO2 95 %. Physical Exam  Nursing note and vitals reviewed. Constitutional: She is oriented to person, place, and time. She appears well-developed.  Appears fatigued resting in recliner  HENT:  Head: Normocephalic and atraumatic.  Mouth/Throat: Oropharynx is clear and moist.  Eyes: Pupils are equal, round, and reactive to light. No scleral icterus.  Neck: Normal range of motion. Neck supple. No JVD present.  Cardiovascular: Normal rate and regular rhythm.  No murmur heard. Respiratory: Effort normal. She has rales.  Fine rales right base  GI: Soft. Bowel sounds are normal. There is no tenderness. There is no rebound and no guarding.  Musculoskeletal: She exhibits edema.  2+ ankle edema bilaterally, left upper arm AVG with palpable thrill and audible bruit (palpable medially over basilic vein location)  Neurological: She is alert and oriented to person, place, and time.  Skin: Skin is warm and dry. No rash noted.    Assessment/Plan: 1.  Chronic kidney disease stage IV-V with diuretic resistance-suspect that she has had progression now to end-stage renal disease given recent hemodynamic perturbations surrounding her recent hospitalization with atrial fibrillation/RVR and CHF exacerbation.  We will start her on hemodialysis at this time and begin the process for outpatient dialysis unit placement.  On clinical exam, her left upper arm AVG appears  to have incorporated well with well-healed incisions and resolution of postoperative edema. 2.  Acute exacerbation of diastolic heart failure: Resistant to diuretic therapy prompting transfer over to: Hospital.  Will begin hemodialysis today for ultrafiltration/clearance. 3.  Hypertension: Significantly elevated blood pressures noted possibly from volume overload-monitor with ultrafiltration/hemodialysis. 4.  Hyponatremia: Secondary to CHF exacerbation as well as declining renal function and impaired free water handling with diuretic resistance-monitor with hemodialysis and continue to restrict fluid intake. 5.  Anemia of chronic kidney disease: Check iron studies today to decide on need for supplementation versus initiation of ESA therapy. 6.  Secondary hyperparathyroidism: We will check PTH level and follow phosphorus to decide on need for binders/vitamin D receptor analog. 7.  Diarrhea: She has had some diarrhea overnight, I will check a C. difficile assay and if negative, treat with Imodium.  Uyen Eichholz K. 03/10/2018, 11:19 AM

## 2018-03-10 NOTE — Progress Notes (Signed)
Patient given Iv lasix with little resulted and patient does not feel like she has to go. On bladder scan patient had 230cc. Will recheck patient and encourage to void.

## 2018-03-10 NOTE — Progress Notes (Signed)
Pharmacy Antibiotic Note  Rachel Johns is a 84 y.o. female admitted on 03/09/2018 after admission at [facility] with leukocytosis c/w infection of unknown etiology.  Pharmacy has been consulted for cefepime dosing.  Pt had initially been tx'd w/ Rocephin and Zithromax, eventually changed to cefepime w/ last dose 4/10 at 0846.  Plan: Continue cefepime 1g IV Q24H.  Height: 5' (152.4 cm) Weight: 134 lb 4.2 oz (60.9 kg) IBW/kg (Calculated) : 45.5  Temp (24hrs), Avg:97.6 F (36.4 C), Min:97.4 F (36.3 C), Max:97.7 F (36.5 C)  Recent Labs  Lab 03/09/18 2259  WBC 25.6*  CREATININE 5.09*    Estimated Creatinine Clearance: 7 mL/min (A) (by C-G formula based on SCr of 5.09 mg/dL (H)).    Allergies  Allergen Reactions  . Tape Other (See Comments)    Tears skin - please use paper tape  . Levaquin [Levofloxacin In D5w] Other (See Comments)    Per patient this caused insomnia      Thank you for allowing pharmacy to be a part of this patient's care.  Vernard Gambles, PharmD, BCPS  03/10/2018 7:07 AM

## 2018-03-10 NOTE — Consult Note (Addendum)
Cardiology Consultation:   Patient ID: Rachel Johns; [id number]; [birth year]   Admit date: 03/09/2018 Date of Consult: 03/10/2018  Primary Care Provider: Street, Rachel Coup, MD Primary Cardiologist: Rachel Rachel Johns   Patient Profile:   Rachel Johns is a 84 y.o. female with a hx of ESRD who is being seen today for the evaluation of PAF and CHF at the request of Rachel Rachel Johns.  History of Present Illness:   Rachel Johns is an 84 y/o female, seen by Rachel Johns in the past when she was hospitalized in Jan 2018 with CAP and noted to have a pericardial effusion on echo. She was stable so did not require pericardiocentesis. Rachel Johns was Feb 2018 with Rachel Johns, she saw Rachel Rachel Johns in May 2018. She is seen now as a transfer from [facility] where she presented 03/02/18 with CHF. PAF was noted during that admission as well as acute on chronic renal insufficiency. She is transfered now to start HD.   Past Medical History:  Diagnosis Date  . Anemia   . CHF (congestive heart failure) (HCC)   . CKD (chronic kidney disease), stage IV (HCC)   . GERD (gastroesophageal reflux disease)   . Gout    "on daily RX" (03/09/2018)  . History of blood transfusion    "related to anemia; recently" (03/09/2018)  . Hx of pulmonary embolus    "related to BCP"  . Hypertension   . Hypothyroidism   . Pericardial effusion   . Pneumonia 02/2018  . RA (rheumatoid arthritis) (HCC)     Past Surgical History:  Procedure Laterality Date  . ABDOMINAL HYSTERECTOMY    . APPENDECTOMY    . AV FISTULA PLACEMENT Right 06/27/2009   upper arm BC vein AVF  . AV FISTULA PLACEMENT Left 02/14/2018   Procedure: INSERTION OF ARTERIOVENOUS (AV) GORE-TEX GRAFT ARM;  Surgeon: Sherren Kerns, MD;  Location: [facility] OR;  Service: Vascular;  Laterality: Left;  . CATARACT EXTRACTION W/ INTRAOCULAR LENS  IMPLANT, BILATERAL Bilateral 2012  . COLONOSCOPY    . DILATION AND CURETTAGE OF UTERUS    . FRACTURE SURGERY    . JOINT REPLACEMENT    . PARTIAL HIP  ARTHROPLASTY Left 10/2017  . TOTAL HIP ARTHROPLASTY Right 1995     Home Medications:  Prior to Admission medications   Medication Sig Start Date End Date Taking? Authorizing Provider  acetaminophen (TYLENOL) 325 MG tablet Take 325-650 mg by mouth 2 (two) times daily as needed for moderate pain or headache.    Yes [provider]  allopurinol (ZYLOPRIM) 100 MG tablet Take 100 mg by mouth daily.  10/16/12  Yes [provider]  calcitRIOL (ROCALTROL) 0.25 MCG capsule Take 0.25 mcg by mouth every Monday, Wednesday, and Friday.  10/16/12  Yes [provider]  cholecalciferol (VITAMIN D) 1000 UNITS tablet Take 2,000 Units by mouth daily.    Yes [provider]  cloNIDine (CATAPRES) 0.2 MG tablet Take 0.2 mg by mouth 2 (two) times daily with a meal.  09/10/12  Yes [provider]  doxylamine, Sleep, (UNISOM) 25 MG tablet Take 25 mg by mouth at bedtime as needed for sleep.   Yes [provider]  Epoetin Alfa (PROCRIT IJ) Inject 1 each as directed as needed (for low hemoglobin).    Yes [provider]  folic acid (FOLVITE) 1 MG tablet Take 1 mg by mouth daily.   Yes [provider]  furosemide (LASIX) 20 MG tablet Take 20 mg by mouth daily.  Yes [provider]  Lactobacillus Rhamnosus, GG, (CULTURELLE PO) Take 1 capsule by mouth daily.   Yes [provider]  levothyroxine (SYNTHROID, LEVOTHROID) 75 MCG tablet Take 75 mcg by mouth daily before breakfast.  09/28/12  Yes [provider]  ranitidine (ZANTAC) 150 MG tablet Take 150 mg by mouth 2 (two) times daily.    Yes [provider]  sodium bicarbonate 650 MG tablet Take 650 mg by mouth 3 (three) times daily.    Yes [provider]  amLODipine (NORVASC) 10 MG tablet Take 1 tablet (10 mg total) by mouth daily. Patient not taking: Reported on 01/27/2018 01/07/17   Chilton Si, MD  methylPREDNISolone (MEDROL) 4 MG tablet Take 1 tablet  (4 mg total) by mouth daily. Continuous course for arthritis pain. HOLD WHILE YOU ARE ON HIGHER DOSE PREDNISONE AND TAPER IS COMPLETED. Patient not taking: Reported on 03/10/2018 12/09/16   Rai, Delene Ruffini, MD  traMADol (ULTRAM) 50 MG tablet Take 1 tablet (50 mg total) by mouth every 6 (six) hours as needed for up to 10 doses. Patient not taking: Reported on 03/10/2018 02/14/18   Emilie Rutter, PA-C    Inpatient Medications: Scheduled Meds: . acidophilus  1 capsule Oral Daily  . [START ON 03/11/2018] calcitRIOL  0.25 mcg Oral Q M,W,F  . famotidine  20 mg Oral Daily  . feeding supplement  1 Container Oral TID BM  . folic acid  1 mg Oral Daily  . hydrALAZINE  50 mg Oral TID  . levothyroxine  75 mcg Oral QAC breakfast  . methylPREDNISolone  4 mg Oral Daily  . metoprolol tartrate  75 mg Oral BID  . sodium bicarbonate  650 mg Oral TID  . sodium chloride flush  3 mL Intravenous Q12H   Continuous Infusions: . sodium chloride    . ceFEPime (MAXIPIME) IV Stopped (03/10/18 9458)  . furosemide Stopped (03/10/18 1201)   PRN Meds: sodium chloride, acetaminophen, ondansetron (ZOFRAN) IV, sodium chloride flush  Allergies:    Allergies  Allergen Reactions  . Tape Other (See Comments)    Tears skin - please use paper tape  . Levaquin [Levofloxacin In D5w] Other (See Comments)    Per patient this caused insomnia     Social History:   Social History   Socioeconomic History  . Marital status: Widowed    Spouse name: Not on file  . Number of children: Not on file  . Years of education: Not on file  . Highest education level: Not on file  Occupational History  . Not on file  Social Needs  . Financial resource strain: Not on file  . Food insecurity:    Worry: Not on file    Inability: Not on file  . Transportation needs:    Medical: Not on file    Non-medical: Not on file  Tobacco Use  . Smoking status: Never Smoker  . Smokeless tobacco: Never Used  Substance and Sexual Activity  .  Alcohol use: No  . Drug use: Never  . Sexual activity: Not Currently  Lifestyle  . Physical activity:    Days per week: Not on file    Minutes per session: Not on file  . Stress: Not on file  Relationships  . Social connections:    Talks on phone: Not on file    Gets together: Not on file    Attends religious service: Not on file    Active member of club or organization: Not on file  Attends meetings of clubs or organizations: Not on file    Relationship status: Not on file  . Intimate partner violence:    Fear of current or ex partner: Not on file    Emotionally abused: Not on file    Physically abused: Not on file    Forced sexual activity: Not on file  Other Topics Concern  . Not on file  Social History Narrative  . Not on file    Family History:    Family History  Problem Relation Age of Onset  . Heart disease Mother   . Heart attack Father   . Cancer Sister   . Other Sister        varicose veins  . Diabetes Brother   . Heart attack Daughter      ROS:  Please see the history of present illness.  All other ROS reviewed and negative.     Physical Exam/Data:   Vitals:   03/10/18 0059 03/10/18 0420 03/10/18 0530 03/10/18 0928  BP:   (!) 164/72 (!) 181/84  Pulse:   65 68  Resp:   16 15  Temp:   (!) 97.4 F (36.3 C) (!) 97.5 F (36.4 C)  TempSrc:   Oral Oral  SpO2:   95% 95%  Weight:  134 lb 4.2 oz (60.9 kg)    Height: 5' (1.524 m)       Intake/Output Summary (Last 24 hours) at 03/10/2018 1433 Last data filed at 03/10/2018 1104 Gross per 24 hour  Intake 360 ml  Output 0 ml  Net 360 ml   Filed Weights   03/09/18 2045 03/10/18 0420  Weight: 134 lb 4.2 oz (60.9 kg) 134 lb 4.2 oz (60.9 kg)   Body mass index is 26.22 kg/m.  General:  Pale elderly female NAD HEENT: normal Lymph: no adenopathy Neck: no JVD Endocrine:  No thryomegaly Vascular: No carotid bruits; FA pulses 2+ bilaterally without bruits  Cardiac:  normal S1, S2; RRR; no murmur    Lungs:  clear to auscultation bilaterally, no wheezing, rhonchi or rales  Abd: soft, nontender, no hepatomegaly  Ext: no edema Musculoskeletal:  No deformities, BUE and BLE strength normal and equal Skin: warm and dry  Neuro:  CNs 2-12 intact, no focal abnormalities noted Psych:  She is confused- "my mom needs dialysis"   EKG:  The EKG was personally reviewed and demonstrates:  NSR 69 Telemetry:  Telemetry was personally reviewed and demonstrates:  NSR  Relevant CV Studies: See echo done at [facility] 03/03/18  Laboratory Data:  Chemistry Recent Labs  Lab 03/09/18 2259 03/10/18 0850  NA 126* 125*  K 4.2 4.1  CL 85* 86*  CO2 22 18*  GLUCOSE 99 79  BUN 103* 106*  CREATININE 5.09* 5.09*  CALCIUM 8.5* 8.5*  GFRNONAA 7* 7*  GFRAA 8* 8*  ANIONGAP 19* 21*    Recent Labs  Lab 03/09/18 2259 03/10/18 0850  PROT 5.3*  --   ALBUMIN 2.3* 2.2*  AST 22  --   ALT 81*  --   ALKPHOS 206*  --   BILITOT 0.6  --    Hematology Recent Labs  Lab 03/09/18 2259 03/10/18 0850  WBC 25.6* 24.9*  RBC 3.76* 3.51*  HGB 10.1* 9.4*  HCT 31.2* 29.3*  MCV 83.0 83.5  MCH 26.9 26.8  MCHC 32.4 32.1  RDW 17.3* 17.6*  PLT 258 258   Cardiac EnzymesNo results for input(s): TROPONINI in the last 168 hours. No results for input(s): TROPIPOC  in the last 168 hours.  BNPNo results for input(s): BNP, PROBNP in the last 168 hours.  DDimer No results for input(s): DDIMER in the last 168 hours.  Radiology/Studies:  See records from [facility]  Assessment and Plan:   Acute CHF Secondary to renal failure  PAF NSR now- will discuss anticoagulation with MD-her INR is 2.87, she was on Coumadin-started at [facility]. TSH WNL at [facility].   H/O pericardial effusion Jan 2018 Not hemodynamically significant then. Small pericardial effusion at [facility]  Plan:  She needs dialysis. Continue Lopressor -75 mg BID. We will follow and adjust as needed. Echo at [facility] showed her EF to be 40-45%.    For questions or updates,  please contact [facility] Please consult [email or web address] for contact info under Cardiology/STEMI.   Signed, Corine Shelter, PA-C  03/10/2018 2:33 PM    I have seen, examined and evaluated the patient this PM along with Mr. Diona Fanti, [place].  After reviewing all the available data and chart, we discussed the patients laboratory, study & physical findings as well as symptoms in detail. I agree with his findings, examination as well as impression recommendations as per our discussion.    Ms. Broussard is a very unfortunate elderly woman who is "nearing her wits" she is extremely fatigued and tired.  She apparently is not to be dialyzed today.  She has some edema but is no longer necessarily dyspneic.  Unfortunately she has now gone back into A. fib RVR.  She is currently on 75 twice daily of metoprolol I will give her 1 dose of additional 5 mg IV to slow her down.  I will tell the nighttime staff to be on the look out for tachycardia. I suspect that when she is dialyzed, or some of his symptoms will improve.  As for anticoagulation, for now but we will continue with warfarin since that was started.  We can discuss further options down the road.  As long as she is therapeutic, if necessary we could consider cardioversion, therefore I would prefer to not adjust her anticoagulation at this time.  Review of her echocardiogram from [facility] indicated that her EF is slightly reduced which is probably related to her A. fib and need for dialysis with with biatrial enlargement.  Thankfully the pericardial effusion has reduced.  She her pulmonary arterial pressure has seems to have increased and that is probably also related to pulmonary venous congestion from A. fib and diastolic dysfunction. I agree with the plan here is for her to undergo dialysis.  We will adjust medications for controlling her heart rate, and will further discuss discuss her anticoagulation in the future, but for now the main purpose of her  being here is dialysis.    Bryan Lemma, M.D., M.S. Interventional Cardiologist   Pager # [phone] Phone # [phone] [address] [place], [place] [address]

## 2018-03-11 ENCOUNTER — Inpatient Hospital Stay (HOSPITAL_COMMUNITY): Payer: Medicare Other

## 2018-03-11 DIAGNOSIS — E8779 Other fluid overload: Secondary | ICD-10-CM

## 2018-03-11 DIAGNOSIS — I5041 Acute combined systolic (congestive) and diastolic (congestive) heart failure: Secondary | ICD-10-CM

## 2018-03-11 LAB — CBC WITH DIFFERENTIAL/PLATELET
Basophils Absolute: 0 10*3/uL (ref 0.0–0.1)
Basophils Relative: 0 %
Eosinophils Absolute: 0 10*3/uL (ref 0.0–0.7)
Eosinophils Relative: 0 %
HCT: 30.6 % — ABNORMAL LOW (ref 36.0–46.0)
Hemoglobin: 9.9 g/dL — ABNORMAL LOW (ref 12.0–15.0)
Lymphocytes Relative: 4 %
Lymphs Abs: 1.1 10*3/uL (ref 0.7–4.0)
MCH: 26.5 pg (ref 26.0–34.0)
MCHC: 32.4 g/dL (ref 30.0–36.0)
MCV: 81.8 fL (ref 78.0–100.0)
Monocytes Absolute: 1.4 10*3/uL — ABNORMAL HIGH (ref 0.1–1.0)
Monocytes Relative: 5 %
Neutro Abs: 25.9 10*3/uL — ABNORMAL HIGH (ref 1.7–7.7)
Neutrophils Relative %: 91 %
Platelets: 291 10*3/uL (ref 150–400)
RBC: 3.74 MIL/uL — ABNORMAL LOW (ref 3.87–5.11)
RDW: 17.2 % — ABNORMAL HIGH (ref 11.5–15.5)
Smear Review: ADEQUATE
WBC: 28.4 10*3/uL — ABNORMAL HIGH (ref 4.0–10.5)

## 2018-03-11 LAB — RENAL FUNCTION PANEL
ALBUMIN: 2.1 g/dL — AB (ref 3.5–5.0)
Anion gap: 22 — ABNORMAL HIGH (ref 5–15)
BUN: 124 mg/dL — ABNORMAL HIGH (ref 6–20)
CALCIUM: 8.3 mg/dL — AB (ref 8.9–10.3)
CO2: 19 mmol/L — ABNORMAL LOW (ref 22–32)
CREATININE: 5.08 mg/dL — AB (ref 0.44–1.00)
Chloride: 84 mmol/L — ABNORMAL LOW (ref 101–111)
GFR calc Af Amer: 8 mL/min — ABNORMAL LOW (ref >60)
GFR calc non Af Amer: 7 mL/min — ABNORMAL LOW (ref >60)
GLUCOSE: 160 mg/dL — AB (ref 65–99)
PHOSPHORUS: 8.4 mg/dL — AB (ref 2.5–4.6)
Potassium: 3.5 mmol/L (ref 3.5–5.1)
SODIUM: 125 mmol/L — AB (ref 135–145)

## 2018-03-11 LAB — CBC
HCT: 30.1 % — ABNORMAL LOW (ref 36.0–46.0)
Hemoglobin: 9.8 g/dL — ABNORMAL LOW (ref 12.0–15.0)
MCH: 26.6 pg (ref 26.0–34.0)
MCHC: 32.6 g/dL (ref 30.0–36.0)
MCV: 81.8 fL (ref 78.0–100.0)
PLATELETS: 262 10*3/uL (ref 150–400)
RBC: 3.68 MIL/uL — ABNORMAL LOW (ref 3.87–5.11)
RDW: 17.1 % — AB (ref 11.5–15.5)
WBC: 25.8 10*3/uL — ABNORMAL HIGH (ref 4.0–10.5)

## 2018-03-11 LAB — BASIC METABOLIC PANEL WITH GFR
Anion gap: 23 — ABNORMAL HIGH (ref 5–15)
BUN: 119 mg/dL — ABNORMAL HIGH (ref 6–20)
CO2: 17 mmol/L — ABNORMAL LOW (ref 22–32)
Calcium: 8.4 mg/dL — ABNORMAL LOW (ref 8.9–10.3)
Chloride: 85 mmol/L — ABNORMAL LOW (ref 101–111)
Creatinine, Ser: 5.13 mg/dL — ABNORMAL HIGH (ref 0.44–1.00)
GFR calc Af Amer: 8 mL/min — ABNORMAL LOW
GFR calc non Af Amer: 7 mL/min — ABNORMAL LOW
Glucose, Bld: 137 mg/dL — ABNORMAL HIGH (ref 65–99)
Potassium: 3.6 mmol/L (ref 3.5–5.1)
Sodium: 125 mmol/L — ABNORMAL LOW (ref 135–145)

## 2018-03-11 LAB — PROTIME-INR
INR: 2.47
Prothrombin Time: 26.5 seconds — ABNORMAL HIGH (ref 11.4–15.2)

## 2018-03-11 LAB — MAGNESIUM: Magnesium: 1.9 mg/dL (ref 1.7–2.4)

## 2018-03-11 MED ORDER — HEPARIN SODIUM (PORCINE) 1000 UNIT/ML DIALYSIS: 1000.0000 [IU] | INTRAMUSCULAR | Status: DC | PRN

## 2018-03-11 MED ORDER — LIDOCAINE HCL (PF) 1 % IJ SOLN: 5.0000 mL | INTRAMUSCULAR | Status: DC | PRN

## 2018-03-11 MED ORDER — PENTAFLUOROPROP-TETRAFLUOROETH EX AERO: 1.0000 "application " | INHALATION_SPRAY | CUTANEOUS | Status: DC | PRN

## 2018-03-11 MED ORDER — WARFARIN SODIUM 2.5 MG PO TABS
2.5000 mg | ORAL_TABLET | Freq: Once | ORAL | Status: AC
  Administered 2018-03-11: 2.5 mg via ORAL
  Filled 2018-03-11: qty 1

## 2018-03-11 MED ORDER — SODIUM CHLORIDE 0.9 % IV SOLN: 100.0000 mL | INTRAVENOUS | Status: DC | PRN

## 2018-03-11 MED ORDER — HEPARIN SODIUM (PORCINE) 1000 UNIT/ML DIALYSIS: 40.0000 [IU]/kg | INTRAMUSCULAR | Status: DC | PRN

## 2018-03-11 MED ORDER — HYDRALAZINE HCL 20 MG/ML IJ SOLN
10.0000 mg | Freq: Four times a day (QID) | INTRAMUSCULAR | Status: DC | PRN
  Administered 2018-03-11: 10 mg via INTRAVENOUS
  Filled 2018-03-11: qty 1

## 2018-03-11 MED ORDER — LIDOCAINE-PRILOCAINE 2.5-2.5 % EX CREA: 1.0000 "application " | TOPICAL_CREAM | CUTANEOUS | Status: DC | PRN

## 2018-03-11 MED ORDER — WARFARIN - PHARMACIST DOSING INPATIENT
Freq: Every day | Status: DC
  Administered 2018-03-16: 18:00:00

## 2018-03-11 MED ORDER — ALTEPLASE 2 MG IJ SOLR: 2.0000 mg | Freq: Once | INTRAMUSCULAR | Status: DC | PRN

## 2018-03-11 NOTE — Procedures (Signed)
Patient was seen on dialysis and the procedure was supervised.  BFR 200  Via AVGF BP is  133/72.   Patient appears to be tolerating treatment well- is nervous regarding it being first treatment- was accidentally stuck with 15 gauge needles but no issues   Skyla Champagne A 03/11/2018

## 2018-03-11 NOTE — Progress Notes (Signed)
Patient ID: Rachel Johns, female   DOB: Sep 19, 1934, 84 y.o.   MRN: [id number]  PROGRESS NOTE    Rachel Johns  FWY:[id number] DOB: [birth year] DOA: 03/09/2018 PCP: Casper Harrison Stephanie Coup, MD   Brief Narrative:  84 year old female with history of hypertension, CKD stage IV, RA, hypothyroidism, CHF with last EF of 40-45% presented with worsening shortness of breath, worsening lower extremity edema and 7 pound weight gain to [facility] on 03/02/2018.  She was found to be in atrial fibrillation with rapid ventricular rate and probably decompensated diastolic heart failure.  She was started on intravenous Lasix, intravenous Cardizem and IV heparin.  She spontaneously converted to sinus rhythm however further hospital course was complicated with worsening renal function, persistent hyponatremia and leukocytosis.  Patient was transferred to [facility] on 03/09/2018 per nephrology request for need for probable initiation of dialysis.  Cardiology was also consulted.   Assessment & Plan:   Principal Problem:   Acute kidney injury superimposed on chronic kidney disease (HCC) Active Problems:   Rheumatoid arthritis (HCC)   Normocytic anemia   Leukocytosis   Hyponatremia   Fluid overload   Pressure injury of skin   AF (paroxysmal atrial fibrillation) (HCC)   Acute decompensated heart failure (HCC)   PFO (patent foramen ovale)   Acute kidney injury on chronic kidney disease stage IV -Nephrology following.  Patient might be started on dialysis today. -Creatinine 5.13 today.  Intravenous Lasix not working.  Patient is having very minimal urine output.  Will discontinue IV Lasix once dialysis has been started.   Paroxysmal atrial fibrillation with rapid ventricular response -Initially treated with Cardizem drip -Currently intermittently tachycardic. -Cardiology following.  Continue metoprolol. -Resume Coumadin.  Coumadin will be dosed as per pharmacy depending on INR.  INR  therapeutic  Persistent leukocytosis -Probably reactive from above along with steroid use. -Patient was initially started on Rocephin and Zithromax, however leukocytosis continue to worsen with negative blood cultures.  CT of the chest and abdomen did not show any obvious infection.  Antibiotics were changed to cefepime at outside hospital. -Dr. Toula Moos with Hematology-Dr. Ferol Luz reviewed peripheral smear-no evidence of immature cells/chronic leukemia-per Hematology-peripheral smear consistent with a infectious process.  -Patient still had leukocytosis yesterday.  No WBC available today.  Will repeat a.m. CBC.  Continue cefepime for now.  Hopefully white count improves after initiation of dialysis.  Acute on chronic systolic and diastolic heart failure -Still appears to be fluid overloaded.  Monitor input and output.  Daily weights. -Continue beta-blocker, hydralazine. - Cardiology following -Echo on 03/03/2018 showed some reduction in EF to 40-45% and severe tricuspid regurgitation with large PFO.  Hyponatremia -Probably second to volume overload -Repeat a.m. labs.  Follow nephrology evaluation  Hyperkalemia -Resolved.  Repeat a.m. labs today's labs.  Hypothyroidism -Continue Synthroid  Essential hypertension -Monitor blood pressure.  Continue metoprolol and hydralazine.  Rheumatoid arthritis -Steroid dependent.  Continue methylprednisone.  Anemia:  - Likely secondary to chronic kidney disease-follows with Dr. Julaine Hua panel consistent with anemia of chronic kidney disease-spoke with Dr. Daivd Council 20000 units of Procrit 4/9, and also transfused 1 units of PRBC as well. Hemoglobin is currently stable-continue to follow.  Gout:  - No evidence of flare-Continue allopurinol    DVT prophylaxis: Resume Coumadin Code Status: Full Family Communication: None at bedside Disposition Plan: Depends on clinical outcome  Consultants: Nephrology/cardiology  Procedures:  None  Antimicrobials: Cefepime   Subjective: Patient seen and examined at bedside.  She does not feel  well.  She feels that she almost died last night.  She is slightly confused.  Feels very short of breath. Objective: Vitals:   03/10/18 2026 03/11/18 0500 03/11/18 0645 03/11/18 0936  BP: 128/78  (!) 160/127 (!) 149/56  Pulse: (!) 104  90 80  Resp: 16  16 16   Temp: 97.8 F (36.6 C)  98 F (36.7 C)   TempSrc: Oral     SpO2: 94%  95% 95%  Weight: 60.9 kg (134 lb 4.2 oz) 60.9 kg (134 lb 4.2 oz)    Height:        Intake/Output Summary (Last 24 hours) at 03/11/2018 1153 Last data filed at 03/11/2018 0934 Gross per 24 hour  Intake 1038 ml  Output 100 ml  Net 938 ml   Filed Weights   03/10/18 0420 03/10/18 2026 03/11/18 0500  Weight: 60.9 kg (134 lb 4.2 oz) 60.9 kg (134 lb 4.2 oz) 60.9 kg (134 lb 4.2 oz)    Examination:  General exam: Elderly female, looks ill.  Awake but slightly confused Respiratory system: Bilateral decreased breath sounds at bases with basilar crackles Cardiovascular system: S1 & S2 heard, tachycardic gastrointestinal system: Abdomen is nondistended, soft and nontender. Normal bowel sounds heard. Central nervous system: Awake but slightly confused no focal neurological deficits. Moving extremities Extremities: No cyanosis, clubbing; 1-2+ edema Skin: No rashes, lesions or ulcers Psychiatry: Cannot be assessed because of confusion    Data Reviewed: I have personally reviewed following labs and imaging studies  CBC: Recent Labs  Lab 03/09/18 2259 03/10/18 0850  WBC 25.6* 24.9*  NEUTROABS 23.5* 21.9*  HGB 10.1* 9.4*  HCT 31.2* 29.3*  MCV 83.0 83.5  PLT 258 258   Basic Metabolic Panel: Recent Labs  Lab 03/09/18 2259 03/10/18 0850 03/11/18 1000  NA 126* 125* 125*  K 4.2 4.1 3.6  CL 85* 86* 85*  CO2 22 18* 17*  GLUCOSE 99 79 137*  BUN 103* 106* 119*  CREATININE 5.09* 5.09* 5.13*  CALCIUM 8.5* 8.5* 8.4*  MG 1.9  --  1.9  PHOS  --  8.8*   --    GFR: Estimated Creatinine Clearance: 6.9 mL/min (A) (by C-G formula based on SCr of 5.13 mg/dL (H)). Liver Function Tests: Recent Labs  Lab 03/09/18 2259 03/10/18 0850  AST 22  --   ALT 81*  --   ALKPHOS 206*  --   BILITOT 0.6  --   PROT 5.3*  --   ALBUMIN 2.3* 2.2*   No results for input(s): LIPASE, AMYLASE in the last 168 hours. No results for input(s): AMMONIA in the last 168 hours. Coagulation Profile: Recent Labs  Lab 03/09/18 2259 03/10/18 0850 03/11/18 1000  INR 3.15 2.87 2.47   Cardiac Enzymes: No results for input(s): CKTOTAL, CKMB, CKMBINDEX, TROPONINI in the last 168 hours. BNP (last 3 results) No results for input(s): PROBNP in the last 8760 hours. HbA1C: No results for input(s): HGBA1C in the last 72 hours. CBG: No results for input(s): GLUCAP in the last 168 hours. Lipid Profile: No results for input(s): CHOL, HDL, LDLCALC, TRIG, CHOLHDL, LDLDIRECT in the last 72 hours. Thyroid Function Tests: No results for input(s): TSH, T4TOTAL, FREET4, T3FREE, THYROIDAB in the last 72 hours. Anemia Panel: No results for input(s): VITAMINB12, FOLATE, FERRITIN, TIBC, IRON, RETICCTPCT in the last 72 hours. Sepsis Labs: No results for input(s): PROCALCITON, LATICACIDVEN in the last 168 hours.  Recent Results (from the past 240 hour(s))  MRSA PCR Screening  Status: None   Collection Time: 03/09/18 11:02 PM  Result Value Ref Range Status   MRSA by PCR NEGATIVE NEGATIVE Final    Comment:        The GeneXpert MRSA Assay (FDA approved for NASAL specimens only), is one component of a comprehensive MRSA colonization surveillance program. It is not intended to diagnose MRSA infection nor to guide or monitor treatment for MRSA infections. Performed at [facility], [address], [place], [place] [address]          Radiology Studies: Dg Chest 2 View  Result Date: 03/11/2018 CLINICAL DATA:  Weakness, shortness of Breath EXAM: CHEST - 2 VIEW  COMPARISON:  03/03/2018 FINDINGS: Cardiomegaly. Layering bilateral effusions. Vascular congestion and bilateral perihilar and lower lobe airspace opacities, favor edema/CHF, worsening since prior study. Large hiatal hernia. No acute bony abnormality. IMPRESSION: Cardiomegaly. Worsening perihilar and lower lobe airspace opacities, favor edema. Layering bilateral effusions. Electronically Signed   By: Charlett Nose M.D.   On: 03/11/2018 09:25        Scheduled Meds: . acidophilus  1 capsule Oral Daily  . calcitRIOL  0.25 mcg Oral Q M,W,F  . famotidine  20 mg Oral Daily  . feeding supplement  1 Container Oral TID BM  . folic acid  1 mg Oral Daily  . hydrALAZINE  50 mg Oral TID  . levothyroxine  75 mcg Oral QAC breakfast  . methylPREDNISolone  4 mg Oral Daily  . metoprolol tartrate  75 mg Oral BID  . sodium bicarbonate  650 mg Oral TID  . sodium chloride flush  3 mL Intravenous Q12H   Continuous Infusions: . sodium chloride    . ceFEPime (MAXIPIME) IV Stopped (03/11/18 1012)  . furosemide Stopped (03/11/18 1142)     LOS: 2 days        Glade Lloyd, MD [facility] Pager [phone]  If 7PM-7AM, please contact night-coverage [email or web address] Password [facility] 03/11/2018, 11:53 AM

## 2018-03-11 NOTE — Progress Notes (Signed)
Patient ID: Rachel Johns, female   DOB: 01-20-1934, 84 y.o.   MRN: [id number] [facility] Progress Note   Assessment/ Plan:   1.  Chronic kidney disease stage IV-V with progression to ESRD-begin hemodialysis today (patient wanted this deferred yesterday).  She remains with volume excess and marginal/poor response to diuretics overnight.  Process initiated for outpatient dialysis unit placement. 2.  Acute exacerbation of diastolic heart failure: Transferred for initiation of hemodialysis after refractoriness to diuretic therapy with worsening renal function/hyponatremia 3.  Hypertension: Blood pressure improving overnight-monitor with ultrafiltration/HD. 4.  Hyponatremia: Secondary to CHF exacerbation as well as declining renal function and impaired free water handling with diuretic resistance-monitor with hemodialysis and continue to restrict fluid intake. 5.  Anemia of chronic kidney disease: Check iron studies today with hemodialysis to decide on need for supplementation versus initiation of ESA therapy. 6.  Secondary hyperparathyroidism: We will check PTH level with hemodialysis and follow phosphorus to decide on need for binders/vitamin D receptor analog.  Subjective:   She reports an uncomfortable night-had episode of atrial fibrillation with rapid ventricular response overnight that likely contributed to that. No further diarrhea.   Objective:   BP (!) 149/56 (BP Location: Right Leg)   Pulse 80   Temp 98 F (36.7 C)   Resp 16   Ht 5' (1.524 m)   Wt 60.9 kg (134 lb 4.2 oz)   SpO2 95%   BMI 26.22 kg/m   Intake/Output Summary (Last 24 hours) at 03/11/2018 1146 Last data filed at 03/11/2018 0934 Gross per 24 hour  Intake 1038 ml  Output 100 ml  Net 938 ml   Weight change: 0 kg (0 lb)  Physical Exam: Gen: Comfortably resting in bed, awakens to calling out her name CVS: Irregularly irregular, normal rate, S1 and S2 normal Resp: Fine rales right base otherwise clear  to auscultation, no rhonchi. Abd: Soft, flat, nontender Ext: 2+ ankle edema bilaterally.  Left upper arm AVG with thrill  Imaging: Dg Chest 2 View  Result Date: 03/11/2018 CLINICAL DATA:  Weakness, shortness of Breath EXAM: CHEST - 2 VIEW COMPARISON:  03/03/2018 FINDINGS: Cardiomegaly. Layering bilateral effusions. Vascular congestion and bilateral perihilar and lower lobe airspace opacities, favor edema/CHF, worsening since prior study. Large hiatal hernia. No acute bony abnormality. IMPRESSION: Cardiomegaly. Worsening perihilar and lower lobe airspace opacities, favor edema. Layering bilateral effusions. Electronically Signed   By: Charlett Nose M.D.   On: 03/11/2018 09:25    Labs: BMET Recent Labs  Lab 03/09/18 2259 03/10/18 0850 03/11/18 1000  NA 126* 125* 125*  K 4.2 4.1 3.6  CL 85* 86* 85*  CO2 22 18* 17*  GLUCOSE 99 79 137*  BUN 103* 106* 119*  CREATININE 5.09* 5.09* 5.13*  CALCIUM 8.5* 8.5* 8.4*  PHOS  --  8.8*  --    CBC Recent Labs  Lab 03/09/18 2259 03/10/18 0850  WBC 25.6* 24.9*  NEUTROABS 23.5* 21.9*  HGB 10.1* 9.4*  HCT 31.2* 29.3*  MCV 83.0 83.5  PLT 258 258    Medications:    . acidophilus  1 capsule Oral Daily  . calcitRIOL  0.25 mcg Oral Q M,W,F  . famotidine  20 mg Oral Daily  . feeding supplement  1 Container Oral TID BM  . folic acid  1 mg Oral Daily  . hydrALAZINE  50 mg Oral TID  . levothyroxine  75 mcg Oral QAC breakfast  . methylPREDNISolone  4 mg Oral Daily  . metoprolol tartrate  75 mg Oral BID  . sodium bicarbonate  650 mg Oral TID  . sodium chloride flush  3 mL Intravenous Q12H   Zetta Bills, MD 03/11/2018, 11:46 AM

## 2018-03-11 NOTE — Progress Notes (Signed)
ANTICOAGULATION CONSULT NOTE - Initial Consult  Pharmacy Consult for Coumadin Indication: atrial fibrillation  Allergies  Allergen Reactions  . Tape Other (See Comments)    Tears skin - please use paper tape  . Levaquin [Levofloxacin In D5w] Other (See Comments)    Per patient this caused insomnia     Patient Measurements: Height: 5' (152.4 cm) Weight: 134 lb 4.2 oz (60.9 kg) IBW/kg (Calculated) : 45.5  Vital Signs: Temp: 98 F (36.7 C) (04/12 0645) BP: 149/56 (04/12 0936) Pulse Rate: 80 (04/12 0936)  Labs: Recent Labs    03/09/18 2259 03/10/18 0850 03/11/18 1000  HGB 10.1* 9.4*  --   HCT 31.2* 29.3*  --   PLT 258 258  --   APTT 52*  --   --   LABPROT 32.1* 29.8* 26.5*  INR 3.15 2.87 2.47  CREATININE 5.09* 5.09* 5.13*    Estimated Creatinine Clearance: 6.9 mL/min (A) (by C-G formula based on SCr of 5.13 mg/dL (H)).   Medical History: Past Medical History:  Diagnosis Date  . Anemia   . CHF (congestive heart failure) (HCC)   . CKD (chronic kidney disease), stage IV (HCC)   . GERD (gastroesophageal reflux disease)   . Gout    "on daily RX" (03/09/2018)  . History of blood transfusion    "related to anemia; recently" (03/09/2018)  . Hx of pulmonary embolus    "related to BCP"  . Hypertension   . Hypothyroidism   . Pericardial effusion   . Pneumonia 02/2018  . RA (rheumatoid arthritis) (HCC)     Medications:  Scheduled:  . acidophilus  1 capsule Oral Daily  . calcitRIOL  0.25 mcg Oral Q M,W,F  . famotidine  20 mg Oral Daily  . feeding supplement  1 Container Oral TID BM  . folic acid  1 mg Oral Daily  . hydrALAZINE  50 mg Oral TID  . levothyroxine  75 mcg Oral QAC breakfast  . methylPREDNISolone  4 mg Oral Daily  . metoprolol tartrate  75 mg Oral BID  . sodium bicarbonate  650 mg Oral TID  . sodium chloride flush  3 mL Intravenous Q12H   Infusions:  . sodium chloride    . ceFEPime (MAXIPIME) IV Stopped (03/11/18 1012)  . furosemide Stopped  (03/11/18 1142)    Assessment: 84 yo F transferred to [facility] from [facility] with needs for initiation of dialysis.  At [facility], pt was started on heparin and bridged to Coumadin for afib.  Her INR remains therapeutic despite last Coumadin dose 4/9.  Doses given at [facility] ranged from 2-5mg .  Goal of Therapy:  INR 2-3 Monitor platelets by anticoagulation protocol: Yes   Plan:  Coumadin 2.5mg  PO x 1 tonight Daily INR  [employer], Pharm.D., BCPS Clinical Pharmacist Pager: [phone] Clinical phone for 03/11/2018 from 8:30-4:00 is [phone]. After 4pm, please call Main Rx [phone]) for assistance. 03/11/2018 12:24 PM

## 2018-03-11 NOTE — Progress Notes (Signed)
Possibility that patient will be going to HD today. Until further notice, all daily and B/P medications held per protocol. Will continue to monitor.

## 2018-03-11 NOTE — Progress Notes (Addendum)
Pt b/p-109/127, HR-101. MD made aware. Awaiting orders.

## 2018-03-11 NOTE — Evaluation (Signed)
Occupational Therapy Evaluation Patient Details Name: Rachel Johns MRN: [id number] DOB: 06-Sep-1934 Today's Date: 03/11/2018    History of Present Illness Pt is an 84 y/o female admitted secondary to SOB and found to be in a-fib with RVR. PMH including but not limited to CHF, HTN and CKD.   Clinical Impression   Pt admitted with the above diagnoses and presents with below problem list. Pt will benefit from continued acute OT to address the below listed deficits and maximize independence with basic ADLs prior to d/c to venue below. PTA pt was likely mod I with ADLs (no family present and unsure of reliability of pt report). Pt reporting feeling very poorly this morning and limited by fatigue. Pt is currently min A for UB/LB ADLs and bed mobility.       Follow Up Recommendations  SNF;Supervision/Assistance - 24 hour    Equipment Recommendations  Other (comment)(defer to next venue)    Recommendations for Other Services       Precautions / Restrictions Precautions Precautions: Fall Restrictions Weight Bearing Restrictions: No      Mobility Bed Mobility Overal bed mobility: Needs Assistance Bed Mobility: Supine to Sit;Sit to Supine     Supine to sit: Min assist;HOB elevated Sit to supine: Min guard   General bed mobility comments: increased time and effort. HHA to powerup trunk.  Transfers                 General transfer comment: declined this session due to fatigue and feeling poorly    Balance Overall balance assessment: Needs assistance Sitting-balance support: Bilateral upper extremity supported;Feet supported Sitting balance-Leahy Scale: Poor Sitting balance - Comments: pt fluctuating between close min guard with and without UE supports. Trunk and neck flexed position.                                    ADL either performed or assessed with clinical judgement   ADL Overall ADL's : Needs assistance/impaired Eating/Feeding: Set up;Sitting    Grooming: Set up;Sitting   Upper Body Bathing: Minimal assistance;Sitting   Lower Body Bathing: Minimal assistance;Sit to/from stand   Upper Body Dressing : Minimal assistance;Sitting   Lower Body Dressing: Minimal assistance;Sit to/from stand   Toilet Transfer: Min guard;Minimal assistance;Stand-pivot;BSC;RW   Toileting- Architect and Hygiene: Min guard;Minimal assistance;Sit to/from stand   Tub/ Shower Transfer: Min guard;Minimal assistance;Stand-pivot;3 in 1;Rolling walker     General ADL Comments: Pt completed bed mobility, sat EOB 1-2 minutes, scooted up in bed. Limited by fatigue and feeling poorly.      Vision         Perception     Praxis      Pertinent Vitals/Pain Pain Assessment: Faces Faces Pain Scale: Hurts whole lot Pain Location: unable to specify Pain Descriptors / Indicators: Grimacing;Guarding Pain Intervention(s): Monitored during session     Hand Dominance Right   Extremity/Trunk Assessment Upper Extremity Assessment Upper Extremity Assessment: Generalized weakness   Lower Extremity Assessment Lower Extremity Assessment: Generalized weakness   Cervical / Trunk Assessment Cervical / Trunk Assessment: Kyphotic   Communication Communication Communication: No difficulties   Cognition Arousal/Alertness: Awake/alert Behavior During Therapy: Flat affect;Restless Overall Cognitive Status: No family/caregiver present to determine baseline cognitive functioning Area of Impairment: Attention;Memory;Following commands;Safety/judgement;Problem solving                   Current Attention Level: Sustained Memory: Decreased short-term  memory;Decreased recall of precautions Following Commands: Follows one step commands with increased time;Follows one step commands inconsistently Safety/Judgement: Decreased awareness of deficits;Decreased awareness of safety   Problem Solving: Difficulty sequencing;Requires verbal cues     General  Comments       Exercises     Shoulder Instructions      Home Living Family/patient expects to be discharged to:: Private residence Living Arrangements: Alone Available Help at Discharge: Family Type of Home: House Home Access: Level entry     Home Layout: One level     Bathroom Shower/Tub: Chief Strategy Officer: Handicapped height     Home Equipment: Medical laboratory scientific officer - quad   Additional Comments: unsure how reliable information is given it was provided by pt and no family/caregivers present      Prior Functioning/Environment Level of Independence: Independent        Comments: unsure how reliable information is given it was provided by pt and no family/caregivers present; cane present in room        OT Problem List: Decreased strength;Decreased activity tolerance;Impaired balance (sitting and/or standing);Decreased cognition;Decreased knowledge of use of DME or AE;Decreased knowledge of precautions;Cardiopulmonary status limiting activity      OT Treatment/Interventions: Self-care/ADL training;Therapeutic exercise;Energy conservation;DME and/or AE instruction;Therapeutic activities;Patient/family education;Balance training    OT Goals(Current goals can be found in the care plan section) Acute Rehab OT Goals Patient Stated Goal: unable to state Time For Goal Achievement: 03/25/18 Potential to Achieve Goals: Good ADL Goals Pt Will Perform Grooming: with set-up;standing;sitting;with supervision Pt Will Perform Upper Body Bathing: with set-up;with supervision;sitting Pt Will Perform Lower Body Bathing: with min guard assist;sit to/from stand Pt Will Perform Upper Body Dressing: with set-up;sitting Pt Will Perform Lower Body Dressing: sit to/from stand;with min guard assist Pt Will Transfer to Toilet: with min guard assist;ambulating Pt Will Perform Toileting - Clothing Manipulation and hygiene: with min guard assist;sit to/from stand  OT Frequency: Min 2X/week    Barriers to D/C:            Co-evaluation              AM-PAC PT "6 Clicks" Daily Activity     Outcome Measure Help from another person eating meals?: A Little Help from another person taking care of personal grooming?: A Little Help from another person toileting, which includes using toliet, bedpan, or urinal?: A Little Help from another person bathing (including washing, rinsing, drying)?: A Little Help from another person to put on and taking off regular upper body clothing?: A Little Help from another person to put on and taking off regular lower body clothing?: A Little 6 Click Score: 18   End of Session    Activity Tolerance: Patient limited by fatigue Patient left: in bed;with call bell/phone within reach;with bed alarm set;with nursing/sitter in room  OT Visit Diagnosis: Unsteadiness on feet (R26.81);Muscle weakness (generalized) (M62.81);Pain                Time: 1008-1020 OT Time Calculation (min): 12 min Charges:  OT General Charges $OT Visit: 1 Visit OT Evaluation $OT Eval Low Complexity: 1 Low G-Codes:       Pilar Grammes 03/11/2018, 11:20 AM

## 2018-03-11 NOTE — Progress Notes (Signed)
Progress Note  Patient Name: Rachel Johns Date of Encounter: 03/11/2018 Primary Care Provider: Street, Stephanie Coup, MD Primary Cardiologist: Norman Herrlich, MD  Nephrology: Dr. Allena Katz ATTENDING: Dr. Hanley Ben  Subjective   Told Dr. Allena Katz (Nephrology) - "I called my son & told him that I was dying". Nauseated, but no SOB.  Inpatient Medications    Scheduled Meds: . acidophilus  1 capsule Oral Daily  . calcitRIOL  0.25 mcg Oral Q M,W,F  . famotidine  20 mg Oral Daily  . feeding supplement  1 Container Oral TID BM  . folic acid  1 mg Oral Daily  . hydrALAZINE  50 mg Oral TID  . levothyroxine  75 mcg Oral QAC breakfast  . methylPREDNISolone  4 mg Oral Daily  . metoprolol tartrate  75 mg Oral BID  . sodium bicarbonate  650 mg Oral TID  . sodium chloride flush  3 mL Intravenous Q12H   Continuous Infusions: . sodium chloride    . ceFEPime (MAXIPIME) IV Stopped (03/11/18 1012)  . furosemide Stopped (03/11/18 1142)   PRN Meds: sodium chloride, acetaminophen, hydrALAZINE, ondansetron (ZOFRAN) IV, sodium chloride flush   Vital Signs    Vitals:   03/10/18 2026 03/11/18 0500 03/11/18 0645 03/11/18 0936  BP: 128/78  (!) 160/127 (!) 149/56  Pulse: (!) 104  90 80  Resp: 16  16 16   Temp: 97.8 F (36.6 C)  98 F (36.7 C)   TempSrc: Oral     SpO2: 94%  95% 95%  Weight: 134 lb 4.2 oz (60.9 kg) 134 lb 4.2 oz (60.9 kg)    Height:        Intake/Output Summary (Last 24 hours) at 03/11/2018 1150 Last data filed at 03/11/2018 0934 Gross per 24 hour  Intake 1038 ml  Output 100 ml  Net 938 ml   Filed Weights   03/10/18 0420 03/10/18 2026 03/11/18 0500  Weight: 134 lb 4.2 oz (60.9 kg) 134 lb 4.2 oz (60.9 kg) 134 lb 4.2 oz (60.9 kg)    Telemetry    A. fib with rate in 100 110 bpm range.- Personally Reviewed  ECG    None- Personally Reviewed  Physical Exam   GEN:  Chronically ill elderly woman who seems to be in moderate distress.  Very uncomfortable and nauseated.   Intermittently dyspneic.  But nontoxic Neck: ++ JVD Cardiac:  Irregularly irregular rhythm with borderline rapid heart rate, no murmurs, rubs, or gallops.  Respiratory:  Mild delay versus.  With bibasilar crackles and rales. GI: Soft, nontender, mildly-distended; relatively hyperactive bowel sounds MS:  2+ bilateral lower extremity edema; No deformity.; LUE AV fistula with palpable thrill. Neuro:  Nonfocal  Psych: Seems to be somewhat depressed and anxious.  Labs    Chemistry Recent Labs  Lab 03/09/18 2259 03/10/18 0850 03/11/18 1000  NA 126* 125* 125*  K 4.2 4.1 3.6  CL 85* 86* 85*  CO2 22 18* 17*  GLUCOSE 99 79 137*  BUN 103* 106* 119*  CREATININE 5.09* 5.09* 5.13*  CALCIUM 8.5* 8.5* 8.4*  PROT 5.3*  --   --   ALBUMIN 2.3* 2.2*  --   AST 22  --   --   ALT 81*  --   --   ALKPHOS 206*  --   --   BILITOT 0.6  --   --   GFRNONAA 7* 7* 7*  GFRAA 8* 8* 8*  ANIONGAP 19* 21* 23*     Hematology Recent Labs  Lab  03/09/18 2259 03/10/18 0850  WBC 25.6* 24.9*  RBC 3.76* 3.51*  HGB 10.1* 9.4*  HCT 31.2* 29.3*  MCV 83.0 83.5  MCH 26.9 26.8  MCHC 32.4 32.1  RDW 17.3* 17.6*  PLT 258 258    Cardiac EnzymesNo results for input(s): TROPONINI in the last 168 hours. No results for input(s): TROPIPOC in the last 168 hours.   BNPNo results for input(s): BNP, PROBNP in the last 168 hours.   DDimer No results for input(s): DDIMER in the last 168 hours.   Radiology    Dg Chest 2 View  Result Date: 03/11/2018 CLINICAL DATA:  Weakness, shortness of Breath EXAM: CHEST - 2 VIEW COMPARISON:  03/03/2018 FINDINGS: Cardiomegaly. Layering bilateral effusions. Vascular congestion and bilateral perihilar and lower lobe airspace opacities, favor edema/CHF, worsening since prior study. Large hiatal hernia. No acute bony abnormality. IMPRESSION: Cardiomegaly. Worsening perihilar and lower lobe airspace opacities, favor edema. Layering bilateral effusions. Electronically Signed   By: Charlett Nose M.D.   On: 03/11/2018 09:25    Cardiac Studies   Echo from [place] reviewed (in Paper Chart) EF is slightly reduced which is probably related to her A. fib and need for dialysis with with biatrial enlargement. Thankfully the pericardial effusion has reduced. She her pulmonary arterial pressure has seems to have increased and that is probably also related to pulmonary venous congestion from A. fib and diastolic dysfunction.  Patient Profile     84 y.o. female with a hx of ESRD who is being followed for management of PAF and acute combined CHF at the request of Dr Hanley Ben.   Assessment & Plan    Paroxysmal Atrial Fibrillation -- after converting to normal sinus rhythm at [facility], she went back into A. fib RVR last night.  Was given IV beta-blocker and rate reduced from 140-150 down to low 100s.  Currently in the low 100s on 3 times daily oral beta-blocker.   While I understand that antihypertensives are being held to allow for dialysis, would not hold beta-blocker because she will go faster.   Acute on chronic combined systolic and diastolic heart heart failure (decompensated). -EF reduced in the setting of acute volume overload and A. fib.  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  Most likely related to volume overload from renal insufficiency.  Not responding to high-dose Lasix.  She had was transferred here for dialysis which hopefully will begin today.  Currently on hydralazine for afterload reduction however this is being held to allow blood pressure for dialysis).  History of pericardial effusion, trivial effusion on recent echo at [place].  Plan:  She needs dialysis.  Continue Lopressor -75 mg BID.  We will follow and adjust as needed.  Echo at [facility] showed her EF to be 40-45%. -->  Restart hydralazine once able post HD initiation    For questions or updates, please contact [facility] Please consult [email or web address] for contact info under  Cardiology/STEMI.      Signed, Bryan Lemma, MD  03/11/2018, 11:50 AM

## 2018-03-12 DIAGNOSIS — E871 Hypo-osmolality and hyponatremia: Secondary | ICD-10-CM

## 2018-03-12 LAB — CBC WITH DIFFERENTIAL/PLATELET
BASOS PCT: 0 %
Basophils Absolute: 0 10*3/uL (ref 0.0–0.1)
EOS ABS: 0 10*3/uL (ref 0.0–0.7)
Eosinophils Relative: 0 %
HCT: 32.7 % — ABNORMAL LOW (ref 36.0–46.0)
HEMOGLOBIN: 10.1 g/dL — AB (ref 12.0–15.0)
Lymphocytes Relative: 5 %
Lymphs Abs: 1.2 10*3/uL (ref 0.7–4.0)
MCH: 26 pg (ref 26.0–34.0)
MCHC: 30.9 g/dL (ref 30.0–36.0)
MCV: 84.3 fL (ref 78.0–100.0)
MONOS PCT: 5 %
Monocytes Absolute: 1.2 10*3/uL — ABNORMAL HIGH (ref 0.1–1.0)
NEUTROS PCT: 90 %
Neutro Abs: 21.4 10*3/uL — ABNORMAL HIGH (ref 1.7–7.7)
Platelets: 270 10*3/uL (ref 150–400)
RBC: 3.88 MIL/uL (ref 3.87–5.11)
RDW: 17.6 % — ABNORMAL HIGH (ref 11.5–15.5)
WBC: 23.8 10*3/uL — ABNORMAL HIGH (ref 4.0–10.5)

## 2018-03-12 LAB — MAGNESIUM: MAGNESIUM: 2.1 mg/dL (ref 1.7–2.4)

## 2018-03-12 LAB — BASIC METABOLIC PANEL
Anion gap: 18 — ABNORMAL HIGH (ref 5–15)
BUN: 86 mg/dL — ABNORMAL HIGH (ref 6–20)
CALCIUM: 8.3 mg/dL — AB (ref 8.9–10.3)
CHLORIDE: 89 mmol/L — AB (ref 101–111)
CO2: 22 mmol/L (ref 22–32)
CREATININE: 4.08 mg/dL — AB (ref 0.44–1.00)
GFR calc non Af Amer: 9 mL/min — ABNORMAL LOW (ref >60)
GFR, EST AFRICAN AMERICAN: 11 mL/min — AB (ref >60)
Glucose, Bld: 88 mg/dL (ref 65–99)
Potassium: 4 mmol/L (ref 3.5–5.1)
SODIUM: 129 mmol/L — AB (ref 135–145)

## 2018-03-12 LAB — HEPATITIS B SURFACE ANTIGEN: HEP B S AG: NEGATIVE

## 2018-03-12 LAB — PARATHYROID HORMONE, INTACT (NO CA): PTH: 266 pg/mL — ABNORMAL HIGH (ref 15–65)

## 2018-03-12 LAB — PROTIME-INR
INR: 2.37
PROTHROMBIN TIME: 25.7 s — AB (ref 11.4–15.2)

## 2018-03-12 MED ORDER — WARFARIN SODIUM 2.5 MG PO TABS
2.5000 mg | ORAL_TABLET | Freq: Once | ORAL | Status: AC
  Administered 2018-03-12: 2.5 mg via ORAL
  Filled 2018-03-12: qty 1

## 2018-03-12 MED ORDER — LANTHANUM CARBONATE 500 MG PO CHEW
500.0000 mg | CHEWABLE_TABLET | Freq: Three times a day (TID) | ORAL | Status: DC
  Administered 2018-03-12 – 2018-03-25 (×35): 500 mg via ORAL
  Filled 2018-03-12 (×34): qty 1

## 2018-03-12 MED ORDER — ZOLPIDEM TARTRATE 5 MG PO TABS
5.0000 mg | ORAL_TABLET | Freq: Once | ORAL | Status: AC
  Administered 2018-03-12: 5 mg via ORAL
  Filled 2018-03-12: qty 1

## 2018-03-12 NOTE — Progress Notes (Signed)
ANTICOAGULATION CONSULT NOTE - Follow Up Consult  Pharmacy Consult for warfarin Indication: atrial fibrillation  Allergies  Allergen Reactions  . Tape Other (See Comments)    Tears skin - please use paper tape  . Levaquin [Levofloxacin In D5w] Other (See Comments)    Per patient this caused insomnia     Patient Measurements: Height: 5' (152.4 cm) Weight: 126 lb 5.2 oz (57.3 kg) IBW/kg (Calculated) : 45.5   Vital Signs: Temp: 97.9 F (36.6 C) (04/13 0900) Temp Source: Oral (04/13 0900) BP: 135/55 (04/13 0900) Pulse Rate: 70 (04/13 0900)  Labs: Recent Labs    03/09/18 2259 03/10/18 0850 03/11/18 1000 03/11/18 1414 03/11/18 1601 03/12/18 0818  HGB 10.1* 9.4* 9.9*  --  9.8* 10.1*  HCT 31.2* 29.3* 30.6*  --  30.1* 32.7*  PLT 258 258 291  --  262 270  APTT 52*  --   --   --   --   --   LABPROT 32.1* 29.8* 26.5*  --   --  25.7*  INR 3.15 2.87 2.47  --   --  2.37  CREATININE 5.09* 5.09* 5.13* 5.08*  --  4.08*    Estimated Creatinine Clearance: 8.4 mL/min (A) (by C-G formula based on SCr of 4.08 mg/dL (H)).   Medications:  Scheduled:  . acidophilus  1 capsule Oral Daily  . calcitRIOL  0.25 mcg Oral Q M,W,F  . famotidine  20 mg Oral Daily  . feeding supplement  1 Container Oral TID BM  . folic acid  1 mg Oral Daily  . hydrALAZINE  50 mg Oral TID  . lanthanum  500 mg Oral TID WC  . levothyroxine  75 mcg Oral QAC breakfast  . methylPREDNISolone  4 mg Oral Daily  . metoprolol tartrate  75 mg Oral BID  . sodium bicarbonate  650 mg Oral TID  . sodium chloride flush  3 mL Intravenous Q12H  . Warfarin - Pharmacist Dosing Inpatient   Does not apply q1800   Infusions:  . sodium chloride      Assessment: 84 YOF transferred from [facility] to initiate dialysis. At [facility] she was started on warfarin/heparin bridge for new Afib. INR remains therapeutic today at 2.37 despite held doses 4/10-4/11 for possible HD cath placement. Now AVG determined OK to use, no need to hold  warfarin. Of note she converted to NSR last night ~ 2200. She will have her 2nd HD session today. CBC is stable, no bleeding noted.  Goal of Therapy:  INR 2-3 Monitor platelets by anticoagulation protocol: Yes   Plan:  Warfarin 2.5 mg PO x 1 Daily INR, CBC Monitor s/sx of bleeding  Garry Heater Jakylan Ron 03/12/2018,2:17 PM

## 2018-03-12 NOTE — Progress Notes (Signed)
Patient ID: Rachel Johns, female   DOB: Jun 24, 1934, 84 y.o.   MRN: [id number]  PROGRESS NOTE    Rachel Johns  WUJ:[id number] DOB: [birth year] DOA: 03/09/2018 PCP: Casper Harrison Stephanie Coup, MD   Brief Narrative:  84 year old female with history of hypertension, CKD stage IV, RA, hypothyroidism, CHF with last EF of 40-45% presented with worsening shortness of breath, worsening lower extremity edema and 7 pound weight gain to [facility] on 03/02/2018.  She was found to be in atrial fibrillation with rapid ventricular rate and probably decompensated diastolic heart failure.  She was started on intravenous Lasix, intravenous Cardizem and IV heparin.  She spontaneously converted to sinus rhythm however further hospital course was complicated with worsening renal function, persistent hyponatremia and leukocytosis.  Patient was transferred to [facility] on 03/09/2018 per nephrology request for need for probable initiation of dialysis.  Cardiology was also consulted.   Assessment & Plan:   Principal Problem:   Acute kidney injury superimposed on chronic kidney disease (HCC) Active Problems:   Rheumatoid arthritis (HCC)   Normocytic anemia   Leukocytosis   Hyponatremia   Fluid overload   Pressure injury of skin   AF (paroxysmal atrial fibrillation) (HCC)   Acute combined systolic and diastolic CHF, NYHA class 3 (HCC)   PFO (patent foramen ovale)   Acute kidney injury on chronic kidney disease stage IV -Nephrology following.  Patient was started on dialysis on 03/11/2018 -Probable dialysis today.  Lasix has been discontinued.  Paroxysmal atrial fibrillation with rapid ventricular response -Initially treated with Cardizem drip -Currently rate better controlled -Cardiology following.  Continue metoprolol. -Continue Coumadin.  Monitor INR  Persistent leukocytosis -Probably reactive from above along with steroid use. -Patient was initially started on Rocephin and Zithromax,  however leukocytosis continue to worsen with negative blood cultures.  CT of the chest and abdomen did not show any obvious infection.  Antibiotics were changed to cefepime at outside hospital. -Dr. Toula Moos with Hematology-Dr. Ferol Luz reviewed peripheral smear-no evidence of immature cells/chronic leukemia-per Hematology-peripheral smear consistent with a infectious process.  -White count is slightly improving.  Currently on cefepime.  Will discontinue antibiotics and monitor  Acute on chronic systolic and diastolic heart failure -Has been started on dialysis. -Continue beta-blocker, hydralazine. - Cardiology following -Echo on 03/03/2018 showed some reduction in EF to 40-45% and severe tricuspid regurgitation with large PFO.  Hyponatremia -Probably second to volume overload -Improving.  Hyperkalemia -Resolved.  Repeat a.m. labs  Hypothyroidism -Continue Synthroid  Essential hypertension -Monitor blood pressure.  Continue metoprolol and hydralazine.  Rheumatoid arthritis -Steroid dependent.  Continue methylprednisone.  Anemia:  -Stable.  Gout:  - No evidence of flare-Continue allopurinol    DVT prophylaxis: Coumadin Code Status: Full Family Communication: None at bedside Disposition Plan: Depends on clinical outcome  Consultants: Nephrology/cardiology  Procedures: None  Antimicrobials: Cefepime   Subjective: Patient seen and examined at bedside.  She still does not feel well.  She cannot elaborate on her symptoms.  Complains of intermittent shortness of breath.  No overnight fever or vomiting. Objective: Vitals:   03/11/18 2005 03/12/18 0104 03/12/18 0448 03/12/18 0900  BP: 119/76  (!) 142/67 (!) 135/55  Pulse: (!) 108  75 70  Resp:    16  Temp: 98 F (36.7 C)   97.9 F (36.6 C)  TempSrc: Oral   Oral  SpO2: 97%  93% 94%  Weight: 57.3 kg (126 lb 5.2 oz) 57.3 kg (126 lb 5.2 oz)    Height:  Intake/Output Summary (Last 24 hours) at 03/12/2018  1013 Last data filed at 03/12/2018 0909 Gross per 24 hour  Intake 583 ml  Output 1900 ml  Net -1317 ml   Filed Weights   03/11/18 1635 03/11/18 2005 03/12/18 0104  Weight: 57.2 kg (126 lb 1.7 oz) 57.3 kg (126 lb 5.2 oz) 57.3 kg (126 lb 5.2 oz)    Examination:  General exam: Elderly female, looks ill.  No acute distress.  Awake but intermittently confused Respiratory system: Bilateral decreased breath sounds at bases Cardiovascular system: S1 & S2 heard, rate controlled  gastrointestinal system: Abdomen is nondistended, soft and nontender. Normal bowel sounds heard. Extremities: No cyanosis, clubbing; 1-2+ edema    Data Reviewed: I have personally reviewed following labs and imaging studies  CBC: Recent Labs  Lab 03/09/18 2259 03/10/18 0850 03/11/18 1000 03/11/18 1601 03/12/18 0818  WBC 25.6* 24.9* 28.4* 25.8* 23.8*  NEUTROABS 23.5* 21.9* 25.9*  --  21.4*  HGB 10.1* 9.4* 9.9* 9.8* 10.1*  HCT 31.2* 29.3* 30.6* 30.1* 32.7*  MCV 83.0 83.5 81.8 81.8 84.3  PLT 258 258 291 262 270   Basic Metabolic Panel: Recent Labs  Lab 03/09/18 2259 03/10/18 0850 03/11/18 1000 03/11/18 1414 03/12/18 0818  NA 126* 125* 125* 125* 129*  K 4.2 4.1 3.6 3.5 4.0  CL 85* 86* 85* 84* 89*  CO2 22 18* 17* 19* 22  GLUCOSE 99 79 137* 160* 88  BUN 103* 106* 119* 124* 86*  CREATININE 5.09* 5.09* 5.13* 5.08* 4.08*  CALCIUM 8.5* 8.5* 8.4* 8.3* 8.3*  MG 1.9  --  1.9  --  2.1  PHOS  --  8.8*  --  8.4*  --    GFR: Estimated Creatinine Clearance: 8.4 mL/min (A) (by C-G formula based on SCr of 4.08 mg/dL (H)). Liver Function Tests: Recent Labs  Lab 03/09/18 2259 03/10/18 0850 03/11/18 1414  AST 22  --   --   ALT 81*  --   --   ALKPHOS 206*  --   --   BILITOT 0.6  --   --   PROT 5.3*  --   --   ALBUMIN 2.3* 2.2* 2.1*   No results for input(s): LIPASE, AMYLASE in the last 168 hours. No results for input(s): AMMONIA in the last 168 hours. Coagulation Profile: Recent Labs  Lab  03/09/18 2259 03/10/18 0850 03/11/18 1000 03/12/18 0818  INR 3.15 2.87 2.47 2.37   Cardiac Enzymes: No results for input(s): CKTOTAL, CKMB, CKMBINDEX, TROPONINI in the last 168 hours. BNP (last 3 results) No results for input(s): PROBNP in the last 8760 hours. HbA1C: No results for input(s): HGBA1C in the last 72 hours. CBG: No results for input(s): GLUCAP in the last 168 hours. Lipid Profile: No results for input(s): CHOL, HDL, LDLCALC, TRIG, CHOLHDL, LDLDIRECT in the last 72 hours. Thyroid Function Tests: No results for input(s): TSH, T4TOTAL, FREET4, T3FREE, THYROIDAB in the last 72 hours. Anemia Panel: No results for input(s): VITAMINB12, FOLATE, FERRITIN, TIBC, IRON, RETICCTPCT in the last 72 hours. Sepsis Labs: No results for input(s): PROCALCITON, LATICACIDVEN in the last 168 hours.  Recent Results (from the past 240 hour(s))  MRSA PCR Screening     Status: None   Collection Time: 03/09/18 11:02 PM  Result Value Ref Range Status   MRSA by PCR NEGATIVE NEGATIVE Final    Comment:        The GeneXpert MRSA Assay (FDA approved for NASAL specimens only), is one component of a comprehensive  MRSA colonization surveillance program. It is not intended to diagnose MRSA infection nor to guide or monitor treatment for MRSA infections. Performed at [facility], [address] [place], [place] [address]          Radiology Studies: Dg Chest 2 View  Result Date: 03/11/2018 CLINICAL DATA:  Weakness, shortness of Breath EXAM: CHEST - 2 VIEW COMPARISON:  03/03/2018 FINDINGS: Cardiomegaly. Layering bilateral effusions. Vascular congestion and bilateral perihilar and lower lobe airspace opacities, favor edema/CHF, worsening since prior study. Large hiatal hernia. No acute bony abnormality. IMPRESSION: Cardiomegaly. Worsening perihilar and lower lobe airspace opacities, favor edema. Layering bilateral effusions. Electronically Signed   By: Charlett Nose M.D.   On: 03/11/2018  09:25        Scheduled Meds: . acidophilus  1 capsule Oral Daily  . calcitRIOL  0.25 mcg Oral Q M,W,F  . famotidine  20 mg Oral Daily  . feeding supplement  1 Container Oral TID BM  . folic acid  1 mg Oral Daily  . hydrALAZINE  50 mg Oral TID  . levothyroxine  75 mcg Oral QAC breakfast  . methylPREDNISolone  4 mg Oral Daily  . metoprolol tartrate  75 mg Oral BID  . sodium bicarbonate  650 mg Oral TID  . sodium chloride flush  3 mL Intravenous Q12H  . Warfarin - Pharmacist Dosing Inpatient   Does not apply q1800   Continuous Infusions: . sodium chloride    . ceFEPime (MAXIPIME) IV Stopped (03/11/18 1012)     LOS: 3 days        Glade Lloyd, MD [facility] Pager [phone]  If 7PM-7AM, please contact night-coverage [email or web address] Password [facility] 03/12/2018, 10:13 AM

## 2018-03-12 NOTE — Progress Notes (Signed)
Dialysis treatment completed.  1500 mL ultrafiltrated and net fluid removal 1000 mL.    Patient status unchanged. Lung sounds diminished to ausculation in all fields. Generalized edema. Cardiac: NSR to ST.  Disconnected lines and removed needles.  Pressure held for 10 minutes and band aid/gauze dressing applied.  Report given to bedside RN, Anisha.

## 2018-03-12 NOTE — Progress Notes (Signed)
Patient arrived to unit per bed.  Reviewed treatment plan and this RN agrees.  Report received from bedside RN, Anisha.  Consent verified.  Patient A & O X 3, off to time. Lung sounds diminished to ausculation in all fields. BLE 1+ pitting edema. Cardiac: NSR to AFIB.  Prepped LLAVG with alcohol and cannulated with two 17 gauge needles.  Pulsation of blood noted.  Flushed access well with saline per protocol.  Connected and secured lines and initiated tx at 1536.  UF goal of 1500 mL and net fluid removal of 1000 mL.  Will continue to monitor.

## 2018-03-12 NOTE — Progress Notes (Signed)
Patient ID: Rachel Johns, female   DOB: [birth year], 84 y.o.   MRN: [id number] [facility] Progress Note   Assessment/ Plan:   1.  Chronic kidney disease stage IV-V with progression to ESRD-started on hemodialysis yesterday with the second treatment scheduled for today as well as started process for outpatient dialysis unit placement. 2.  Acute exacerbation of diastolic heart failure: Transferred for initiation of hemodialysis after refractoriness to diuretic therapy with worsening renal function/hyponatremia-anticipate that she will have further clinical improvement with ultrafiltration/hemodialysis. 3.  Hypertension: Blood pressure appears to be fairly well controlled on current therapy/HD. 4.  Hyponatremia: Secondary to CHF exacerbation as well as declining renal function and impaired free water handling with diuretic resistance-will monitor trend with ultrafiltration/hemodialysis. 5.  Anemia of chronic kidney disease: Iron studies pending, begin ESA when hemoglobin <10. 6.  Secondary hyperparathyroidism: PTH level at goal for chronic kidney disease stage V/ESRD.  Begin phosphorus binder for hyperphosphatemia.  Subjective:   Expresses anxiety about chronic hemodialysis and not getting adequate rest in the hospital.   Objective:   BP (!) 135/55 (BP Location: Left Leg)   Pulse 70   Temp 97.9 F (36.6 C) (Oral)   Resp 16   Ht 5' (1.524 m)   Wt 57.3 kg (126 lb 5.2 oz)   SpO2 94%   BMI 24.67 kg/m   Intake/Output Summary (Last 24 hours) at 03/12/2018 1010 Last data filed at 03/12/2018 0909 Gross per 24 hour  Intake 583 ml  Output 1900 ml  Net -1317 ml   Weight change: -2.7 kg (-5 lb 15.2 oz)  Physical Exam: Gen: Comfortably resting in bed, awake/alert CVS: Irregularly irregular, normal rate, S1 and S2 normal Resp: Decreased breath sounds over bases due to poor inspiratory effort otherwise clear to auscultation, no rhonchi. Abd: Soft, flat, nontender Ext: 1-2+ ankle  edema bilaterally.  Left upper arm AVG with thrill  Imaging: Dg Chest 2 View  Result Date: 03/11/2018 CLINICAL DATA:  Weakness, shortness of Breath EXAM: CHEST - 2 VIEW COMPARISON:  03/03/2018 FINDINGS: Cardiomegaly. Layering bilateral effusions. Vascular congestion and bilateral perihilar and lower lobe airspace opacities, favor edema/CHF, worsening since prior study. Large hiatal hernia. No acute bony abnormality. IMPRESSION: Cardiomegaly. Worsening perihilar and lower lobe airspace opacities, favor edema. Layering bilateral effusions. Electronically Signed   By: Charlett Nose M.D.   On: 03/11/2018 09:25    Labs: BMET Recent Labs  Lab 03/09/18 2259 03/10/18 0850 03/11/18 1000 03/11/18 1414 03/12/18 0818  NA 126* 125* 125* 125* 129*  K 4.2 4.1 3.6 3.5 4.0  CL 85* 86* 85* 84* 89*  CO2 22 18* 17* 19* 22  GLUCOSE 99 79 137* 160* 88  BUN 103* 106* 119* 124* 86*  CREATININE 5.09* 5.09* 5.13* 5.08* 4.08*  CALCIUM 8.5* 8.5* 8.4* 8.3* 8.3*  PHOS  --  8.8*  --  8.4*  --    CBC Recent Labs  Lab 03/09/18 2259 03/10/18 0850 03/11/18 1000 03/11/18 1601 03/12/18 0818  WBC 25.6* 24.9* 28.4* 25.8* 23.8*  NEUTROABS 23.5* 21.9* 25.9*  --  21.4*  HGB 10.1* 9.4* 9.9* 9.8* 10.1*  HCT 31.2* 29.3* 30.6* 30.1* 32.7*  MCV 83.0 83.5 81.8 81.8 84.3  PLT 258 258 291 262 270    Medications:    . acidophilus  1 capsule Oral Daily  . calcitRIOL  0.25 mcg Oral Q M,W,F  . famotidine  20 mg Oral Daily  . feeding supplement  1 Container Oral TID BM  . folic  acid  1 mg Oral Daily  . hydrALAZINE  50 mg Oral TID  . levothyroxine  75 mcg Oral QAC breakfast  . methylPREDNISolone  4 mg Oral Daily  . metoprolol tartrate  75 mg Oral BID  . sodium bicarbonate  650 mg Oral TID  . sodium chloride flush  3 mL Intravenous Q12H  . Warfarin - Pharmacist Dosing Inpatient   Does not apply [id number]   Zetta Bills, MD 03/12/2018, 10:10 AM

## 2018-03-12 NOTE — Progress Notes (Signed)
Progress Note  Patient Name: Rachel Johns Date of Encounter: 03/12/2018  Primary Cardiologist: Norman Herrlich, MD   Subjective   Converted to NSR yesterday around 2200h and maintaining normal rhythm. Second HD session later today. She is not in any physical distress, breathes comfortably, but is clearly depressed about the change in QOL and lifestyle that will be caused by HD.  Inpatient Medications    Scheduled Meds: . acidophilus  1 capsule Oral Daily  . calcitRIOL  0.25 mcg Oral Q M,W,F  . famotidine  20 mg Oral Daily  . feeding supplement  1 Container Oral TID BM  . folic acid  1 mg Oral Daily  . hydrALAZINE  50 mg Oral TID  . lanthanum  500 mg Oral TID WC  . levothyroxine  75 mcg Oral QAC breakfast  . methylPREDNISolone  4 mg Oral Daily  . metoprolol tartrate  75 mg Oral BID  . sodium bicarbonate  650 mg Oral TID  . sodium chloride flush  3 mL Intravenous Q12H  . Warfarin - Pharmacist Dosing Inpatient   Does not apply q1800   Continuous Infusions: . sodium chloride     PRN Meds: sodium chloride, acetaminophen, hydrALAZINE, ondansetron (ZOFRAN) IV, sodium chloride flush   Vital Signs    Vitals:   03/11/18 2005 03/12/18 0104 03/12/18 0448 03/12/18 0900  BP: 119/76  (!) 142/67 (!) 135/55  Pulse: (!) 108  75 70  Resp:    16  Temp: 98 F (36.7 C)   97.9 F (36.6 C)  TempSrc: Oral   Oral  SpO2: 97%  93% 94%  Weight: 126 lb 5.2 oz (57.3 kg) 126 lb 5.2 oz (57.3 kg)    Height:        Intake/Output Summary (Last 24 hours) at 03/12/2018 1343 Last data filed at 03/12/2018 0909 Gross per 24 hour  Intake 583 ml  Output 1500 ml  Net -917 ml   Filed Weights   03/11/18 1635 03/11/18 2005 03/12/18 0104  Weight: 126 lb 1.7 oz (57.2 kg) 126 lb 5.2 oz (57.3 kg) 126 lb 5.2 oz (57.3 kg)    Telemetry    NSR since 2200h yesterday, rare PACs - Personally Reviewed  ECG    No new tracing - Personally Reviewed  Physical Exam  Pale. Comfortable GEN: No acute  distress.   Neck: No JVD Cardiac: RRR, no murmurs, rubs, or gallops.  Respiratory: Clear to auscultation bilaterally. GI: Soft, nontender, non-distended  MS: No edema; No deformity. Neuro:  Nonfocal  Psych: Normal affect   Labs    Chemistry Recent Labs  Lab 03/09/18 2259 03/10/18 0850 03/11/18 1000 03/11/18 1414 03/12/18 0818  NA 126* 125* 125* 125* 129*  K 4.2 4.1 3.6 3.5 4.0  CL 85* 86* 85* 84* 89*  CO2 22 18* 17* 19* 22  GLUCOSE 99 79 137* 160* 88  BUN 103* 106* 119* 124* 86*  CREATININE 5.09* 5.09* 5.13* 5.08* 4.08*  CALCIUM 8.5* 8.5* 8.4* 8.3* 8.3*  PROT 5.3*  --   --   --   --   ALBUMIN 2.3* 2.2*  --  2.1*  --   AST 22  --   --   --   --   ALT 81*  --   --   --   --   ALKPHOS 206*  --   --   --   --   BILITOT 0.6  --   --   --   --  GFRNONAA 7* 7* 7* 7* 9*  GFRAA 8* 8* 8* 8* 11*  ANIONGAP 19* 21* 23* 22* 18*     Hematology Recent Labs  Lab 03/11/18 1000 03/11/18 1601 03/12/18 0818  WBC 28.4* 25.8* 23.8*  RBC 3.74* 3.68* 3.88  HGB 9.9* 9.8* 10.1*  HCT 30.6* 30.1* 32.7*  MCV 81.8 81.8 84.3  MCH 26.5 26.6 26.0  MCHC 32.4 32.6 30.9  RDW 17.2* 17.1* 17.6*  PLT 291 262 270    Cardiac EnzymesNo results for input(s): TROPONINI in the last 168 hours. No results for input(s): TROPIPOC in the last 168 hours.   BNPNo results for input(s): BNP, PROBNP in the last 168 hours.   DDimer No results for input(s): DDIMER in the last 168 hours.   Radiology    Dg Chest 2 View  Result Date: 03/11/2018 CLINICAL DATA:  Weakness, shortness of Breath EXAM: CHEST - 2 VIEW COMPARISON:  03/03/2018 FINDINGS: Cardiomegaly. Layering bilateral effusions. Vascular congestion and bilateral perihilar and lower lobe airspace opacities, favor edema/CHF, worsening since prior study. Large hiatal hernia. No acute bony abnormality. IMPRESSION: Cardiomegaly. Worsening perihilar and lower lobe airspace opacities, favor edema. Layering bilateral effusions. Electronically Signed   By: Charlett Nose M.D.   On: 03/11/2018 09:25    Cardiac Studies   Echo from [place] reviewed (in Paper Chart) EF is slightly reduced which is probably related to her A. fib and need for dialysis with with biatrial enlargement. Thankfully the pericardial effusion has reduced. She her pulmonary arterial pressure has seems to have increased and that is probably also related to pulmonary venous congestion from A. fib and diastolic dysfunction.  Patient Profile     84 y.o. female with acute on CKD, now progressing to ESRD,who is being followed for management of PAFib and acute on chronic CHF with mildly reduced LVEF, nonischemic cardiomyopathy  Assessment & Plan    1. CHF: appears to be at/close to euvolemia. 2. New ESRD:  Second HD session today. 3. PAFib: symptoms and hemodynamics do not appear to be seriously impacted by rhythm. Was well rate controlled in atrial fibrillation. Keep on beta blockers. Warfarin anticoagulation.  For questions or updates, please contact [facility] Please consult [email or web address] for contact info under Cardiology/STEMI.      Signed, Thurmon Fair, MD  03/12/2018, 1:43 PM

## 2018-03-12 NOTE — Progress Notes (Signed)
Patients son is requesting to speak with MD. MD notified.

## 2018-03-13 LAB — IRON AND TIBC
Iron: 59 ug/dL (ref 28–170)
Saturation Ratios: 28 % (ref 10.4–31.8)
TIBC: 209 ug/dL — AB (ref 250–450)
UIBC: 150 ug/dL

## 2018-03-13 LAB — CBC WITH DIFFERENTIAL/PLATELET
Basophils Absolute: 0 10*3/uL (ref 0.0–0.1)
Basophils Relative: 0 %
EOS PCT: 0 %
Eosinophils Absolute: 0 10*3/uL (ref 0.0–0.7)
HCT: 33.9 % — ABNORMAL LOW (ref 36.0–46.0)
Hemoglobin: 10.4 g/dL — ABNORMAL LOW (ref 12.0–15.0)
LYMPHS ABS: 1.4 10*3/uL (ref 0.7–4.0)
LYMPHS PCT: 7 %
MCH: 26.5 pg (ref 26.0–34.0)
MCHC: 30.7 g/dL (ref 30.0–36.0)
MCV: 86.5 fL (ref 78.0–100.0)
MONO ABS: 1.3 10*3/uL — AB (ref 0.1–1.0)
MONOS PCT: 7 %
Neutro Abs: 17 10*3/uL — ABNORMAL HIGH (ref 1.7–7.7)
Neutrophils Relative %: 86 %
PLATELETS: 330 10*3/uL (ref 150–400)
RBC: 3.92 MIL/uL (ref 3.87–5.11)
RDW: 18 % — ABNORMAL HIGH (ref 11.5–15.5)
WBC: 19.8 10*3/uL — ABNORMAL HIGH (ref 4.0–10.5)

## 2018-03-13 LAB — PROTIME-INR
INR: 2.06
Prothrombin Time: 23 seconds — ABNORMAL HIGH (ref 11.4–15.2)

## 2018-03-13 LAB — BASIC METABOLIC PANEL
Anion gap: 15 (ref 5–15)
BUN: 54 mg/dL — AB (ref 6–20)
CALCIUM: 8.4 mg/dL — AB (ref 8.9–10.3)
CO2: 27 mmol/L (ref 22–32)
Chloride: 93 mmol/L — ABNORMAL LOW (ref 101–111)
Creatinine, Ser: 3.17 mg/dL — ABNORMAL HIGH (ref 0.44–1.00)
GFR calc Af Amer: 15 mL/min — ABNORMAL LOW (ref >60)
GFR, EST NON AFRICAN AMERICAN: 13 mL/min — AB (ref >60)
GLUCOSE: 98 mg/dL (ref 65–99)
Potassium: 3.6 mmol/L (ref 3.5–5.1)
Sodium: 135 mmol/L (ref 135–145)

## 2018-03-13 LAB — FERRITIN: FERRITIN: 233 ng/mL (ref 11–307)

## 2018-03-13 LAB — MAGNESIUM: Magnesium: 2 mg/dL (ref 1.7–2.4)

## 2018-03-13 MED ORDER — WARFARIN SODIUM 2.5 MG PO TABS
3.7500 mg | ORAL_TABLET | Freq: Once | ORAL | Status: AC
  Administered 2018-03-13: 3.75 mg via ORAL
  Filled 2018-03-13: qty 1

## 2018-03-13 MED ORDER — METOPROLOL TARTRATE 100 MG PO TABS
100.0000 mg | ORAL_TABLET | Freq: Two times a day (BID) | ORAL | Status: DC
  Administered 2018-03-13 – 2018-03-25 (×22): 100 mg via ORAL
  Filled 2018-03-13 (×23): qty 1

## 2018-03-13 MED ORDER — RENA-VITE PO TABS
1.0000 | ORAL_TABLET | Freq: Every day | ORAL | Status: DC
  Administered 2018-03-13 – 2018-03-25 (×13): 1 via ORAL
  Filled 2018-03-13 (×13): qty 1

## 2018-03-13 NOTE — Progress Notes (Signed)
Patient ID: Rachel Johns, female   DOB: Jun 16, 1934, 84 y.o.   MRN: [id number] [facility] Progress Note   Assessment/ Plan:   1.    End-stage renal disease-progressive chronic kidney disease secondary to IgA-started on hemodialysis on 4/12 via left upper arm AVG, process for outpatient dialysis unit placement is underway. 2.  Acute exacerbation of diastolic heart failure: Was refractory to diuretic therapy at [facility] and transferred to [facility] for initiation of hemodialysis-currently appears to be heading towards euvolemic state. 3.  Hypertension: Blood pressure appears to be fairly well controlled on current therapy/HD. 4.    Nutrition:  Continue renal diet/oral nutritional supplements and start Rena-Vite. 5.  Anemia of chronic kidney disease: Iron studies pending, begin ESA when hemoglobin <10. 6.  Secondary hyperparathyroidism: PTH level at goal for chronic kidney disease stage V/ESRD.  Begin phosphorus binder for hyperphosphatemia.  Subjective:   She reports to be feeling better, had a good dialysis treatment yesterday and is excited that she urinated last night.   Objective:   BP 118/81 (BP Location: Right Arm)   Pulse 64   Temp 97.7 F (36.5 C) (Oral)   Resp 18   Ht 5' (1.524 m)   Wt 56.3 kg (124 lb 1.9 oz)   SpO2 98%   BMI 24.24 kg/m   Intake/Output Summary (Last 24 hours) at 03/13/2018 1051 Last data filed at 03/13/2018 0845 Gross per 24 hour  Intake 960 ml  Output 1650 ml  Net -690 ml   Weight change: -0.9 kg (-1 lb 15.7 oz)  Physical Exam: Gen: Comfortably resting in bed, awake/alert CVS: Irregularly irregular, normal rate, S1 and S2 normal Resp: Clear to auscultation, no rales/rhonchi. Abd: Soft, flat, nontender Ext: 1+ ankle edema bilaterally.  Left upper arm AVG with thrill  Imaging: No results found.  Labs: BMET Recent Labs  Lab 03/09/18 2259 03/10/18 0850 03/11/18 1000 03/11/18 1414 03/12/18 0818 03/13/18 0751  NA 126* 125*  125* 125* 129* 135  K 4.2 4.1 3.6 3.5 4.0 3.6  CL 85* 86* 85* 84* 89* 93*  CO2 22 18* 17* 19* 22 27  GLUCOSE 99 79 137* 160* 88 98  BUN 103* 106* 119* 124* 86* 54*  CREATININE 5.09* 5.09* 5.13* 5.08* 4.08* 3.17*  CALCIUM 8.5* 8.5* 8.4* 8.3* 8.3* 8.4*  PHOS  --  8.8*  --  8.4*  --   --    CBC Recent Labs  Lab 03/10/18 0850 03/11/18 1000 03/11/18 1601 03/12/18 0818 03/13/18 0751  WBC 24.9* 28.4* 25.8* 23.8* 19.8*  NEUTROABS 21.9* 25.9*  --  21.4* 17.0*  HGB 9.4* 9.9* 9.8* 10.1* 10.4*  HCT 29.3* 30.6* 30.1* 32.7* 33.9*  MCV 83.5 81.8 81.8 84.3 86.5  PLT 258 291 262 270 330    Medications:    . acidophilus  1 capsule Oral Daily  . calcitRIOL  0.25 mcg Oral Q M,W,F  . famotidine  20 mg Oral Daily  . feeding supplement  1 Container Oral TID BM  . folic acid  1 mg Oral Daily  . hydrALAZINE  50 mg Oral TID  . lanthanum  500 mg Oral TID WC  . levothyroxine  75 mcg Oral QAC breakfast  . methylPREDNISolone  4 mg Oral Daily  . metoprolol tartrate  75 mg Oral BID  . sodium chloride flush  3 mL Intravenous Q12H  . Warfarin - Pharmacist Dosing Inpatient   Does not apply [id number]   Zetta Bills, MD 03/13/2018, 10:51 AM

## 2018-03-13 NOTE — Clinical Social Work Note (Signed)
Clinical Social Work Assessment  Patient Details  Name: Rachel Johns MRN: [id number] Date of Birth: March 27, 1934  Date of referral:  03/13/18               Reason for consult:  Facility Placement                Permission sought to share information with:  Family Supports Permission granted to share information::     Name::     Midwife::     Relationship::  Son  Solicitor Information:     Housing/Transportation Living arrangements for the past 2 months:  Skilled Building surveyor of Information:  Adult Children Patient Interpreter Needed:  None Criminal Activity/Legal Involvement Pertinent to Current Situation/Hospitalization:  No - Comment as needed Significant Relationships:  Adult Children Lives with:  Self Do you feel safe going back to the place where you live?    Need for family participation in patient care:  No (Coment)  Care giving concerns:  Pt is only alert to self and place. CSW spoke with pt's son. Pt lives home alone.    Social Worker assessment / plan:  CSW spoke with pt's son via telephone. CSW states that disposition plan is undertermined at this time but there is potential that the pt will need SNF. Pt's son understanding and states that if pt does need SNf they would prefer [facility] in [place]. CSW to make a note of it and follow for dispo plan.  Employment status:  Retired Database administrator PT Recommendations:  Skilled Nursing Facility Information / Referral to community resources:  Skilled Nursing Facility  Patient/Family's Response to care:  Pt's son verbalized understanding of CSW role and expressed appreciation for support. Pt's son denies any concern regarding pt care at this time.   Patient/Family's Understanding of and Emotional Response to Diagnosis, Current Treatment, and Prognosis:  Pt's son understanding and realistic regarding pt's physical limitations. Pt's son agreeable for pt to go to SNF if that is the  recommendation at d/c. Pt's son denies any concern regarding pt's treatment plan at this time. CSW will continue to provide support and facilitate d/c needs.   Emotional Assessment Appearance:  Appears stated age Attitude/Demeanor/Rapport:  Unable to Assess Affect (typically observed):  Unable to Assess Orientation:  Oriented to Place, Oriented to Self Alcohol / Substance use:  Not Applicable Psych involvement (Current and /or in the community):  No (Comment)  Discharge Needs  Concerns to be addressed:  Basic Needs, Care Coordination Readmission within the last 30 days:  No Current discharge risk:  Dependent with Mobility Barriers to Discharge:  Continued Medical Work up   [employer], LCSW 03/13/2018, 3:40 PM

## 2018-03-13 NOTE — Progress Notes (Addendum)
ANTICOAGULATION CONSULT NOTE - Follow Up Consult  Pharmacy Consult for warfarin Indication: atrial fibrillation  Allergies  Allergen Reactions  . Tape Other (See Comments)    Tears skin - please use paper tape  . Levaquin [Levofloxacin In D5w] Other (See Comments)    Per patient this caused insomnia     Patient Measurements: Height: 5' (152.4 cm) Weight: 124 lb 1.9 oz (56.3 kg) IBW/kg (Calculated) : 45.5   Vital Signs: Temp: 97.7 F (36.5 C) (04/14 0754) Temp Source: Oral (04/14 0754) BP: 118/81 (04/14 0754) Pulse Rate: 64 (04/14 0754)  Labs: Recent Labs    03/11/18 1000 03/11/18 1414 03/11/18 1601 03/12/18 0818 03/13/18 0751  HGB 9.9*  --  9.8* 10.1* 10.4*  HCT 30.6*  --  30.1* 32.7* 33.9*  PLT 291  --  262 270 330  LABPROT 26.5*  --   --  25.7* 23.0*  INR 2.47  --   --  2.37 2.06  CREATININE 5.13* 5.08*  --  4.08* 3.17*    Estimated Creatinine Clearance: 10.8 mL/min (A) (by C-G formula based on SCr of 3.17 mg/dL (H)).   Medications:  Scheduled:  . acidophilus  1 capsule Oral Daily  . calcitRIOL  0.25 mcg Oral Q M,W,F  . famotidine  20 mg Oral Daily  . feeding supplement  1 Container Oral TID BM  . folic acid  1 mg Oral Daily  . hydrALAZINE  50 mg Oral TID  . lanthanum  500 mg Oral TID WC  . levothyroxine  75 mcg Oral QAC breakfast  . methylPREDNISolone  4 mg Oral Daily  . metoprolol tartrate  75 mg Oral BID  . multivitamin  1 tablet Oral QHS  . sodium chloride flush  3 mL Intravenous Q12H  . warfarin  3.75 mg Oral ONCE-1800  . Warfarin - Pharmacist Dosing Inpatient   Does not apply q1800   Infusions:  . sodium chloride      Assessment: 84 YOF transferred from [facility] to initiate dialysis. At [facility] she was started on warfarin/heparin bridge for new Afib. INR remains therapeutic today at 2.06 despite held doses 4/10-4/11 for possible HD cath placement. Now AVG determined OK to use, no need to hold warfarin. Of note she converted to NSR last night  ~ 2200. She will have her 2nd HD session today. CBC is stable, no bleeding noted.  Consider regimen similar to 2.5 mg most days, 3.75 mg 1-2x per week  Goal of Therapy:  INR 2-3 Monitor platelets by anticoagulation protocol: Yes   Plan:  Warfarin 3.75 mg PO x 1 Daily INR, CBC Monitor s/sx of bleeding   Al Corpus, PharmD PGY1 Pharmacy Resident Phone: [phone] After 3:30PM please call Main Pharmacy [phone] 03/13/2018,1:20 PM

## 2018-03-13 NOTE — Progress Notes (Signed)
Progress Note  Patient Name: Rachel Johns Date of Encounter: 03/13/2018  Primary Cardiologist: Norman Herrlich, MD   Subjective   Feels well. Was unaware of the several hours-long episode of paroxysmal atrial fibrillation with mild RVR (110s) last night.  Inpatient Medications    Scheduled Meds: . acidophilus  1 capsule Oral Daily  . calcitRIOL  0.25 mcg Oral Q M,W,F  . famotidine  20 mg Oral Daily  . feeding supplement  1 Container Oral TID BM  . folic acid  1 mg Oral Daily  . hydrALAZINE  50 mg Oral TID  . lanthanum  500 mg Oral TID WC  . levothyroxine  75 mcg Oral QAC breakfast  . methylPREDNISolone  4 mg Oral Daily  . metoprolol tartrate  75 mg Oral BID  . multivitamin  1 tablet Oral QHS  . sodium chloride flush  3 mL Intravenous Q12H  . warfarin  3.75 mg Oral ONCE-1800  . Warfarin - Pharmacist Dosing Inpatient   Does not apply q1800   Continuous Infusions: . sodium chloride     PRN Meds: sodium chloride, acetaminophen, hydrALAZINE, ondansetron (ZOFRAN) IV, sodium chloride flush   Vital Signs    Vitals:   03/12/18 2043 03/13/18 0500 03/13/18 0526 03/13/18 0754  BP: 136/66  (!) 148/80 118/81  Pulse: 85  65 64  Resp: 16  18 18   Temp: 98.6 F (37 C)  98.4 F (36.9 C) 97.7 F (36.5 C)  TempSrc: Oral  Oral Oral  SpO2: 95%  93% 98%  Weight: 124 lb 1.9 oz (56.3 kg) 124 lb 1.9 oz (56.3 kg)    Height:        Intake/Output Summary (Last 24 hours) at 03/13/2018 1327 Last data filed at 03/13/2018 0845 Gross per 24 hour  Intake 720 ml  Output 1650 ml  Net -930 ml   Filed Weights   03/12/18 1806 03/12/18 2043 03/13/18 0500  Weight: 124 lb 1.9 oz (56.3 kg) 124 lb 1.9 oz (56.3 kg) 124 lb 1.9 oz (56.3 kg)    Telemetry    NAR, had PAFib w mild RVR starting 1900h last evening, resolved after several hours - Personally Reviewed  ECG    No new tracing - Personally Reviewed  Physical Exam  Comfortable GEN: No acute distress.   Neck: No JVD Cardiac: RRR, no  murmurs, rubs, or gallops.  Respiratory: Clear to auscultation bilaterally. GI: Soft, nontender, non-distended  MS: No edema; No deformity. Neuro:  Nonfocal  Psych: Normal affect   Labs    Chemistry Recent Labs  Lab 03/09/18 2259 03/10/18 0850  03/11/18 1414 03/12/18 0818 03/13/18 0751  NA 126* 125*   < > 125* 129* 135  K 4.2 4.1   < > 3.5 4.0 3.6  CL 85* 86*   < > 84* 89* 93*  CO2 22 18*   < > 19* 22 27  GLUCOSE 99 79   < > 160* 88 98  BUN 103* 106*   < > 124* 86* 54*  CREATININE 5.09* 5.09*   < > 5.08* 4.08* 3.17*  CALCIUM 8.5* 8.5*   < > 8.3* 8.3* 8.4*  PROT 5.3*  --   --   --   --   --   ALBUMIN 2.3* 2.2*  --  2.1*  --   --   AST 22  --   --   --   --   --   ALT 81*  --   --   --   --   --  ALKPHOS 206*  --   --   --   --   --   BILITOT 0.6  --   --   --   --   --   GFRNONAA 7* 7*   < > 7* 9* 13*  GFRAA 8* 8*   < > 8* 11* 15*  ANIONGAP 19* 21*   < > 22* 18* 15   < > = values in this interval not displayed.     Hematology Recent Labs  Lab 03/11/18 1601 03/12/18 0818 03/13/18 0751  WBC 25.8* 23.8* 19.8*  RBC 3.68* 3.88 3.92  HGB 9.8* 10.1* 10.4*  HCT 30.1* 32.7* 33.9*  MCV 81.8 84.3 86.5  MCH 26.6 26.0 26.5  MCHC 32.6 30.9 30.7  RDW 17.1* 17.6* 18.0*  PLT 262 270 330    Cardiac EnzymesNo results for input(s): TROPONINI in the last 168 hours. No results for input(s): TROPIPOC in the last 168 hours.   BNPNo results for input(s): BNP, PROBNP in the last 168 hours.   DDimer No results for input(s): DDIMER in the last 168 hours.   Radiology    No results found.  Cardiac Studies   Echo from [place] reviewed (in Paper Chart) EF is slightly reduced which is probably related to her A. fib and need for dialysis with with biatrial enlargement. Thankfully the pericardial effusion has reduced. She her pulmonary arterial pressure has seems to have increased and that is probably also related to pulmonary venous congestion from A. fib and diastolic  dysfunction.   Patient Profile     84 y.o. female with acute on CKD, now progressing to ESRD,whois being followed for management ofPAFib andacute on chronicCHF with mildly reduced LVEF, nonischemic cardiomyopathy  Assessment & Plan    1. CHF: no signs or symptoms of hypervolemia 2. New ESRD:  tolerating HD better than she had expected so far 3. PAFib: she is oblivious to the arrhythmia from a symptom point of view. Will increase beta blocker for better rate control. Warfarin anticoagulation.    For questions or updates, please contact [facility] Please consult [email or web address] for contact info under Cardiology/STEMI.      Signed, Thurmon Fair, MD  03/13/2018, 1:27 PM

## 2018-03-13 NOTE — Progress Notes (Signed)
Patient ID: Rachel Johns, female   DOB: 1934-05-14, 84 y.o.   MRN: [id number]  PROGRESS NOTE    Rachel Johns  FPO:[id number] DOB: December 24, 1933 DOA: 03/09/2018 PCP: Casper Harrison Stephanie Coup, MD   Brief Narrative:  84 year old female with history of hypertension, CKD stage IV, RA, hypothyroidism, CHF with last EF of 40-45% presented with worsening shortness of breath, worsening lower extremity edema and 7 pound weight gain to [facility] on 03/02/2018.  She was found to be in atrial fibrillation with rapid ventricular rate and probably decompensated diastolic heart failure.  She was started on intravenous Lasix, intravenous Cardizem and IV heparin.  She spontaneously converted to sinus rhythm however further hospital course was complicated with worsening renal function, persistent hyponatremia and leukocytosis.  Patient was transferred to [facility] on 03/09/2018 per nephrology request for need for probable initiation of dialysis.  Cardiology was also consulted.  Patient has been started on dialysis.   Assessment & Plan:   Principal Problem:   Acute kidney injury superimposed on chronic kidney disease (HCC) Active Problems:   Rheumatoid arthritis (HCC)   Normocytic anemia   Leukocytosis   Hyponatremia   Fluid overload   Pressure injury of skin   AF (paroxysmal atrial fibrillation) (HCC)   Acute combined systolic and diastolic CHF, NYHA class 3 (HCC)   PFO (patent foramen ovale)   Acute kidney injury on chronic kidney disease stage IV -Nephrology following.  Patient was started on dialysis on 03/11/2018 -Patient had dialysis again on 03/12/2018.  Lasix has been discontinued.  Paroxysmal atrial fibrillation with rapid ventricular response -Initially treated with Cardizem drip -Currently rate better controlled -Cardiology following.  Continue metoprolol. -Continue Coumadin.  Monitor INR  Persistent leukocytosis -Probably reactive from above along with steroid  use. -Patient was initially started on Rocephin and Zithromax, however leukocytosis continue to worsen with negative blood cultures.  CT of the chest and abdomen did not show any obvious infection.  Antibiotics were changed to cefepime at outside hospital. -Dr. Toula Moos with Hematology-Dr. Ferol Luz reviewed peripheral smear-no evidence of immature cells/chronic leukemia-per Hematology-peripheral smear consistent with a infectious process.  -Improving.  Cefepime was discontinued on 03/12/2018.  Repeat a.m. Labs  Acute on chronic systolic and diastolic heart failure -Has been started on dialysis. -Continue beta-blocker, hydralazine. - Cardiology following -Echo on 03/03/2018 showed some reduction in EF to 40-45% and severe tricuspid regurgitation with large PFO.  Hyponatremia -Probably secondary to volume overload -Improved.  Hyperkalemia -Resolved.  Repeat a.m. labs  Hypothyroidism -Continue Synthroid  Essential hypertension -Monitor blood pressure.  Continue metoprolol and hydralazine.  Rheumatoid arthritis -Steroid dependent.  Continue methylprednisone.  Anemia:  -Stable.  Gout:  - No evidence of flare-Continue allopurinol    DVT prophylaxis: Coumadin Code Status: Full Family Communication: Spoke to son [name] on phone on 03/12/2018 Disposition Plan: Depends on clinical outcome  Consultants: Nephrology/cardiology  Procedures: None  Antimicrobials: Cefepime discontinued on 03/12/2018   Subjective: Patient seen and examined at bedside.  She feels slightly better this morning.  Still slightly short of breath.  No overnight fever, nausea or vomiting.    Objective: Vitals:   03/12/18 2043 03/13/18 0500 03/13/18 0526 03/13/18 0754  BP: 136/66  (!) 148/80 118/81  Pulse: 85  65 64  Resp: 16  18 18   Temp: 98.6 F (37 C)  98.4 F (36.9 C) 97.7 F (36.5 C)  TempSrc: Oral  Oral Oral  SpO2: 95%  93% 98%  Weight: 56.3 kg (124 lb 1.9  oz) 56.3 kg (124 lb 1.9 oz)     Height:        Intake/Output Summary (Last 24 hours) at 03/13/2018 0939 Last data filed at 03/13/2018 0845 Gross per 24 hour  Intake 960 ml  Output 1650 ml  Net -690 ml   Filed Weights   03/12/18 1806 03/12/18 2043 03/13/18 0500  Weight: 56.3 kg (124 lb 1.9 oz) 56.3 kg (124 lb 1.9 oz) 56.3 kg (124 lb 1.9 oz)    Examination:  General exam: No acute distress.  Sitting on chair.  More awake.   Respiratory system: Bilateral decreased breath sounds at bases with some scattered crackles Cardiovascular system: S1 & S2 heard, rate controlled  gastrointestinal system: Abdomen is nondistended, soft and nontender. Normal bowel sounds heard. Extremities: No cyanosis, clubbing; 1-2+ edema    Data Reviewed: I have personally reviewed following labs and imaging studies  CBC: Recent Labs  Lab 03/09/18 2259 03/10/18 0850 03/11/18 1000 03/11/18 1601 03/12/18 0818 03/13/18 0751  WBC 25.6* 24.9* 28.4* 25.8* 23.8* 19.8*  NEUTROABS 23.5* 21.9* 25.9*  --  21.4* 17.0*  HGB 10.1* 9.4* 9.9* 9.8* 10.1* 10.4*  HCT 31.2* 29.3* 30.6* 30.1* 32.7* 33.9*  MCV 83.0 83.5 81.8 81.8 84.3 86.5  PLT 258 258 291 262 270 330   Basic Metabolic Panel: Recent Labs  Lab 03/09/18 2259 03/10/18 0850 03/11/18 1000 03/11/18 1414 03/12/18 0818 03/13/18 0751  NA 126* 125* 125* 125* 129* 135  K 4.2 4.1 3.6 3.5 4.0 3.6  CL 85* 86* 85* 84* 89* 93*  CO2 22 18* 17* 19* 22 27  GLUCOSE 99 79 137* 160* 88 98  BUN 103* 106* 119* 124* 86* 54*  CREATININE 5.09* 5.09* 5.13* 5.08* 4.08* 3.17*  CALCIUM 8.5* 8.5* 8.4* 8.3* 8.3* 8.4*  MG 1.9  --  1.9  --  2.1 2.0  PHOS  --  8.8*  --  8.4*  --   --    GFR: Estimated Creatinine Clearance: 10.8 mL/min (A) (by C-G formula based on SCr of 3.17 mg/dL (H)). Liver Function Tests: Recent Labs  Lab 03/09/18 2259 03/10/18 0850 03/11/18 1414  AST 22  --   --   ALT 81*  --   --   ALKPHOS 206*  --   --   BILITOT 0.6  --   --   PROT 5.3*  --   --   ALBUMIN 2.3* 2.2* 2.1*    No results for input(s): LIPASE, AMYLASE in the last 168 hours. No results for input(s): AMMONIA in the last 168 hours. Coagulation Profile: Recent Labs  Lab 03/09/18 2259 03/10/18 0850 03/11/18 1000 03/12/18 0818 03/13/18 0751  INR 3.15 2.87 2.47 2.37 2.06   Cardiac Enzymes: No results for input(s): CKTOTAL, CKMB, CKMBINDEX, TROPONINI in the last 168 hours. BNP (last 3 results) No results for input(s): PROBNP in the last 8760 hours. HbA1C: No results for input(s): HGBA1C in the last 72 hours. CBG: No results for input(s): GLUCAP in the last 168 hours. Lipid Profile: No results for input(s): CHOL, HDL, LDLCALC, TRIG, CHOLHDL, LDLDIRECT in the last 72 hours. Thyroid Function Tests: No results for input(s): TSH, T4TOTAL, FREET4, T3FREE, THYROIDAB in the last 72 hours. Anemia Panel: No results for input(s): VITAMINB12, FOLATE, FERRITIN, TIBC, IRON, RETICCTPCT in the last 72 hours. Sepsis Labs: No results for input(s): PROCALCITON, LATICACIDVEN in the last 168 hours.  Recent Results (from the past 240 hour(s))  MRSA PCR Screening     Status: None  Collection Time: 03/09/18 11:02 PM  Result Value Ref Range Status   MRSA by PCR NEGATIVE NEGATIVE Final    Comment:        The GeneXpert MRSA Assay (FDA approved for NASAL specimens only), is one component of a comprehensive MRSA colonization surveillance program. It is not intended to diagnose MRSA infection nor to guide or monitor treatment for MRSA infections. Performed at [facility], [address], [place], [place] [address]          Radiology Studies: No results found.      Scheduled Meds: . acidophilus  1 capsule Oral Daily  . calcitRIOL  0.25 mcg Oral Q M,W,F  . famotidine  20 mg Oral Daily  . feeding supplement  1 Container Oral TID BM  . folic acid  1 mg Oral Daily  . hydrALAZINE  50 mg Oral TID  . lanthanum  500 mg Oral TID WC  . levothyroxine  75 mcg Oral QAC breakfast  .  methylPREDNISolone  4 mg Oral Daily  . metoprolol tartrate  75 mg Oral BID  . sodium bicarbonate  650 mg Oral TID  . sodium chloride flush  3 mL Intravenous Q12H  . Warfarin - Pharmacist Dosing Inpatient   Does not apply q1800   Continuous Infusions: . sodium chloride       LOS: 4 days        Glade Lloyd, MD [facility] Pager [phone]  If 7PM-7AM, please contact night-coverage [email or web address] Password [facility] 03/13/2018, 9:39 AM

## 2018-03-14 DIAGNOSIS — Z992 Dependence on renal dialysis: Secondary | ICD-10-CM

## 2018-03-14 DIAGNOSIS — E43 Unspecified severe protein-calorie malnutrition: Secondary | ICD-10-CM

## 2018-03-14 DIAGNOSIS — N186 End stage renal disease: Secondary | ICD-10-CM

## 2018-03-14 LAB — RENAL FUNCTION PANEL
ALBUMIN: 2.1 g/dL — AB (ref 3.5–5.0)
ANION GAP: 15 (ref 5–15)
BUN: 68 mg/dL — ABNORMAL HIGH (ref 6–20)
CALCIUM: 7.9 mg/dL — AB (ref 8.9–10.3)
CO2: 24 mmol/L (ref 22–32)
Chloride: 93 mmol/L — ABNORMAL LOW (ref 101–111)
Creatinine, Ser: 3.75 mg/dL — ABNORMAL HIGH (ref 0.44–1.00)
GFR calc non Af Amer: 10 mL/min — ABNORMAL LOW (ref >60)
GFR, EST AFRICAN AMERICAN: 12 mL/min — AB (ref >60)
Glucose, Bld: 79 mg/dL (ref 65–99)
PHOSPHORUS: 5.4 mg/dL — AB (ref 2.5–4.6)
POTASSIUM: 3.8 mmol/L (ref 3.5–5.1)
SODIUM: 132 mmol/L — AB (ref 135–145)

## 2018-03-14 LAB — CBC WITH DIFFERENTIAL/PLATELET
BASOS ABS: 0 10*3/uL (ref 0.0–0.1)
Basophils Relative: 0 %
Eosinophils Absolute: 0.1 10*3/uL (ref 0.0–0.7)
Eosinophils Relative: 1 %
HEMATOCRIT: 29.5 % — AB (ref 36.0–46.0)
HEMOGLOBIN: 9.2 g/dL — AB (ref 12.0–15.0)
LYMPHS PCT: 21 %
Lymphs Abs: 3.8 10*3/uL (ref 0.7–4.0)
MCH: 27.1 pg (ref 26.0–34.0)
MCHC: 31.2 g/dL (ref 30.0–36.0)
MCV: 86.8 fL (ref 78.0–100.0)
MONO ABS: 1.5 10*3/uL — AB (ref 0.1–1.0)
MONOS PCT: 8 %
NEUTROS ABS: 13.1 10*3/uL — AB (ref 1.7–7.7)
NEUTROS PCT: 70 %
Platelets: 331 10*3/uL (ref 150–400)
RBC: 3.4 MIL/uL — ABNORMAL LOW (ref 3.87–5.11)
RDW: 18.6 % — AB (ref 11.5–15.5)
WBC: 18.5 10*3/uL — ABNORMAL HIGH (ref 4.0–10.5)

## 2018-03-14 LAB — PROTIME-INR
INR: 2.32
PROTHROMBIN TIME: 25.3 s — AB (ref 11.4–15.2)

## 2018-03-14 LAB — MAGNESIUM: Magnesium: 1.9 mg/dL (ref 1.7–2.4)

## 2018-03-14 MED ORDER — WARFARIN SODIUM 2.5 MG PO TABS
2.5000 mg | ORAL_TABLET | Freq: Once | ORAL | Status: AC
  Administered 2018-03-14: 2.5 mg via ORAL
  Filled 2018-03-14: qty 1

## 2018-03-14 NOTE — Progress Notes (Signed)
Patient ID: Rachel Johns, female   DOB: September 19, 1934, 84 y.o.   MRN: [id number] [facility] Progress Note   Assessment/ Plan:   1.    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  Will bring back tomorrow. 2.  Acute exacerbation of diastolic heart failure: Was refractory to diuretic therapy at [facility] and transferred to [facility] for initiation of hemodialysis-appears closer to euvolemia 3.  Hypertension: Blood pressure appears to be fairly well controlled on current therapy/HD. 4.    Nutrition:  Continue renal diet/oral nutritional supplements and start Rena-Vite. 5.  Anemia of chronic kidney disease: Iron studies pending, begin ESA when hemoglobin <10. 6.  Secondary hyperparathyroidism: PTH level at goal for chronic kidney disease stage V/ESRD.  Begin phosphorus binder for hyperphosphatemia.  Subjective:    Venous needle infiltrated today, couldn't complete rx.  Will bring back tomorrow.   Objective:   BP (!) 136/57 (BP Location: Right Arm)   Pulse 72   Temp 97.8 F (36.6 C) (Oral)   Resp 20   Ht 5' (1.524 m)   Wt 56.3 kg (124 lb 1.9 oz)   SpO2 99%   BMI 24.24 kg/m   Intake/Output Summary (Last 24 hours) at 03/14/2018 1148 Last data filed at 03/14/2018 1015 Gross per 24 hour  Intake 800 ml  Output 400 ml  Net 400 ml   Weight change: -1 kg (-2 lb 3.3 oz)  Physical Exam: Gen: Comfortably resting in bed, awake/alert CVS: Irregularly irregular, normal rate, S1 and S2 normal Resp: Clear to auscultation, no rales/rhonchi. Abd: Soft, flat, nontender Ext: 1+ ankle edema bilaterally.  Left upper arm AVG with thrill, + venous infiltration with ice pack over it  Imaging: No results found.  Labs: BMET Recent Labs  Lab 03/09/18 2259 03/10/18 0850 03/11/18 1000 03/11/18 1414 03/12/18 0818 03/13/18 0751 03/14/18 0921  NA 126* 125* 125* 125* 129* 135 132*   K 4.2 4.1 3.6 3.5 4.0 3.6 3.8  CL 85* 86* 85* 84* 89* 93* 93*  CO2 22 18* 17* 19* 22 27 24   GLUCOSE 99 79 137* 160* 88 98 79  BUN 103* 106* 119* 124* 86* 54* 68*  CREATININE 5.09* 5.09* 5.13* 5.08* 4.08* 3.17* 3.75*  CALCIUM 8.5* 8.5* 8.4* 8.3* 8.3* 8.4* 7.9*  PHOS  --  8.8*  --  8.4*  --   --  5.4*   CBC Recent Labs  Lab 03/11/18 1000 03/11/18 1601 03/12/18 0818 03/13/18 0751 03/14/18 0921  WBC 28.4* 25.8* 23.8* 19.8* 18.5*  NEUTROABS 25.9*  --  21.4* 17.0* 13.1*  HGB 9.9* 9.8* 10.1* 10.4* 9.2*  HCT 30.6* 30.1* 32.7* 33.9* 29.5*  MCV 81.8 81.8 84.3 86.5 86.8  PLT 291 262 270 330 331    Medications:    . acidophilus  1 capsule Oral Daily  . calcitRIOL  0.25 mcg Oral Q M,W,F  . famotidine  20 mg Oral Daily  . feeding supplement  1 Container Oral TID BM  . folic acid  1 mg Oral Daily  . hydrALAZINE  50 mg Oral TID  . lanthanum  500 mg Oral TID WC  . levothyroxine  75 mcg Oral QAC breakfast  . methylPREDNISolone  4 mg Oral Daily  . metoprolol tartrate  100 mg Oral BID  . multivitamin  1 tablet Oral QHS  . sodium chloride flush  3 mL Intravenous Q12H  . Warfarin - Pharmacist Dosing Inpatient  Does not apply q1800   Bufford Buttner, MD [facility] pgr [phone] 03/14/2018, 11:48 AM

## 2018-03-14 NOTE — Progress Notes (Signed)
No more episodes of a-fib. We will sign off, call us with any questions.   Tobias Alexander, MD 03/14/2018

## 2018-03-14 NOTE — Progress Notes (Signed)
ANTICOAGULATION CONSULT NOTE - Follow Up Consult  Pharmacy Consult for warfarin Indication: atrial fibrillation  Allergies  Allergen Reactions  . Tape Other (See Comments)    Tears skin - please use paper tape  . Levaquin [Levofloxacin In D5w] Other (See Comments)    Per patient this caused insomnia     Patient Measurements: Height: 5' (152.4 cm) Weight: 124 lb 1.9 oz (56.3 kg) IBW/kg (Calculated) : 45.5   Vital Signs: Temp: 97.8 F (36.6 C) (04/15 0935) Temp Source: Oral (04/15 0935) BP: 136/57 (04/15 0935) Pulse Rate: 72 (04/15 0935)  Labs: Recent Labs    03/12/18 0818 03/13/18 0751 03/14/18 0921 03/14/18 1027  HGB 10.1* 10.4* 9.2*  --   HCT 32.7* 33.9* 29.5*  --   PLT 270 330 331  --   LABPROT 25.7* 23.0*  --  25.3*  INR 2.37 2.06  --  2.32  CREATININE 4.08* 3.17* 3.75*  --     Estimated Creatinine Clearance: 9.1 mL/min (A) (by C-G formula based on SCr of 3.75 mg/dL (H)).   Medications:  Scheduled:  . acidophilus  1 capsule Oral Daily  . calcitRIOL  0.25 mcg Oral Q M,W,F  . famotidine  20 mg Oral Daily  . feeding supplement  1 Container Oral TID BM  . folic acid  1 mg Oral Daily  . hydrALAZINE  50 mg Oral TID  . lanthanum  500 mg Oral TID WC  . levothyroxine  75 mcg Oral QAC breakfast  . methylPREDNISolone  4 mg Oral Daily  . metoprolol tartrate  100 mg Oral BID  . multivitamin  1 tablet Oral QHS  . sodium chloride flush  3 mL Intravenous Q12H  . Warfarin - Pharmacist Dosing Inpatient   Does not apply q1800   Infusions:  . sodium chloride      Assessment: Rachel Johns transferred from [facility] to initiate dialysis. At [facility] she was started on warfarin/heparin bridge for new Afib. INR remains therapeutic today at 2.32 despite held doses 4/10-4/11 for possible HD cath placement. CBC is stable, no bleeding noted.  Consider regimen similar to 2.5 mg most days, 3.75 mg 1-2x per week  Goal of Therapy:  INR 2-3 Monitor platelets by anticoagulation  protocol: Yes   Plan:  Warfarin 2.5 mg PO x 1 Daily INR, CBC Monitor s/sx of bleeding   Ameshia Pewitt A. Jeanella Craze, PharmD, BCPS Clinical Pharmacist [place] Pager: [phone]  Phone: [phone] After 3:30PM please call Main Pharmacy [phone] 03/14/2018,1:00 PM

## 2018-03-14 NOTE — Progress Notes (Signed)
Pt  In HD unit. Pt AVF cannulated arterial needl with 16g needle without problem but the  venous needdle infiltrated . Dr Signe Colt notified and order received as follow: send pt back to her room and come back. to do HD tomorrow. Report given to [employer]. Pt sent to her room in stable condition.ice pack applied.

## 2018-03-14 NOTE — Progress Notes (Signed)
Occupational Therapy Treatment Patient Details Name: Rachel Johns MRN: [id number] DOB: [birth year] Today's Date: 03/14/2018    History of present illness Pt is an 84 y/o female admitted secondary to SOB and found to be in a-fib with RVR. PMH including but not limited to CHF, HTN and CKD.   OT comments  Pt making progress with functional goals, however continues to demonstrate generalized weakness and fatigues easily. OT will continue to follow acutely  Follow Up Recommendations  SNF;Supervision/Assistance - 24 hour    Equipment Recommendations       Recommendations for Other Services      Precautions / Restrictions Precautions Precautions: Fall Restrictions Weight Bearing Restrictions: No       Mobility Bed Mobility Overal bed mobility: Needs Assistance Bed Mobility: Supine to Sit;Sit to Supine     Supine to sit: Min assist;HOB elevated Sit to supine: Min assist   General bed mobility comments: increased time and effort. HHA to powerup trunk. Assist with LEs back onto bed  Transfers Overall transfer level: Needs assistance Equipment used: None;Rolling walker (2 wheeled) Transfers: Sit to/from [employer] Sit to Stand: Min guard Stand pivot transfers: Min guard            Balance Overall balance assessment: Needs assistance Sitting-balance support: Bilateral upper extremity supported;Feet supported Sitting balance-Leahy Scale: Fair     Standing balance support: During functional activity;Single extremity supported;Bilateral upper extremity supported Standing balance-Leahy Scale: Poor                             ADL either performed or assessed with clinical judgement   ADL Overall ADL's : Needs assistance/impaired                                             Vision Baseline Vision/History: Wears glasses Patient Visual Report: No change from baseline     Perception     Praxis      Cognition  Arousal/Alertness: Awake/alert Behavior During Therapy: WFL for tasks assessed/performed Overall Cognitive Status: No family/caregiver present to determine baseline cognitive functioning Area of Impairment: Attention;Memory;Following commands;Safety/judgement;Problem solving                     Memory: Decreased short-term memory;Decreased recall of precautions   Safety/Judgement: Decreased awareness of deficits;Decreased awareness of safety   Problem Solving: Difficulty sequencing;Requires verbal cues          Exercises     Shoulder Instructions       General Comments      Pertinent Vitals/ Pain       Pain Assessment: No/denies pain Faces Pain Scale: No hurt Pain Intervention(s): Monitored during session  Home Living                                          Prior Functioning/Environment              Frequency  Min 2X/week        Progress Toward Goals  OT Goals(current goals can now be found in the care plan section)  Progress towards OT goals: Progressing toward goals     Plan Discharge plan remains appropriate    Co-evaluation  AM-PAC PT "6 Clicks" Daily Activity     Outcome Measure   Help from another person eating meals?: A Little Help from another person taking care of personal grooming?: A Little Help from another person toileting, which includes using toliet, bedpan, or urinal?: A Little Help from another person bathing (including washing, rinsing, drying)?: A Little Help from another person to put on and taking off regular upper body clothing?: A Little Help from another person to put on and taking off regular lower body clothing?: A Little 6 Click Score: 18    End of Session Equipment Utilized During Treatment: Gait belt;Rolling walker  OT Visit Diagnosis: Unsteadiness on feet (R26.81);Muscle weakness (generalized) (M62.81);Pain   Activity Tolerance Patient limited by fatigue   Patient  Left in bed;with call bell/phone within reach   Nurse Communication      Functional Assessment Tool Used: AM-PAC 6 Clicks Daily Activity   Time: 4782-9562 OT Time Calculation (min): 26 min  Charges: OT G-codes **NOT FOR INPATIENT CLASS** Functional Assessment Tool Used: AM-PAC 6 Clicks Daily Activity OT General Charges $OT Visit: 1 Visit OT Treatments $Self Care/Home Management : 8-22 mins $Therapeutic Activity: 8-22 mins     Galen Manila 03/14/2018, 2:18 PM

## 2018-03-14 NOTE — Care Management Important Message (Signed)
Important Message  Patient Details  Name: Rachel Johns MRN: [id number] Date of Birth: [birth year]   Medicare Important Message Given:  No  Patient decline the Medicare rights.   Cartina Brousseau 03/14/2018, 12:55 PM

## 2018-03-14 NOTE — Progress Notes (Signed)
Patient ID: Rachel Johns, female   DOB: August 05, 1934, 84 y.o.   MRN: [id number]  PROGRESS NOTE    Rachel Johns  JGG:[id number] DOB: [birth year] DOA: 03/09/2018 PCP: Casper Harrison Stephanie Coup, MD   Brief Narrative:  84 year old female with history of hypertension, CKD stage IV, RA, hypothyroidism, CHF with last EF of 40-45% presented with worsening shortness of breath, worsening lower extremity edema and 7 pound weight gain to [facility] on 03/02/2018.  She was found to be in atrial fibrillation with rapid ventricular rate and probably decompensated diastolic heart failure.  She was started on intravenous Lasix, intravenous Cardizem and IV heparin.  She spontaneously converted to sinus rhythm however further hospital course was complicated with worsening renal function, persistent hyponatremia and leukocytosis.  Patient was transferred to [facility] on 03/09/2018 per nephrology request for need for probable initiation of dialysis.  Cardiology was also consulted.  Patient has been started on dialysis.   Assessment & Plan:   Principal Problem:   Acute kidney injury superimposed on chronic kidney disease (HCC) Active Problems:   Rheumatoid arthritis (HCC)   Normocytic anemia   Leukocytosis   Hyponatremia   Fluid overload   Pressure injury of skin   AF (paroxysmal atrial fibrillation) (HCC)   Acute combined systolic and diastolic CHF, NYHA class 3 (HCC)   PFO (patent foramen ovale)   Protein-calorie malnutrition, severe   End-stage renal disease now started on hemodialysis/acute kidney injury on chronic kidney disease stage IV -Nephrology following.  Patient was started on dialysis on 03/11/2018 -Patient was supposed to have dialysis today but had issues with cannulation.  Patient will have dialysis again tomorrow.  Paroxysmal atrial fibrillation with rapid ventricular response -Initially treated with Cardizem drip -Currently rate better controlled -Cardiology following.   Continue metoprolol. -Continue Coumadin.  Monitor INR  Persistent leukocytosis -Probably reactive from above along with steroid use. -Patient was initially started on Rocephin and Zithromax, however leukocytosis continue to worsen with negative blood cultures.  CT of the chest and abdomen did not show any obvious infection.  Antibiotics were changed to cefepime at outside hospital. -Dr. Toula Moos with Hematology-Dr. Ferol Luz reviewed peripheral smear-no evidence of immature cells/chronic leukemia-per Hematology-peripheral smear consistent with a infectious process.  -Improving.  Cefepime was discontinued on 03/12/2018.  Repeat a.m. Labs  Acute on chronic systolic and diastolic heart failure -Has been started on dialysis. -Continue beta-blocker, hydralazine. - Cardiology following -Echo on 03/03/2018 showed some reduction in EF to 40-45% and severe tricuspid regurgitation with large PFO.  Hyponatremia -Probably secondary to volume overload -Improving  Hyperkalemia -Resolved.  Repeat a.m. labs  Hypothyroidism -Continue Synthroid  Essential hypertension -Monitor blood pressure.  Continue metoprolol and hydralazine.  Rheumatoid arthritis -Steroid dependent.  Continue methylprednisone.  Anemia:  -Stable.  Gout:  - No evidence of flare-Continue allopurinol    DVT prophylaxis: Coumadin Code Status: Full Family Communication: None at bedside Disposition Plan: Probable discharge home in the next few days once cleared by nephrology and once outpatient dialysis has been set up.  Consultants: Nephrology/cardiology  Procedures: None  Antimicrobials: Cefepime discontinued on 03/12/2018   Subjective: Patient seen and examined at bedside.  No overnight fever, nausea or vomiting.  She does not feel that well today.  No chest pain or worsening shortness of breath.  Objective: Vitals:   03/13/18 2156 03/14/18 0104 03/14/18 0530 03/14/18 0935  BP: (!) 143/68  (!) 145/72 (!)  136/57  Pulse: 70  62 72  Resp: 16  18  20  Temp: 98.6 F (37 C)  97.6 F (36.4 C) 97.8 F (36.6 C)  TempSrc: Oral  Oral Oral  SpO2: 93%  92% 99%  Weight: 56.3 kg (124 lb 1.9 oz) 56.3 kg (124 lb 1.9 oz)    Height:        Intake/Output Summary (Last 24 hours) at 03/14/2018 1111 Last data filed at 03/14/2018 0600 Gross per 24 hour  Intake 240 ml  Output 0 ml  Net 240 ml   Filed Weights   03/13/18 0500 03/13/18 2156 03/14/18 0104  Weight: 56.3 kg (124 lb 1.9 oz) 56.3 kg (124 lb 1.9 oz) 56.3 kg (124 lb 1.9 oz)    Examination:  General exam: Elderly female sitting in bed.  No distress.  Awake.   Respiratory system: Bilateral decreased breath sounds at bases  cardiovascular system: Rate controlled, S1-S2 positive  gastrointestinal system: Abdomen is nondistended, soft and nontender. Normal bowel sounds heard. Extremities: No cyanosis, clubbing; 1-2+ edema    Data Reviewed: I have personally reviewed following labs and imaging studies  CBC: Recent Labs  Lab 03/10/18 0850 03/11/18 1000 03/11/18 1601 03/12/18 0818 03/13/18 0751 03/14/18 0921  WBC 24.9* 28.4* 25.8* 23.8* 19.8* 18.5*  NEUTROABS 21.9* 25.9*  --  21.4* 17.0* 13.1*  HGB 9.4* 9.9* 9.8* 10.1* 10.4* 9.2*  HCT 29.3* 30.6* 30.1* 32.7* 33.9* 29.5*  MCV 83.5 81.8 81.8 84.3 86.5 86.8  PLT 258 291 262 270 330 331   Basic Metabolic Panel: Recent Labs  Lab 03/09/18 2259 03/10/18 0850 03/11/18 1000 03/11/18 1414 03/12/18 0818 03/13/18 0751 03/14/18 0921  NA 126* 125* 125* 125* 129* 135 132*  K 4.2 4.1 3.6 3.5 4.0 3.6 3.8  CL 85* 86* 85* 84* 89* 93* 93*  CO2 22 18* 17* 19* 22 27 24   GLUCOSE 99 79 137* 160* 88 98 79  BUN 103* 106* 119* 124* 86* 54* 68*  CREATININE 5.09* 5.09* 5.13* 5.08* 4.08* 3.17* 3.75*  CALCIUM 8.5* 8.5* 8.4* 8.3* 8.3* 8.4* 7.9*  MG 1.9  --  1.9  --  2.1 2.0 1.9  PHOS  --  8.8*  --  8.4*  --   --  5.4*   GFR: Estimated Creatinine Clearance: 9.1 mL/min (A) (by C-G formula based on SCr of  3.75 mg/dL (H)). Liver Function Tests: Recent Labs  Lab 03/09/18 2259 03/10/18 0850 03/11/18 1414 03/14/18 0921  AST 22  --   --   --   ALT 81*  --   --   --   ALKPHOS 206*  --   --   --   BILITOT 0.6  --   --   --   PROT 5.3*  --   --   --   ALBUMIN 2.3* 2.2* 2.1* 2.1*   No results for input(s): LIPASE, AMYLASE in the last 168 hours. No results for input(s): AMMONIA in the last 168 hours. Coagulation Profile: Recent Labs  Lab 03/09/18 2259 03/10/18 0850 03/11/18 1000 03/12/18 0818 03/13/18 0751  INR 3.15 2.87 2.47 2.37 2.06   Cardiac Enzymes: No results for input(s): CKTOTAL, CKMB, CKMBINDEX, TROPONINI in the last 168 hours. BNP (last 3 results) No results for input(s): PROBNP in the last 8760 hours. HbA1C: No results for input(s): HGBA1C in the last 72 hours. CBG: No results for input(s): GLUCAP in the last 168 hours. Lipid Profile: No results for input(s): CHOL, HDL, LDLCALC, TRIG, CHOLHDL, LDLDIRECT in the last 72 hours. Thyroid Function Tests: No results for input(s): TSH,  T4TOTAL, FREET4, T3FREE, THYROIDAB in the last 72 hours. Anemia Panel: Recent Labs    03/13/18 1113  FERRITIN 233  TIBC 209*  IRON 59   Sepsis Labs: No results for input(s): PROCALCITON, LATICACIDVEN in the last 168 hours.  Recent Results (from the past 240 hour(s))  MRSA PCR Screening     Status: None   Collection Time: 03/09/18 11:02 PM  Result Value Ref Range Status   MRSA by PCR NEGATIVE NEGATIVE Final    Comment:        The GeneXpert MRSA Assay (FDA approved for NASAL specimens only), is one component of a comprehensive MRSA colonization surveillance program. It is not intended to diagnose MRSA infection nor to guide or monitor treatment for MRSA infections. Performed at [facility], [address] [place], [place] [address]          Radiology Studies: No results found.      Scheduled Meds: . acidophilus  1 capsule Oral Daily  . calcitRIOL  0.25 mcg  Oral Q M,W,F  . famotidine  20 mg Oral Daily  . feeding supplement  1 Container Oral TID BM  . folic acid  1 mg Oral Daily  . hydrALAZINE  50 mg Oral TID  . lanthanum  500 mg Oral TID WC  . levothyroxine  75 mcg Oral QAC breakfast  . methylPREDNISolone  4 mg Oral Daily  . metoprolol tartrate  100 mg Oral BID  . multivitamin  1 tablet Oral QHS  . sodium chloride flush  3 mL Intravenous Q12H  . Warfarin - Pharmacist Dosing Inpatient   Does not apply q1800   Continuous Infusions: . sodium chloride       LOS: 5 days        Glade Lloyd, MD [facility] Pager [phone]  If 7PM-7AM, please contact night-coverage [email or web address] Password [email or web address] 03/14/2018, 11:11 AM

## 2018-03-14 NOTE — Progress Notes (Addendum)
Physical Therapy Treatment Patient Details Name: Rachel Johns MRN: [id number] DOB: [birth year] Today's Date: 03/14/2018    History of Present Illness Pt is an 84 y.o. female admitted 03/09/18 secondary to SOB; found to be in a-fib with RVR. Worked up for AKI on superimposed on CKD; HD initiated 4/12 via LUE AVG. Also with decompensated systolic HF. PMH includes CHF, HTN, CKD.   PT Comments    Pt progressing with mobility. Able to amb 44' with RW and min guard for balance; performed multiple sit<>stands with min guard for balance, but limited by c/o R shoulder "arthritis" pain. Demonstrates decreased safety awareness and problem solving, but seems more cognitively appropriate compared to initial PT evaluation. Continue to recommend SNF-level therapies to maximize functional mobility and independence. Will follow acutely.   Follow Up Recommendations  SNF;Supervision/Assistance - 24 hour     Equipment Recommendations  (TBD next venue)    Recommendations for Other Services       Precautions / Restrictions Precautions Precautions: Fall Restrictions Weight Bearing Restrictions: No    Mobility  Bed Mobility Overal bed mobility: Needs Assistance Bed Mobility: Supine to Sit;Sit to Supine     Supine to sit: Min assist;HOB elevated Sit to supine: Min assist   General bed mobility comments: Received using toilet with NT present  Transfers Overall transfer level: Needs assistance Equipment used: Rolling walker (2 wheeled) Transfers: Sit to/from Stand Sit to Stand: Min guard Stand pivot transfers: Min guard       General transfer comment: Stood 1x from toilet and 2x from recliner with RW and min guard for balance. Heavy reliance on BUE support to push into standing; c/o R shoulder pain  Ambulation/Gait Ambulation/Gait assistance: Min guard Ambulation Distance (Feet): 80 Feet Assistive device: Rolling walker (2 wheeled) Gait Pattern/deviations: Step-through pattern;Decreased  stride length;Trunk flexed Gait velocity: Decreased   General Gait Details: Amb 20' in room with seated rest break. Amb an additional 48' with RW and close min guard for balance; DOE 2/4   Stairs             Wheelchair Mobility    Modified Rankin (Stroke Patients Only)       Balance Overall balance assessment: Needs assistance Sitting-balance support: Bilateral upper extremity supported;Feet supported Sitting balance-Leahy Scale: Fair     Standing balance support: During functional activity;Single extremity supported;Bilateral upper extremity supported Standing balance-Leahy Scale: Poor Standing balance comment: Reliant on UE support                            Cognition Arousal/Alertness: Awake/alert Behavior During Therapy: WFL for tasks assessed/performed Overall Cognitive Status: No family/caregiver present to determine baseline cognitive functioning Area of Impairment: Attention;Following commands;Safety/judgement;Problem solving                   Current Attention Level: Selective Memory: Decreased short-term memory;Decreased recall of precautions Following Commands: Follows one step commands consistently;Follows multi-step commands with increased time Safety/Judgement: Decreased awareness of deficits;Decreased awareness of safety   Problem Solving: Difficulty sequencing;Requires verbal cues        Exercises      General Comments        Pertinent Vitals/Pain Pain Assessment: Faces Faces Pain Scale: Hurts little more Pain Location: R shoulder "arthritis" Pain Descriptors / Indicators: Grimacing;Guarding Pain Intervention(s): Monitored during session;Premedicated before session    Home Living  Prior Function            PT Goals (current goals can now be found in the care plan section) Acute Rehab PT Goals Patient Stated Goal: Get stronger PT Goal Formulation: With patient Time For Goal  Achievement: 03/24/18 Potential to Achieve Goals: Good Progress towards PT goals: Progressing toward goals    Frequency    Min 3X/week      PT Plan Current plan remains appropriate    Co-evaluation              AM-PAC PT "6 Clicks" Daily Activity  Outcome Measure  Difficulty turning over in bed (including adjusting bedclothes, sheets and blankets)?: None Difficulty moving from lying on back to sitting on the side of the bed? : Unable Difficulty sitting down on and standing up from a chair with arms (e.g., wheelchair, bedside commode, etc,.)?: A Little Help needed moving to and from a bed to chair (including a wheelchair)?: A Little Help needed walking in hospital room?: A Little Help needed climbing 3-5 steps with a railing? : A Lot 6 Click Score: 16    End of Session Equipment Utilized During Treatment: Gait belt Activity Tolerance: Patient tolerated treatment well;Patient limited by fatigue Patient left: in chair;with call bell/phone within reach Nurse Communication: Mobility status PT Visit Diagnosis: Other abnormalities of gait and mobility (R26.89);Muscle weakness (generalized) (M62.81)     Time: 4259-5638 PT Time Calculation (min) (ACUTE ONLY): 19 min  Charges:  $Therapeutic Activity: 8-22 mins                    G Codes:      Ina Homes, PT, DPT Acute Rehab Services  Pager: [phone]  Malachy Chamber 03/14/2018, 4:59 PM

## 2018-03-14 NOTE — Discharge Instructions (Signed)

## 2018-03-14 NOTE — Consult Note (Signed)
            [facility] CM Primary Care Navigator  03/14/2018  Rachel Johns 18-Feb-1934 [id number]  Seenpatientat the bedside toidentify possible discharge needs. Patientreportshaving "no energy (weak), worsening shortness of breath and lower extremity edema" that had led tothis admission.  PatientendorsesDr.Christopher Street with [facility] as her primary care provider.   [facility]verbalized [facility] [address] [employer], advertising and [employer] Order service to obtain medications without difficulty.   Patientreports thatshe has beenmanaginghermedications at home straight out of the containers.  Patient states thatshe was driving prior to admission but son [name]- only child) will providetransportation toherdoctors'appointments if needed after discharge.  Patientreports living alone and being the primary caregiver for herself but her son can provide assistance with her needs when discharged.  Anticipated plan for discharge isskilled nursing facility (SNF) per therapy recommendation.   Patientvoiced understanding to call primarycareprovider's office when she returns back home,for a post discharge follow-upvisitwithin1- 2 weeksor sooner if needs arise.Patient letter (with PCP's contact number) was provided asareminder.   Discussed with patientregarding [facility] CM services available for health management/ resources at home and she indicated interest for it.  Patient plans to discuss with primary care provider on her next visit, for further health management services thatshewillbe needing- once discharged home.  Patient expressed understandingof need to seekreferral from primary care provider to [facility] care management ifdeemed necessary and appropriatefor anyservices in the nearfuture.   [facility] care management information was provided for future needs thatshe may have.  Primary care provider's office is listed as  providing transition of care (TOC) follow-up.   For additional questions please contact:  Karin Golden A. Shemekia Patane, BSN, RN-BC [facility] PRIMARY CARE Navigator Cell: [phone]

## 2018-03-15 LAB — RENAL FUNCTION PANEL
ANION GAP: 15 (ref 5–15)
Albumin: 2.1 g/dL — ABNORMAL LOW (ref 3.5–5.0)
BUN: 89 mg/dL — ABNORMAL HIGH (ref 6–20)
CO2: 22 mmol/L (ref 22–32)
Calcium: 7.9 mg/dL — ABNORMAL LOW (ref 8.9–10.3)
Chloride: 93 mmol/L — ABNORMAL LOW (ref 101–111)
Creatinine, Ser: 4.23 mg/dL — ABNORMAL HIGH (ref 0.44–1.00)
GFR calc Af Amer: 10 mL/min — ABNORMAL LOW (ref >60)
GFR calc non Af Amer: 9 mL/min — ABNORMAL LOW (ref >60)
GLUCOSE: 101 mg/dL — AB (ref 65–99)
POTASSIUM: 3.9 mmol/L (ref 3.5–5.1)
Phosphorus: 6.6 mg/dL — ABNORMAL HIGH (ref 2.5–4.6)
Sodium: 130 mmol/L — ABNORMAL LOW (ref 135–145)

## 2018-03-15 LAB — CBC
HCT: 30.6 % — ABNORMAL LOW (ref 36.0–46.0)
Hemoglobin: 9.3 g/dL — ABNORMAL LOW (ref 12.0–15.0)
MCH: 26.1 pg (ref 26.0–34.0)
MCHC: 30.4 g/dL (ref 30.0–36.0)
MCV: 86 fL (ref 78.0–100.0)
Platelets: 374 10*3/uL (ref 150–400)
RBC: 3.56 MIL/uL — ABNORMAL LOW (ref 3.87–5.11)
RDW: 18.2 % — ABNORMAL HIGH (ref 11.5–15.5)
WBC: 22 10*3/uL — ABNORMAL HIGH (ref 4.0–10.5)

## 2018-03-15 LAB — PROTIME-INR
INR: 2.89
Prothrombin Time: 30 seconds — ABNORMAL HIGH (ref 11.4–15.2)

## 2018-03-15 MED ORDER — CALCITRIOL 0.25 MCG PO CAPS
ORAL_CAPSULE | ORAL | Status: AC
  Filled 2018-03-15: qty 1

## 2018-03-15 MED ORDER — DARBEPOETIN ALFA 60 MCG/0.3ML IJ SOSY
PREFILLED_SYRINGE | INTRAMUSCULAR | Status: AC
  Filled 2018-03-15: qty 0.3

## 2018-03-15 MED ORDER — WARFARIN SODIUM 2.5 MG PO TABS
2.5000 mg | ORAL_TABLET | Freq: Once | ORAL | Status: AC
  Administered 2018-03-15: 2.5 mg via ORAL
  Filled 2018-03-15: qty 1

## 2018-03-15 MED ORDER — DARBEPOETIN ALFA 60 MCG/0.3ML IJ SOSY
60.0000 ug | PREFILLED_SYRINGE | INTRAMUSCULAR | Status: DC
  Administered 2018-03-15: 60 ug via INTRAVENOUS

## 2018-03-15 NOTE — Progress Notes (Signed)
Physical Therapy Treatment Patient Details Name: Rachel Johns MRN: [id number] DOB: 10-05-1934 Today's Date: 03/15/2018    History of Present Illness Pt is an 84 y.o. female admitted 03/09/18 secondary to SOB; found to be in a-fib with RVR. Worked up for AKI on superimposed on CKD; HD initiated 4/12 via LUE AVG. Also with decompensated systolic HF. PMH includes CHF, HTN, CKD.   PT Comments    Pt limited by c/o fatigue and feeling especially weak this morning, but moving better overall. Main limitation at this point seems to be c/o chronic R shoulder arthritic pain (pre-medicated prior to session) and decreased activity tolerance. Amb 65' with RW and min guard; declining further distance secondary to wanting breakfast that had not arrived yet. Continue to recommend SNF-level therapies.   Follow Up Recommendations  SNF;Supervision/Assistance - 24 hour     Equipment Recommendations  (TBD next venue)    Recommendations for Other Services       Precautions / Restrictions Precautions Precautions: Fall Restrictions Weight Bearing Restrictions: No    Mobility  Bed Mobility Overal bed mobility: Needs Assistance Bed Mobility: Supine to Sit     Supine to sit: Min guard;HOB elevated        Transfers Overall transfer level: Needs assistance Equipment used: Rolling walker (2 wheeled) Transfers: Sit to/from Stand Sit to Stand: Min assist         General transfer comment: MinA and EOB elevated secondary to c/o R shoulder pain   Ambulation/Gait Ambulation/Gait assistance: Min guard Ambulation Distance (Feet): 70 Feet Assistive device: Rolling walker (2 wheeled) Gait Pattern/deviations: Step-through pattern;Decreased stride length;Trunk flexed Gait velocity: Decreased   General Gait Details: Decreased ambulation distance this session secondary to pt stating "I'm just too weak, I can't" and wanting breakfast (although informed breakfast not delivered yet). Decreased safety  awareness with RW, specifically stepping outside of it during turns   Social research officer, government Rankin (Stroke Patients Only)       Balance Overall balance assessment: Needs assistance Sitting-balance support: Bilateral upper extremity supported;Feet supported Sitting balance-Leahy Scale: Fair     Standing balance support: During functional activity;Single extremity supported;Bilateral upper extremity supported Standing balance-Leahy Scale: Poor Standing balance comment: Reliant on UE support                            Cognition Arousal/Alertness: Awake/alert Behavior During Therapy: WFL for tasks assessed/performed Overall Cognitive Status: No family/caregiver present to determine baseline cognitive functioning Area of Impairment: Attention;Following commands;Safety/judgement;Problem solving                   Current Attention Level: Selective Memory: Decreased short-term memory;Decreased recall of precautions Following Commands: Follows one step commands consistently;Follows multi-step commands with increased time Safety/Judgement: Decreased awareness of deficits;Decreased awareness of safety   Problem Solving: Difficulty sequencing;Requires verbal cues General Comments: Pt distracted entire session by the fact she was "starving," "needed breakfast," and "I'm so weak." Unable to be redirected from this despite education on importance of mobility and working with PT      Exercises      General Comments        Pertinent Vitals/Pain Pain Assessment: Faces Faces Pain Scale: Hurts even more Pain Location: R shoulder "arthritis" Pain Descriptors / Indicators: Grimacing;Guarding Pain Intervention(s): Monitored during session;Premedicated before session    Home Living  Prior Function            PT Goals (current goals can now be found in the care plan section) Acute Rehab PT  Goals Patient Stated Goal: Get stronger PT Goal Formulation: With patient Time For Goal Achievement: 03/24/18 Potential to Achieve Goals: Good Progress towards PT goals: Progressing toward goals    Frequency    Min 3X/week      PT Plan Current plan remains appropriate    Co-evaluation              AM-PAC PT "6 Clicks" Daily Activity  Outcome Measure  Difficulty turning over in bed (including adjusting bedclothes, sheets and blankets)?: None Difficulty moving from lying on back to sitting on the side of the bed? : A Little Difficulty sitting down on and standing up from a chair with arms (e.g., wheelchair, bedside commode, etc,.)?: Unable Help needed moving to and from a bed to chair (including a wheelchair)?: A Little Help needed walking in hospital room?: A Little Help needed climbing 3-5 steps with a railing? : A Lot 6 Click Score: 16    End of Session Equipment Utilized During Treatment: Gait belt Activity Tolerance: Patient limited by fatigue;Patient limited by pain Patient left: in chair;with call bell/phone within reach Nurse Communication: Mobility status PT Visit Diagnosis: Other abnormalities of gait and mobility (R26.89);Muscle weakness (generalized) (M62.81)     Time: 0981-1914 PT Time Calculation (min) (ACUTE ONLY): 12 min  Charges:  $Therapeutic Activity: 8-22 mins                    G Codes:      Ina Homes, PT, DPT Acute Rehab Services  Pager: [phone]  Malachy Chamber 03/15/2018, 8:23 AM

## 2018-03-15 NOTE — Progress Notes (Signed)
ANTICOAGULATION CONSULT NOTE - Follow Up Consult  Pharmacy Consult for warfarin Indication: atrial fibrillation  Allergies  Allergen Reactions  . Tape Other (See Comments)    Tears skin - please use paper tape  . Levaquin [Levofloxacin In D5w] Other (See Comments)    Per patient this caused insomnia     Patient Measurements: Height: 5' (152.4 cm) Weight: 124 lb 5.4 oz (56.4 kg) IBW/kg (Calculated) : 45.5   Vital Signs: Temp: 97.7 F (36.5 C) (04/16 0938) Temp Source: Oral (04/16 0938) BP: 126/58 (04/16 0957) Pulse Rate: 60 (04/16 0957)  Labs: Recent Labs    03/13/18 0751 03/14/18 0921 03/14/18 1027 03/15/18 0627  HGB 10.4* 9.2*  --   --   HCT 33.9* 29.5*  --   --   PLT 330 331  --   --   LABPROT 23.0*  --  25.3* 30.0*  INR 2.06  --  2.32 2.89  CREATININE 3.17* 3.75*  --   --     Estimated Creatinine Clearance: 9.1 mL/min (A) (by C-G formula based on SCr of 3.75 mg/dL (H)).   Medications:  Scheduled:  . acidophilus  1 capsule Oral Daily  . calcitRIOL  0.25 mcg Oral Q M,W,F  . famotidine  20 mg Oral Daily  . feeding supplement  1 Container Oral TID BM  . folic acid  1 mg Oral Daily  . hydrALAZINE  50 mg Oral TID  . lanthanum  500 mg Oral TID WC  . levothyroxine  75 mcg Oral QAC breakfast  . methylPREDNISolone  4 mg Oral Daily  . metoprolol tartrate  100 mg Oral BID  . multivitamin  1 tablet Oral QHS  . sodium chloride flush  3 mL Intravenous Q12H  . Warfarin - Pharmacist Dosing Inpatient   Does not apply q1800   Infusions:  . sodium chloride      Assessment: 84 YOF transferred from [facility] to initiate dialysis. At [facility] she was started on warfarin/heparin bridge for new Afib. INR remains therapeutic today at 2.89. CBC is stable, no bleeding noted.  Consider regimen similar to 2.5 mg most days, 3.75 mg 1-2x per week  Goal of Therapy:  INR 2-3 Monitor platelets by anticoagulation protocol: Yes   Plan:  Warfarin 2.5 mg PO x 1 Daily INR,  CBC Monitor s/sx of bleeding   Gabrial Poppell A. Jeanella Craze, PharmD, BCPS Clinical Pharmacist [place] Pager: [phone]  Phone: [phone] After 3:30PM please call Main Pharmacy [phone] 03/15/2018,10:35 AM

## 2018-03-15 NOTE — Progress Notes (Signed)
Patient ID: Rachel Johns, female   DOB: Sep 12, 1934, 84 y.o.   MRN: [id number] [facility] Progress Note   Assessment/ Plan:    1.    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  HD #3 today.  If repeated infiltration, will need TDC to rest AVG (placed 3/18 by Dr Darrick Penna)  2.  Acute exacerbation of diastolic heart failure: Was refractory to diuretic therapy at [facility] and transferred to [facility] for initiation of hemodialysis-appears closer to euvolemia  3.  Hypertension: Blood pressure appears to be fairly well controlled on current therapy/HD.  4.   Nutrition:  Albumin 2.1, on protein supps  5.  Anemia of chronic kidney disease: Iron studies with Tsat 28%, ferritin 233, Hgb 9,2--> start ESA  6.  Secondary hyperparathyroidism: PTH level at goal for chronic kidney disease stage V/ESRD.  Begin phosphorus binder for hyperphosphatemia- fosrenol and is also on calcitriol  Subjective:    NAD today, her covers are "all messed up" this AM- tangled in her blankets.     Objective:   BP (!) 126/58 (BP Location: Right Arm)   Pulse 60   Temp 97.7 F (36.5 C) (Oral)   Resp 15   Ht 5' (1.524 m)   Wt 56.4 kg (124 lb 5.4 oz)   SpO2 99%   BMI 24.28 kg/m   Intake/Output Summary (Last 24 hours) at 03/15/2018 1344 Last data filed at 03/15/2018 0937 Gross per 24 hour  Intake 240 ml  Output 400 ml  Net -160 ml   Weight change: 0.1 kg (3.5 oz)  Physical Exam: Gen: Comfortably resting in bed, awake/alert CVS: Irregularly irregular, normal rate, S1 and S2 normal Resp: Clear to auscultation, no rales/rhonchi. Abd: Soft, flat, nontender Ext: 1+ ankle edema bilaterally.  Left upper arm AVG with thrill, some ecchymosis from prior infiltration, much better today.    Imaging: No results found.  Labs: BMET Recent Labs  Lab 03/09/18 2259 03/10/18 0850 03/11/18 1000  03/11/18 1414 03/12/18 0818 03/13/18 0751 03/14/18 0921  NA 126* 125* 125* 125* 129* 135 132*  K 4.2 4.1 3.6 3.5 4.0 3.6 3.8  CL 85* 86* 85* 84* 89* 93* 93*  CO2 22 18* 17* 19* 22 27 24   GLUCOSE 99 79 137* 160* 88 98 79  BUN 103* 106* 119* 124* 86* 54* 68*  CREATININE 5.09* 5.09* 5.13* 5.08* 4.08* 3.17* 3.75*  CALCIUM 8.5* 8.5* 8.4* 8.3* 8.3* 8.4* 7.9*  PHOS  --  8.8*  --  8.4*  --   --  5.4*   CBC Recent Labs  Lab 03/11/18 1000 03/11/18 1601 03/12/18 0818 03/13/18 0751 03/14/18 0921  WBC 28.4* 25.8* 23.8* 19.8* 18.5*  NEUTROABS 25.9*  --  21.4* 17.0* 13.1*  HGB 9.9* 9.8* 10.1* 10.4* 9.2*  HCT 30.6* 30.1* 32.7* 33.9* 29.5*  MCV 81.8 81.8 84.3 86.5 86.8  PLT 291 262 270 330 331    Medications:    . acidophilus  1 capsule Oral Daily  . calcitRIOL  0.25 mcg Oral Q M,W,F  . famotidine  20 mg Oral Daily  . feeding supplement  1 Container Oral TID BM  . folic acid  1 mg Oral Daily  . hydrALAZINE  50 mg Oral TID  . lanthanum  500 mg Oral TID WC  . levothyroxine  75 mcg Oral QAC breakfast  . methylPREDNISolone  4 mg Oral Daily  . metoprolol tartrate  100 mg Oral BID  . multivitamin  1 tablet Oral QHS  . sodium chloride flush  3 mL Intravenous Q12H  . warfarin  2.5 mg Oral ONCE-1800  . Warfarin - Pharmacist Dosing Inpatient   Does not apply q1800   Bufford Buttner, MD [facility] pgr [phone] 03/15/2018, 1:44 PM

## 2018-03-15 NOTE — Progress Notes (Addendum)
Pt HR running in upper 120s up to upper 130s. MD made aware. Will continue to monitor.

## 2018-03-15 NOTE — Progress Notes (Signed)
Patient ID: Rachel Johns, female   DOB: 07-10-1934, 84 y.o.   MRN: [id number]  PROGRESS NOTE    Shukri Onder  IHW:[id number] DOB: 1934/07/20 DOA: 03/09/2018 PCP: Casper Harrison Stephanie Coup, MD   Brief Narrative:  84 year old female with history of hypertension, CKD stage IV, RA, hypothyroidism, CHF with last EF of 40-45% presented with worsening shortness of breath, worsening lower extremity edema and 7 pound weight gain to [facility] on 03/02/2018.  She was found to be in atrial fibrillation with rapid ventricular rate and probably decompensated diastolic heart failure.  She was started on intravenous Lasix, intravenous Cardizem and IV heparin.  She spontaneously converted to sinus rhythm however further hospital course was complicated with worsening renal function, persistent hyponatremia and leukocytosis.  Patient was transferred to [facility] on 03/09/2018 per nephrology request for need for probable initiation of dialysis.  Cardiology was also consulted.  Patient has been started on dialysis.   Assessment & Plan:   Principal Problem:   Acute kidney injury superimposed on chronic kidney disease (HCC) Active Problems:   Rheumatoid arthritis (HCC)   Normocytic anemia   Leukocytosis   Hyponatremia   Fluid overload   Pressure injury of skin   AF (paroxysmal atrial fibrillation) (HCC)   Acute combined systolic and diastolic CHF, NYHA class 3 (HCC)   PFO (patent foramen ovale)   Protein-calorie malnutrition, severe   End-stage renal disease now started on hemodialysis/acute kidney injury on chronic kidney disease stage IV -Nephrology following.  Patient was started on dialysis on 03/11/2018 -Probable dialysis again today.  Outpatient dialysis is being arranged  Paroxysmal atrial fibrillation with rapid ventricular response -Initially treated with Cardizem drip -Currently rate better controlled -Cardiology signed off.  Continue metoprolol. -Continue Coumadin.  Monitor  INR  Persistent leukocytosis -Probably reactive from above along with steroid use. -Patient was initially started on Rocephin and Zithromax, however leukocytosis continue to worsen with negative blood cultures.  CT of the chest and abdomen did not show any obvious infection.  Antibiotics were changed to cefepime at outside hospital. -Dr. Toula Moos with Hematology-Dr. Ferol Luz reviewed peripheral smear-no evidence of immature cells/chronic leukemia-per Hematology-peripheral smear consistent with a infectious process.  -Improving.  Cefepime was discontinued on 03/12/2018.  No labs available today.  Repeat a.m. labs  Acute on chronic systolic and diastolic heart failure -Has been started on dialysis. -Continue beta-blocker, hydralazine. - Cardiology signed off.  Outpatient follow-up with cardiology -Echo on 03/03/2018 showed some reduction in EF to 40-45% and severe tricuspid regurgitation with large PFO.  Hyponatremia -Probably secondary to volume overload -Improving -Repeat a.m. labs  Hyperkalemia -Resolved.  Repeat a.m. labs  Hypothyroidism -Continue Synthroid  Essential hypertension -Monitor blood pressure.  Continue metoprolol and hydralazine.  Rheumatoid arthritis -Steroid dependent.  Continue methylprednisone.  Anemia:  -Stable.  Gout:  - No evidence of flare-Continue allopurinol    DVT prophylaxis: Coumadin Code Status: Full Family Communication: None at bedside Disposition Plan: Nursing home once bed is available and once outpatient dialysis set up  Consultants: Nephrology/cardiology  Procedures: None  Antimicrobials: Cefepime discontinued on 03/12/2018   Subjective: Patient seen and examined at bedside.  No worsening shortness of breath, nausea, vomiting or fevers.  Objective: Vitals:   03/15/18 0500 03/15/18 0513 03/15/18 0938 03/15/18 0957  BP:  139/69 (!) 120/55 (!) 126/58  Pulse:  63 62 60  Resp:  17 15   Temp:  98.1 F (36.7 C) 97.7 F  (36.5 C)   TempSrc:  Oral  Oral   SpO2:  96% 99%   Weight: 56.4 kg (124 lb 5.4 oz)     Height:        Intake/Output Summary (Last 24 hours) at 03/15/2018 1127 Last data filed at 03/15/2018 1610 Gross per 24 hour  Intake 240 ml  Output 400 ml  Net -160 ml   Filed Weights   03/13/18 2156 03/14/18 0104 03/15/18 0500  Weight: 56.3 kg (124 lb 1.9 oz) 56.3 kg (124 lb 1.9 oz) 56.4 kg (124 lb 5.4 oz)    Examination:  General exam: No acute distress.  Awake and comfortable.   Respiratory system: Bilateral decreased breath sounds at bases cardiovascular system: Rate controlled, S1-S2 positive  gastrointestinal system: Abdomen is nondistended, soft and nontender. Normal bowel sounds heard. Extremities: 1+ edema, no cyanosis or clubbing    Data Reviewed: I have personally reviewed following labs and imaging studies  CBC: Recent Labs  Lab 03/10/18 0850 03/11/18 1000 03/11/18 1601 03/12/18 0818 03/13/18 0751 03/14/18 0921  WBC 24.9* 28.4* 25.8* 23.8* 19.8* 18.5*  NEUTROABS 21.9* 25.9*  --  21.4* 17.0* 13.1*  HGB 9.4* 9.9* 9.8* 10.1* 10.4* 9.2*  HCT 29.3* 30.6* 30.1* 32.7* 33.9* 29.5*  MCV 83.5 81.8 81.8 84.3 86.5 86.8  PLT 258 291 262 270 330 331   Basic Metabolic Panel: Recent Labs  Lab 03/09/18 2259 03/10/18 0850 03/11/18 1000 03/11/18 1414 03/12/18 0818 03/13/18 0751 03/14/18 0921  NA 126* 125* 125* 125* 129* 135 132*  K 4.2 4.1 3.6 3.5 4.0 3.6 3.8  CL 85* 86* 85* 84* 89* 93* 93*  CO2 22 18* 17* 19* 22 27 24   GLUCOSE 99 79 137* 160* 88 98 79  BUN 103* 106* 119* 124* 86* 54* 68*  CREATININE 5.09* 5.09* 5.13* 5.08* 4.08* 3.17* 3.75*  CALCIUM 8.5* 8.5* 8.4* 8.3* 8.3* 8.4* 7.9*  MG 1.9  --  1.9  --  2.1 2.0 1.9  PHOS  --  8.8*  --  8.4*  --   --  5.4*   GFR: Estimated Creatinine Clearance: 9.1 mL/min (A) (by C-G formula based on SCr of 3.75 mg/dL (H)). Liver Function Tests: Recent Labs  Lab 03/09/18 2259 03/10/18 0850 03/11/18 1414 03/14/18 0921  AST 22  --    --   --   ALT 81*  --   --   --   ALKPHOS 206*  --   --   --   BILITOT 0.6  --   --   --   PROT 5.3*  --   --   --   ALBUMIN 2.3* 2.2* 2.1* 2.1*   No results for input(s): LIPASE, AMYLASE in the last 168 hours. No results for input(s): AMMONIA in the last 168 hours. Coagulation Profile: Recent Labs  Lab 03/11/18 1000 03/12/18 0818 03/13/18 0751 03/14/18 1027 03/15/18 0627  INR 2.47 2.37 2.06 2.32 2.89   Cardiac Enzymes: No results for input(s): CKTOTAL, CKMB, CKMBINDEX, TROPONINI in the last 168 hours. BNP (last 3 results) No results for input(s): PROBNP in the last 8760 hours. HbA1C: No results for input(s): HGBA1C in the last 72 hours. CBG: No results for input(s): GLUCAP in the last 168 hours. Lipid Profile: No results for input(s): CHOL, HDL, LDLCALC, TRIG, CHOLHDL, LDLDIRECT in the last 72 hours. Thyroid Function Tests: No results for input(s): TSH, T4TOTAL, FREET4, T3FREE, THYROIDAB in the last 72 hours. Anemia Panel: Recent Labs    03/13/18 1113  FERRITIN 233  TIBC 209*  IRON 59  Sepsis Labs: No results for input(s): PROCALCITON, LATICACIDVEN in the last 168 hours.  Recent Results (from the past 240 hour(s))  MRSA PCR Screening     Status: None   Collection Time: 03/09/18 11:02 PM  Result Value Ref Range Status   MRSA by PCR NEGATIVE NEGATIVE Final    Comment:        The GeneXpert MRSA Assay (FDA approved for NASAL specimens only), is one component of a comprehensive MRSA colonization surveillance program. It is not intended to diagnose MRSA infection nor to guide or monitor treatment for MRSA infections. Performed at [facility], [address] [place], [place] [address]          Radiology Studies: No results found.      Scheduled Meds: . acidophilus  1 capsule Oral Daily  . calcitRIOL  0.25 mcg Oral Q M,W,F  . famotidine  20 mg Oral Daily  . feeding supplement  1 Container Oral TID BM  . folic acid  1 mg Oral Daily  .  hydrALAZINE  50 mg Oral TID  . lanthanum  500 mg Oral TID WC  . levothyroxine  75 mcg Oral QAC breakfast  . methylPREDNISolone  4 mg Oral Daily  . metoprolol tartrate  100 mg Oral BID  . multivitamin  1 tablet Oral QHS  . sodium chloride flush  3 mL Intravenous Q12H  . warfarin  2.5 mg Oral ONCE-1800  . Warfarin - Pharmacist Dosing Inpatient   Does not apply q1800   Continuous Infusions: . sodium chloride       LOS: 6 days        Glade Lloyd, MD [facility] Pager [phone]  If 7PM-7AM, please contact night-coverage [email or web address] Password [facility] 03/15/2018, 11:27 AM

## 2018-03-16 DIAGNOSIS — N186 End stage renal disease: Secondary | ICD-10-CM

## 2018-03-16 DIAGNOSIS — E43 Unspecified severe protein-calorie malnutrition: Secondary | ICD-10-CM

## 2018-03-16 LAB — PROTIME-INR
INR: 3.01
PROTHROMBIN TIME: 31 s — AB (ref 11.4–15.2)

## 2018-03-16 MED ORDER — WARFARIN SODIUM 1 MG PO TABS
1.0000 mg | ORAL_TABLET | Freq: Once | ORAL | Status: AC
  Administered 2018-03-16: 1 mg via ORAL
  Filled 2018-03-16: qty 1

## 2018-03-16 MED ORDER — PRO-STAT SUGAR FREE PO LIQD
30.0000 mL | Freq: Two times a day (BID) | ORAL | Status: DC
  Administered 2018-03-16 – 2018-03-23 (×15): 30 mL via ORAL
  Filled 2018-03-16 (×17): qty 30

## 2018-03-16 MED ORDER — DARBEPOETIN ALFA 100 MCG/0.5ML IJ SOSY
100.0000 ug | PREFILLED_SYRINGE | INTRAMUSCULAR | Status: DC
  Filled 2018-03-16: qty 0.5

## 2018-03-16 NOTE — NC FL2 (Addendum)
[place] MEDICAID FL2 LEVEL OF CARE SCREENING TOOL     IDENTIFICATION  Patient Name: Rachel Johns Birthdate: [birth year] Sex: female Admission Date (Current Location): 03/09/2018  [facility] and [place] Number:  Producer, television/film/video and Address:  [facility] [place]. [facility], [address] [place], [place] [address]      Provider Number: [id number]  Attending Physician Name and Address:  Standley Brooking, MD  Relative Name and Phone Number:  Daney Moor (son) [phone]    Current Level of Care: Hospital Recommended Level of Care: Skilled Nursing Facility Prior Approval Number:    Date Approved/Denied:   PASRR Number:  [id number]  Discharge Plan: SNF    Current Diagnoses: Patient Active Problem List   Diagnosis Date Noted  . Protein-calorie malnutrition, severe 03/14/2018  . Pressure injury of skin 03/10/2018  . AF (paroxysmal atrial fibrillation) (HCC) 03/10/2018  . Acute combined systolic and diastolic CHF, NYHA class 3 (HCC) 03/10/2018  . PFO (patent foramen ovale) 03/10/2018  . Fluid overload 03/09/2018  . Malnutrition of moderate degree 12/05/2016  . Dyspnea   . Rheumatoid arthritis (HCC) 12/04/2016  . Pericardial effusion 12/04/2016  . Uremia 12/04/2016  . Normocytic anemia 12/04/2016  . Leukocytosis 12/04/2016  . Metabolic acidosis 12/04/2016  . Acute URI 12/04/2016  . Essential hypertension 12/04/2016  . Acute kidney injury superimposed on chronic kidney disease (HCC) 12/04/2016  . Hyperglycemia 12/04/2016  . Hyponatremia 12/04/2016  . Emphysema of lung (HCC) 12/04/2016  . Left renal mass 12/04/2016  . Chronic kidney disease (CKD), stage IV (severe) (HCC) 11/08/2012  . Other complications due to renal dialysis device, implant, and graft 11/08/2012    Orientation RESPIRATION BLADDER Height & Weight     Self, Place  Normal Continent Weight: 130 lb 1.1 oz (59 kg) Height:  5' (152.4 cm)  BEHAVIORAL SYMPTOMS/MOOD NEUROLOGICAL BOWEL  NUTRITION STATUS      Continent Diet(Heart healthy. Fluid consistency (thin). Fluid restriction: 1500 mL)  AMBULATORY STATUS COMMUNICATION OF NEEDS Skin   Limited Assist Verbally Other (Comment)(Ecchymposis (arm;shoulder near left fistula), Sacrum wound (right and left sacrum treated with foam dressing), Stage 2 pressure injury to the coccyn treated with foam dressing. )                       Personal Care Assistance Level of Assistance  Bathing, Feeding, Dressing Bathing Assistance: Limited assistance Feeding assistance: Limited assistance Dressing Assistance: Limited assistance     Functional Limitations Info  Sight, Hearing, Speech Sight Info: Adequate Hearing Info: Impaired Speech Info: Adequate    SPECIAL CARE FACTORS FREQUENCY  PT (By licensed PT), OT (By licensed OT)     PT Frequency: Evaluated 4/16 and a minimum of 3x per week recommended during acute inpatient stay.  OT Frequency: Evaluated 4/15 and a minimum of 2x per week recommended during acute inpatient stay.             Contractures Contractures Info: Not present    Additional Factors Info  Code Status, Allergies Code Status Info: Full code Allergies Info: Tape, Levaquin(Levofloxacin in D5W)           Current Medications (03/16/2018):  This is the current hospital active medication list Current Facility-Administered Medications  Medication Dose Route Frequency Provider Last Rate Last Dose  . 0.9 %  sodium chloride infusion  250 mL Intravenous PRN Madelyn Flavors A, MD      . acetaminophen (TYLENOL) tablet 650 mg  650 mg Oral  Q4H PRN Clydie Braun, MD   650 mg at 03/16/18 1027  . acidophilus (RISAQUAD) capsule 1 capsule  1 capsule Oral Daily Katrinka Blazing, Rondell A, MD   1 capsule at 03/16/18 1028  . calcitRIOL (ROCALTROL) capsule 0.25 mcg  0.25 mcg Oral Q M,W,F Smith, Rondell A, MD   0.25 mcg at 03/16/18 1326  . [START ON 03/22/2018] Darbepoetin Alfa (ARANESP) injection 100 mcg  100 mcg Intravenous Q  Lesleigh Noe, MD      . famotidine (PEPCID) tablet 20 mg  20 mg Oral Daily Katrinka Blazing, Rondell A, MD   20 mg at 03/16/18 1028  . feeding supplement (BOOST / RESOURCE BREEZE) liquid 1 Container  1 Container Oral TID BM Glade Lloyd, MD   1 Container at 03/16/18 1327  . folic acid (FOLVITE) tablet 1 mg  1 mg Oral Daily Smith, Rondell A, MD   1 mg at 03/16/18 1028  . hydrALAZINE (APRESOLINE) injection 10 mg  10 mg Intravenous Q6H PRN Bodenheimer, Charles A, NP   10 mg at 03/11/18 0703  . hydrALAZINE (APRESOLINE) tablet 50 mg  50 mg Oral TID Madelyn Flavors A, MD   50 mg at 03/16/18 1028  . lanthanum (FOSRENOL) chewable tablet 500 mg  500 mg Oral TID WC Zetta Bills, MD   500 mg at 03/16/18 1326  . levothyroxine (SYNTHROID, LEVOTHROID) tablet 75 mcg  75 mcg Oral QAC breakfast Madelyn Flavors A, MD   75 mcg at 03/16/18 0840  . methylPREDNISolone (MEDROL) tablet 4 mg  4 mg Oral Daily Katrinka Blazing, Rondell A, MD   4 mg at 03/16/18 0840  . metoprolol tartrate (LOPRESSOR) tablet 100 mg  100 mg Oral BID Croitoru, Mihai, MD   100 mg at 03/16/18 1028  . multivitamin (RENA-VIT) tablet 1 tablet  1 tablet Oral QHS Zetta Bills, MD   1 tablet at 03/15/18 2125  . ondansetron (ZOFRAN) injection 4 mg  4 mg Intravenous Q6H PRN Madelyn Flavors A, MD   4 mg at 03/14/18 1141  . sodium chloride flush (NS) 0.9 % injection 3 mL  3 mL Intravenous Q12H Smith, Rondell A, MD   3 mL at 03/16/18 1327  . sodium chloride flush (NS) 0.9 % injection 3 mL  3 mL Intravenous PRN Smith, Rondell A, MD      . warfarin (COUMADIN) tablet 1 mg  1 mg Oral ONCE-1800 Hammons, Gerhard Munch, RPH      . Warfarin - Pharmacist Dosing Inpatient   Does not apply q1800 Hammons, Gerhard Munch [facility]         Discharge Medications: Please see discharge summary for a list of discharge medications.  Relevant Imaging Results:  Relevant Lab Results:   Additional Information SS# [id number] New HD patient in the process of being set up at HD center.   Waldemar Dickens, Student-Social Work [phone]

## 2018-03-16 NOTE — Progress Notes (Signed)
Patient ID: Rachel Johns, female   DOB: January 20, 1934, 84 y.o.   MRN: [id number] [facility] Progress Note   Assessment/ Plan:    1.    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  HD #3 4/16, plan for next rx tomorrow with expert cannulator. If repeated infiltration, will need TDC to rest AVG (placed 3/18 by Dr Darrick Penna)  2.  Acute exacerbation of diastolic heart failure: Was refractory to diuretic therapy at [facility] and transferred to [facility] for initiation of hemodialysis-appears closer to euvolemia  3.  Hypertension: Blood pressure appears to be fairly well controlled on current therapy/HD.  4.   Nutrition:  Albumin 2.1, on protein supps  5.  Anemia of chronic kidney disease: Iron studies with Tsat 28%, ferritin 233, Hgb 9,2--> Aranesp 100 mg   6.  Secondary hyperparathyroidism: PTH level at goal for chronic kidney disease stage V/ESRD.  Begin phosphorus binder for hyperphosphatemia- fosrenol and is also on calcitriol  7.  Afib: On Coumadin and lopressor  8.  Dispo: Ok from renal perspective to d/c as soon as CLIP complete  Subjective:    Dialyzed yesterday with 2 17 g needles and an expert cannulator.  This AM has no complaints.  Awaiting final CLIP   Objective:   BP (!) 123/52 (BP Location: Right Arm)   Pulse 83   Temp 98.4 F (36.9 C) (Oral)   Resp 16   Ht 5' (1.524 m)   Wt 59 kg (130 lb 1.1 oz)   SpO2 100%   BMI 25.40 kg/m   Intake/Output Summary (Last 24 hours) at 03/16/2018 1212 Last data filed at 03/16/2018 0920 Gross per 24 hour  Intake 480 ml  Output 1800 ml  Net -1320 ml   Weight change: 4 kg (8 lb 13.1 oz)  Physical Exam: Gen: Comfortably resting in bed, awake/alert CVS: Irregularly irregular, normal rate, S1 and S2 normal Resp: Clear to auscultation, no rales/rhonchi. Abd: Soft, flat, nontender Ext: 1+ ankle edema bilaterally.  Left  upper arm AVG with thrill/ bruit, some ecchymosis from prior infiltration.  Imaging: No results found.  Labs: BMET Recent Labs  Lab 03/10/18 0850 03/11/18 1000 03/11/18 1414 03/12/18 0818 03/13/18 0751 03/14/18 0921 03/15/18 1645  NA 125* 125* 125* 129* 135 132* 130*  K 4.1 3.6 3.5 4.0 3.6 3.8 3.9  CL 86* 85* 84* 89* 93* 93* 93*  CO2 18* 17* 19* 22 27 24 22   GLUCOSE 79 137* 160* 88 98 79 101*  BUN 106* 119* 124* 86* 54* 68* 89*  CREATININE 5.09* 5.13* 5.08* 4.08* 3.17* 3.75* 4.23*  CALCIUM 8.5* 8.4* 8.3* 8.3* 8.4* 7.9* 7.9*  PHOS 8.8*  --  8.4*  --   --  5.4* 6.6*   CBC Recent Labs  Lab 03/11/18 1000  03/12/18 0818 03/13/18 0751 03/14/18 0921 03/15/18 1645  WBC 28.4*   < > 23.8* 19.8* 18.5* 22.0*  NEUTROABS 25.9*  --  21.4* 17.0* 13.1*  --   HGB 9.9*   < > 10.1* 10.4* 9.2* 9.3*  HCT 30.6*   < > 32.7* 33.9* 29.5* 30.6*  MCV 81.8   < > 84.3 86.5 86.8 86.0  PLT 291   < > 270 330 331 374   < > = values in this interval not displayed.    Medications:    . acidophilus  1 capsule Oral Daily  . calcitRIOL  0.25 mcg Oral  Q M,W,F  . [START ON 03/22/2018] darbepoetin (ARANESP) injection - DIALYSIS  60 mcg Intravenous Q Tue-HD  . famotidine  20 mg Oral Daily  . feeding supplement  1 Container Oral TID BM  . folic acid  1 mg Oral Daily  . hydrALAZINE  50 mg Oral TID  . lanthanum  500 mg Oral TID WC  . levothyroxine  75 mcg Oral QAC breakfast  . methylPREDNISolone  4 mg Oral Daily  . metoprolol tartrate  100 mg Oral BID  . multivitamin  1 tablet Oral QHS  . sodium chloride flush  3 mL Intravenous Q12H  . warfarin  1 mg Oral ONCE-1800  . Warfarin - Pharmacist Dosing Inpatient   Does not apply q1800   Bufford Buttner, MD [facility] pgr [phone] 03/16/2018, 12:12 PM

## 2018-03-16 NOTE — Progress Notes (Signed)
Nutrition Follow-up  DOCUMENTATION CODES:   Severe malnutrition in context of chronic illness  INTERVENTION:   -continue Rena-Vit  -continue Boost Breeze po TID, each supplement provides 250 kcal and 9 grams of protein  -Add Pro-Stat 30 mL BID  -agree with no further dietary restrictions at this time as po intake not adequate  NUTRITION DIAGNOSIS:   Severe Malnutrition related to chronic illness(newly diagnosed ESRD on HD, CHF) as evidenced by moderate fat depletion, severe fat depletion, moderate muscle depletion, severe muscle depletion.  Being addressed via supplement, less restrictive diet  GOAL:   Patient will meet greater than or equal to 90% of their needs  Progressing  MONITOR:   PO intake, Supplement acceptance, I & O's, Skin, Weight trends  REASON FOR ASSESSMENT:   Malnutrition Screening Tool    ASSESSMENT:   84 year old female with PMH significant for HTN, CKD stage IV, RA, hypothyroidism, and CHF who presented to [facility] on [facility]18 with worsening SOB, lower extremity edema, and a 7 lb weight gain. Pt transferred to [facility] for need of hemodialysis.  4/12 1st HD treatment  Pt eating but appetite is fair. Pt reports she eats something at all meal periods but not a lot. For instance, pt only ordered vegetable soup for lunch today but ate 100% of this. Recorded po intake 50-100% of meals. Reinforced the importance of adequate nutrition, adequate protein intake. Pt does like the Boost Breeze and drinking well.   Pt started on Fosrenol for hyperphosphatemia; reviewed purpose of the medication and how to take. Also reviewed importance of taking renal MVI and importance of taking after HD  Labs: phosphorus 6.6 (H), sodium 130 (L), PTH 266 (acceptable fo HD) Meds: calcitriol, acidophilus, folic acid, solumedrol, Rena-Vit, Fosrenol  Diet Order:  Fall precautions Diet Heart Room service appropriate? Yes; Fluid consistency: Thin; Fluid restriction: 1500  mL Fluid  EDUCATION NEEDS:   No education needs have been identified at this time  Skin:  Skin Assessment: Skin Integrity Issues: Skin Integrity Issues:: Stage II, Incisions Stage II: coccyx Incisions: closed incision to left arm  Last BM:  4/17  Height:   Ht Readings from Last 1 Encounters:  03/10/18 5' (1.524 m)    Weight:   Wt Readings from Last 1 Encounters:  03/16/18 130 lb 1.1 oz (59 kg)    Ideal Body Weight:  56.8 kg  BMI:  Body mass index is 25.4 kg/m.  Estimated Nutritional Needs:   Kcal:  1600-1800 kcal/day  Protein:  75-90 grams/day  Fluid:  1000 mL plus UOP   [employer] MS, RD, LDN, CNSC [phone] Pager  [phone] Weekend/On-Call Pager

## 2018-03-16 NOTE — NC FL2 (Deleted)
[place] MEDICAID FL2 LEVEL OF CARE SCREENING TOOL     IDENTIFICATION  Patient Name: Rachel Johns Birthdate: 1934-11-01 Sex: female Admission Date (Current Location): 03/09/2018  [facility] and [place] Number:  Producer, television/film/video and Address:  [facility] [place]. [facility], [address] [place], [place] [address]      Provider Number: [id number]  Attending Physician Name and Address:  Standley Brooking, MD  Relative Name and Phone Number:  Bessy Reaney (son) [phone]    Current Level of Care: Hospital Recommended Level of Care: Skilled Nursing Facility Prior Approval Number:    Date Approved/Denied:   PASRR Number:    Discharge Plan: SNF    Current Diagnoses: Patient Active Problem List   Diagnosis Date Noted  . Protein-calorie malnutrition, severe 03/14/2018  . Pressure injury of skin 03/10/2018  . AF (paroxysmal atrial fibrillation) (HCC) 03/10/2018  . Acute combined systolic and diastolic CHF, NYHA class 3 (HCC) 03/10/2018  . PFO (patent foramen ovale) 03/10/2018  . Fluid overload 03/09/2018  . Malnutrition of moderate degree 12/05/2016  . Dyspnea   . Rheumatoid arthritis (HCC) 12/04/2016  . Pericardial effusion 12/04/2016  . Uremia 12/04/2016  . Normocytic anemia 12/04/2016  . Leukocytosis 12/04/2016  . Metabolic acidosis 12/04/2016  . Acute URI 12/04/2016  . Essential hypertension 12/04/2016  . Acute kidney injury superimposed on chronic kidney disease (HCC) 12/04/2016  . Hyperglycemia 12/04/2016  . Hyponatremia 12/04/2016  . Emphysema of lung (HCC) 12/04/2016  . Left renal mass 12/04/2016  . Chronic kidney disease (CKD), stage IV (severe) (HCC) 11/08/2012  . Other complications due to renal dialysis device, implant, and graft 11/08/2012    Orientation RESPIRATION BLADDER Height & Weight     Self, Place  Normal Continent Weight: 130 lb 1.1 oz (59 kg) Height:  5' (152.4 cm)  BEHAVIORAL SYMPTOMS/MOOD NEUROLOGICAL BOWEL NUTRITION STATUS       Continent Diet(Heart Healthy. Fluid consistency - Thin. Fluid restriction: 1500 mL )  AMBULATORY STATUS COMMUNICATION OF NEEDS Skin   Limited Assist Verbally Normal                       Personal Care Assistance Level of Assistance  Bathing, Feeding, Dressing Bathing Assistance: Limited assistance Feeding assistance: Limited assistance Dressing Assistance: Limited assistance     Functional Limitations Info  Sight, Hearing, Speech Sight Info: Adequate Hearing Info: Adequate Speech Info: Adequate    SPECIAL CARE FACTORS FREQUENCY  PT (By licensed PT), OT (By licensed OT)     PT Frequency: Evaluated 4/16 and a minimum of 3x per week recommended during acute inpatient stay.  OT Frequency: Evaluated 4/15 and a minimum of 2x per week recommended during acute inpatient stay.             Contractures Contractures Info: Not present    Additional Factors Info  Code Status, Allergies Code Status Info: Full Code Allergies Info: Tape, Levaquin(Levoflozacin in D5w)           Current Medications (03/16/2018):  This is the current hospital active medication list Current Facility-Administered Medications  Medication Dose Route Frequency Provider Last Rate Last Dose  . 0.9 %  sodium chloride infusion  250 mL Intravenous PRN Madelyn Flavors A, MD      . acetaminophen (TYLENOL) tablet 650 mg  650 mg Oral Q4H PRN Madelyn Flavors A, MD   650 mg at 03/16/18 1027  . acidophilus (RISAQUAD) capsule 1 capsule  1 capsule Oral Daily  Clydie Braun, MD   1 capsule at 03/16/18 1028  . calcitRIOL (ROCALTROL) capsule 0.25 mcg  0.25 mcg Oral Q M,W,F Katrinka Blazing, Rondell A, MD   0.25 mcg at 03/15/18 1828  . [START ON 03/22/2018] Darbepoetin Alfa (ARANESP) injection 100 mcg  100 mcg Intravenous Q Lesleigh Noe, MD      . famotidine (PEPCID) tablet 20 mg  20 mg Oral Daily Katrinka Blazing, Rondell A, MD   20 mg at 03/16/18 1028  . feeding supplement (BOOST / RESOURCE BREEZE) liquid 1 Container  1  Container Oral TID BM Glade Lloyd, MD   1 Container at 03/16/18 1028  . folic acid (FOLVITE) tablet 1 mg  1 mg Oral Daily Smith, Rondell A, MD   1 mg at 03/16/18 1028  . hydrALAZINE (APRESOLINE) injection 10 mg  10 mg Intravenous Q6H PRN Bodenheimer, Charles A, NP   10 mg at 03/11/18 0703  . hydrALAZINE (APRESOLINE) tablet 50 mg  50 mg Oral TID Madelyn Flavors A, MD   50 mg at 03/16/18 1028  . lanthanum (FOSRENOL) chewable tablet 500 mg  500 mg Oral TID WC Zetta Bills, MD   500 mg at 03/16/18 0840  . levothyroxine (SYNTHROID, LEVOTHROID) tablet 75 mcg  75 mcg Oral QAC breakfast Madelyn Flavors A, MD   75 mcg at 03/16/18 0840  . methylPREDNISolone (MEDROL) tablet 4 mg  4 mg Oral Daily Katrinka Blazing, Rondell A, MD   4 mg at 03/16/18 0840  . metoprolol tartrate (LOPRESSOR) tablet 100 mg  100 mg Oral BID Croitoru, Mihai, MD   100 mg at 03/16/18 1028  . multivitamin (RENA-VIT) tablet 1 tablet  1 tablet Oral QHS Zetta Bills, MD   1 tablet at 03/15/18 2125  . ondansetron (ZOFRAN) injection 4 mg  4 mg Intravenous Q6H PRN Madelyn Flavors A, MD   4 mg at 03/14/18 1141  . sodium chloride flush (NS) 0.9 % injection 3 mL  3 mL Intravenous Q12H Smith, Rondell A, MD   3 mL at 03/15/18 1003  . sodium chloride flush (NS) 0.9 % injection 3 mL  3 mL Intravenous PRN Smith, Rondell A, MD      . warfarin (COUMADIN) tablet 1 mg  1 mg Oral ONCE-1800 Hammons, Gerhard Munch, RPH      . Warfarin - Pharmacist Dosing Inpatient   Does not apply q1800 Hammons, Gerhard Munch [facility]         Discharge Medications: Please see discharge summary for a list of discharge medications.  Relevant Imaging Results:  Relevant Lab Results:   Additional Information ss# [id number]  Maxwell [place] Work [phone]

## 2018-03-16 NOTE — Progress Notes (Signed)
  PROGRESS NOTE  Rachel Johns IZT:[id number] DOB: 1933-12-15 DOA: 03/09/2018 PCP: Casper Harrison, Stephanie Coup, MD  Brief Narrative: 84 year old woman PMH CKD stage IV transferred from [facility] for worsening renal failure, other issues included atrial fibrillation with rapid ventricular response.  She was seen by nephrology and hemodialysis was initiated.  Also seen by cardiology with adjustments made to medication.  Assessment/Plan Chronic kidney disease stage IV-5 with progression to end-stage renal disease. --Continue hemodialysis per nephrology.  Acute on chronic systolic and diastolic congestive of heart failure.  LVEF 40-45%.  Severe tricuspid regurgitation by report. --Appears stable with hemodialysis.  Atrial fibrillation, new diagnosis.  Episodes of RVR during this admission. --Currently in sinus rhythm.  Continue beta-blocker, warfarin. --PFO? Follow-up as an outpatient.  Persistent leukocytosis.  No evidence of infection.  No evidence of blood dyscrasia. --Follow clinically.  Hypothyroidism. Continue levothyroxine.  Rheumatoid arthritis.  Steroid dependent.  Anemia of chronic kidney disease. --Hemoglobin stable.  Severe malnutrition --Per dietitian.   Anticipate transfer to skilled nursing facility when clear from a nephrology standpoint.  DVT prophylaxis: warfarin Code Status: full code Family Communication: none Disposition Plan: SNF    Brendia Sacks, MD  [facility] Direct contact: [phone] --Via amion app OR  --[email or web address]; password [email or web address]  7PM-7AM contact night coverage as above 03/16/2018, 5:38 PM  LOS: 7 days   Consultants:  Nephrology  Cardiology  Procedures:    Antimicrobials:    Interval history/Subjective: Feels okay.  No particular complaints voiced.  Objective: Vitals:  Vitals:   03/16/18 0501 03/16/18 0921  BP: 128/77 (!) 123/52  Pulse: (!) 114 83  Resp: 18 16  Temp: 98 F (36.7 C) 98.4 F (36.9 C)   SpO2: 97% 100%    Exam:  Constitutional:  . Appears calm and comfortable Respiratory:  . CTA bilaterally, no w/r/r.  . Respiratory effort normal Cardiovascular:  . RRR, no m/r/g . No LE extremity edema   Psychiatric:  . Mental status o Mood, affect appropriate  I have personally reviewed the following:   Labs:  INR 3.01  Scheduled Meds: . acidophilus  1 capsule Oral Daily  . calcitRIOL  0.25 mcg Oral Q M,W,F  . [START ON 03/22/2018] darbepoetin (ARANESP) injection - DIALYSIS  100 mcg Intravenous Q Tue-HD  . famotidine  20 mg Oral Daily  . feeding supplement  1 Container Oral TID BM  . feeding supplement (PRO-STAT SUGAR FREE 64)  30 mL Oral BID  . folic acid  1 mg Oral Daily  . hydrALAZINE  50 mg Oral TID  . lanthanum  500 mg Oral TID WC  . levothyroxine  75 mcg Oral QAC breakfast  . methylPREDNISolone  4 mg Oral Daily  . metoprolol tartrate  100 mg Oral BID  . multivitamin  1 tablet Oral QHS  . sodium chloride flush  3 mL Intravenous Q12H  . warfarin  1 mg Oral ONCE-1800  . Warfarin - Pharmacist Dosing Inpatient   Does not apply q1800   Continuous Infusions: . sodium chloride      Principal Problem:   ESRD (end stage renal disease) (HCC) Active Problems:   Rheumatoid arthritis (HCC)   Normocytic anemia   Leukocytosis   Hyponatremia   AF (paroxysmal atrial fibrillation) (HCC)   Acute combined systolic and diastolic CHF, NYHA class 3 (HCC)   Protein-calorie malnutrition, severe   LOS: 7 days

## 2018-03-16 NOTE — Progress Notes (Signed)
ANTICOAGULATION CONSULT NOTE - Follow Up Consult  Pharmacy Consult for warfarin Indication: atrial fibrillation  Allergies  Allergen Reactions  . Tape Other (See Comments)    Tears skin - please use paper tape  . Levaquin [Levofloxacin In D5w] Other (See Comments)    Per patient this caused insomnia     Patient Measurements: Height: 5' (152.4 cm) Weight: 130 lb 1.1 oz (59 kg) IBW/kg (Calculated) : 45.5   Vital Signs: Temp: 98.4 F (36.9 C) (04/17 0921) Temp Source: Oral (04/17 0921) BP: 123/52 (04/17 0921) Pulse Rate: 83 (04/17 0921)  Labs: Recent Labs    03/14/18 0921 03/14/18 1027 03/15/18 0627 03/15/18 1645 03/16/18 0629  HGB 9.2*  --   --  9.3*  --   HCT 29.5*  --   --  30.6*  --   PLT 331  --   --  374  --   LABPROT  --  25.3* 30.0*  --  31.0*  INR  --  2.32 2.89  --  3.01  CREATININE 3.75*  --   --  4.23*  --     Estimated Creatinine Clearance: 8.2 mL/min (A) (by C-G formula based on SCr of 4.23 mg/dL (H)).   Medications:  Scheduled:  . acidophilus  1 capsule Oral Daily  . calcitRIOL  0.25 mcg Oral Q M,W,F  . [START ON 03/22/2018] darbepoetin (ARANESP) injection - DIALYSIS  60 mcg Intravenous Q Tue-HD  . famotidine  20 mg Oral Daily  . feeding supplement  1 Container Oral TID BM  . folic acid  1 mg Oral Daily  . hydrALAZINE  50 mg Oral TID  . lanthanum  500 mg Oral TID WC  . levothyroxine  75 mcg Oral QAC breakfast  . methylPREDNISolone  4 mg Oral Daily  . metoprolol tartrate  100 mg Oral BID  . multivitamin  1 tablet Oral QHS  . sodium chloride flush  3 mL Intravenous Q12H  . Warfarin - Pharmacist Dosing Inpatient   Does not apply q1800   Infusions:  . sodium chloride      Assessment: 84 YOF transferred from [facility] to initiate dialysis. At [facility] she was started on warfarin/heparin bridge for new Afib. INR remains slightly above goal today at 3.01. CBC is stable, no bleeding noted.  Consider regimen similar to 2.5 mg most days, 3.75 mg  1-2x per week  Goal of Therapy:  INR 2-3 Monitor platelets by anticoagulation protocol: Yes   Plan:  Warfarin 1 mg PO x 1 Daily INR, CBC Monitor s/sx of bleeding   [employer], Pharm.D., BCPS Clinical Pharmacist Pager: [phone] Clinical phone for 03/16/2018 from 8:30-4:00 is [phone]. After 4pm, please call Main Rx [phone]) for assistance. 03/16/2018 10:11 AM

## 2018-03-17 DIAGNOSIS — I5042 Chronic combined systolic (congestive) and diastolic (congestive) heart failure: Secondary | ICD-10-CM

## 2018-03-17 DIAGNOSIS — L8990 Pressure ulcer of unspecified site, unspecified stage: Secondary | ICD-10-CM

## 2018-03-17 LAB — PROTIME-INR
INR: 3.23
PROTHROMBIN TIME: 32.7 s — AB (ref 11.4–15.2)

## 2018-03-17 NOTE — Progress Notes (Signed)
  PROGRESS NOTE  Rachel Johns YYQ:[id number] DOB: [birth year] DOA: 03/09/2018 PCP: Casper Harrison, Stephanie Coup, MD  Brief Narrative: 84 year old woman PMH CKD stage IV transferred from [facility] for worsening renal failure, other issues included atrial fibrillation with rapid ventricular response.  She was seen by nephrology and hemodialysis was initiated.  Also seen by cardiology with adjustments made to medication.  Assessment/Plan Chronic kidney disease stage IV-5 with progression to end-stage renal disease. --Continue hemodialysis per nephrology  Acute on chronic systolic and diastolic congestive of heart failure.  LVEF 40-45%.  Severe tricuspid regurgitation by report. --Appears compensated now with hemodialysis.  Atrial fibrillation, new diagnosis.  Episodes of RVR during this admission. --Remains in sinus rhythm.  Continue beta-blocker, warfarin. --PFO? Follow-up as an outpatient.  Persistent leukocytosis.  No evidence of infection.  No evidence of blood dyscrasia. --Follow clinically.  Hypothyroidism. Continue levothyroxine.  Rheumatoid arthritis.  Steroid dependent.  Anemia of chronic kidney disease. --Hemoglobin stable.  Severe malnutrition --Per dietitian.   Appears stable for transfer to skilled nursing facility once CLIP complete.  DVT prophylaxis: warfarin Code Status: full code Family Communication: none Disposition Plan: SNF    Brendia Sacks, MD  [facility] Direct contact: [phone] --Via amion app OR  --[email or web address]; password [email or web address]  7PM-7AM contact night coverage as above 03/17/2018, 11:55 AM  LOS: 8 days   Consultants:  Nephrology  Cardiology  Procedures:    Antimicrobials:    Interval history/Subjective: Feels okay.  No nausea or vomiting.  Objective: Vitals:  Vitals:   03/17/18 0442 03/17/18 0941  BP: 135/65 (!) 161/71  Pulse: 68 72  Resp: 18 16  Temp: 97.9 F (36.6 C) 97.6 F (36.4 C)  SpO2: 98% 99%     Exam:  Constitutional:   . Appears calm and comfortable Respiratory:  . CTA bilaterally, no w/r/r.  . Respiratory effort normal Cardiovascular:  . RRR, no m/r/g . No LE extremity edema   Psychiatric:  . Mental status o Mood, affect appropriate  I have personally reviewed the following:   Labs:  INR 3.23  Scheduled Meds: . acidophilus  1 capsule Oral Daily  . calcitRIOL  0.25 mcg Oral Q M,W,F  . [START ON 03/22/2018] darbepoetin (ARANESP) injection - DIALYSIS  100 mcg Intravenous Q Tue-HD  . famotidine  20 mg Oral Daily  . feeding supplement  1 Container Oral TID BM  . feeding supplement (PRO-STAT SUGAR FREE 64)  30 mL Oral BID  . folic acid  1 mg Oral Daily  . hydrALAZINE  50 mg Oral TID  . lanthanum  500 mg Oral TID WC  . levothyroxine  75 mcg Oral QAC breakfast  . methylPREDNISolone  4 mg Oral Daily  . metoprolol tartrate  100 mg Oral BID  . multivitamin  1 tablet Oral QHS  . sodium chloride flush  3 mL Intravenous Q12H  . Warfarin - Pharmacist Dosing Inpatient   Does not apply q1800   Continuous Infusions: . sodium chloride      Principal Problem:   ESRD (end stage renal disease) (HCC) Active Problems:   Rheumatoid arthritis (HCC)   Normocytic anemia   Leukocytosis   Hyponatremia   AF (paroxysmal atrial fibrillation) (HCC)   Acute combined systolic and diastolic CHF, NYHA class 3 (HCC)   Protein-calorie malnutrition, severe   Pressure ulcer   LOS: 8 days

## 2018-03-17 NOTE — Progress Notes (Signed)
Occupational Therapy Treatment Patient Details Name: Rachel Johns MRN: [id number] DOB: Jul 29, 1934 Today's Date: 03/17/2018    History of present illness Pt is an 84 y.o. female admitted 03/09/18 secondary to SOB; found to be in a-fib with RVR. Worked up for AKI on superimposed on CKD; HD initiated 4/12 via LUE AVG. Also with decompensated systolic HF. PMH includes CHF, HTN, CKD.   OT comments  Pt making progress with functional goals, OT will continue to follow acutely  Follow Up Recommendations  SNF;Supervision/Assistance - 24 hour    Equipment Recommendations  Other (comment)(TBD at next venue of care)    Recommendations for Other Services      Precautions / Restrictions Precautions Precautions: Fall Restrictions Weight Bearing Restrictions: No       Mobility Bed Mobility Overal bed mobility: Needs Assistance Bed Mobility: Sit to Supine;Supine to Sit     Supine to sit: Supervision Sit to supine: Supervision   General bed mobility comments: incr time to bring LEs onto bed  Transfers Overall transfer level: Needs assistance Equipment used: Rolling walker (2 wheeled) Transfers: Sit to/from Stand Sit to Stand: Min guard              Balance Overall balance assessment: Needs assistance Sitting-balance support: No upper extremity supported;Feet supported Sitting balance-Leahy Scale: Fair Sitting balance - Comments: pt fluctuating between close min guard with and without UE supports. Trunk and neck flexed position.    Standing balance support: During functional activity;Bilateral upper extremity supported Standing balance-Leahy Scale: Poor Standing balance comment: pt stood at sink to grooming tasks                           ADL either performed or assessed with clinical judgement   ADL Overall ADL's : Needs assistance/impaired     Grooming: Min guard;Standing   Upper Body Bathing: Sitting;Min guard Upper Body Bathing Details (indicate cue  type and reason): simulated Lower Body Bathing: Minimal assistance;Sit to/from stand Lower Body Bathing Details (indicate cue type and reason): simulated         Toilet Transfer: Min guard;RW;Ambulation;Comfort height toilet;Cueing for safety   Toileting- Clothing Manipulation and Hygiene: Sit to/from stand;Min guard       Functional mobility during ADLs: Min guard;Rolling walker;Cueing for safety General ADL Comments: pt stood at sink for grooming/hygiene tasks, required verbal cues for safety for correct positioning of RW at sink     Vision Baseline Vision/History: Wears glasses Patient Visual Report: No change from baseline     Perception     Praxis      Cognition Arousal/Alertness: Awake/alert Behavior During Therapy: WFL for tasks assessed/performed                                            Exercises     Shoulder Instructions       General Comments      Pertinent Vitals/ Pain       Pain Assessment: No/denies pain Pain Score: 0-No pain Pain Intervention(s): Monitored during session  Home Living                                          Prior Functioning/Environment  Frequency  Min 2X/week        Progress Toward Goals  OT Goals(current goals can now be found in the care plan section)  Progress towards OT goals: Progressing toward goals  Acute Rehab OT Goals Patient Stated Goal: Get stronger  Plan Discharge plan remains appropriate    Co-evaluation                 AM-PAC PT "6 Clicks" Daily Activity     Outcome Measure   Help from another person eating meals?: None Help from another person taking care of personal grooming?: A Little Help from another person toileting, which includes using toliet, bedpan, or urinal?: A Little Help from another person bathing (including washing, rinsing, drying)?: A Little Help from another person to put on and taking off regular upper body  clothing?: A Little Help from another person to put on and taking off regular lower body clothing?: A Little 6 Click Score: 19    End of Session Equipment Utilized During Treatment: Gait belt;Rolling walker;Other (comment)(3 in 1)  OT Visit Diagnosis: Unsteadiness on feet (R26.81);Muscle weakness (generalized) (M62.81);Pain   Activity Tolerance Patient limited by fatigue   Patient Left in bed;with call bell/phone within reach;with bed alarm set   Nurse Communication      Functional Assessment Tool Used: AM-PAC 6 Clicks Daily Activity   Time: 1937-9024 OT Time Calculation (min): 24 min  Charges: OT G-codes **NOT FOR INPATIENT CLASS** Functional Assessment Tool Used: AM-PAC 6 Clicks Daily Activity OT General Charges $OT Visit: 1 Visit OT Treatments $Self Care/Home Management : 8-22 mins $Therapeutic Activity: 8-22 mins     Galen Manila 03/17/2018, 2:35 PM

## 2018-03-17 NOTE — Progress Notes (Signed)
Patient ID: Rachel Johns, female   DOB: 06-24-1934, 84 y.o.   MRN: [id number] [facility] Progress Note   Assessment/ Plan:    1.    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  HD today 4/18 with expert cannulator. If repeated infiltration, will need TDC to rest AVG (placed 3/18 by Dr Darrick Penna)  2.  Acute exacerbation of diastolic heart failure: resolving with initiation of HD  3.  Hypertension: Blood pressure appears to be fairly well controlled on current therapy/HD.  4.   Nutrition:  Albumin 2.1, on protein supps  5.  Anemia of chronic kidney disease: Iron studies with Tsat 28%, ferritin 233, Hgb 9,2--> Aranesp 100 mg   6.  Secondary hyperparathyroidism: PTH level at goal for chronic kidney disease stage V/ESRD.  Begin phosphorus binder for hyperphosphatemia- fosrenol and is also on calcitriol  7.  Afib: On Coumadin and lopressor  8.  Dispo: Ok from renal perspective to d/c as soon as CLIP complete  Subjective:    No acute events.  Needs to use the restroom- assisted to bathroom and notified nursing staff.   Objective:   BP (!) 161/71 (BP Location: Right Leg)   Pulse 72   Temp 97.6 F (36.4 C) (Oral)   Resp 16   Ht 5' (1.524 m)   Wt 59.8 kg (131 lb 13.4 oz)   SpO2 99%   BMI 25.75 kg/m   Intake/Output Summary (Last 24 hours) at 03/17/2018 1156 Last data filed at 03/17/2018 1002 Gross per 24 hour  Intake 1680 ml  Output 0 ml  Net 1680 ml   Weight change: -0.6 kg (-1 lb 5.2 oz)  Physical Exam: Gen: lying flat and then walking with rolling walker CVS: Irregularly irregular, normal rate, S1 and S2 normal Resp: Clear to auscultation, no rales/rhonchi. Abd: Soft, flat, nontender Ext: 1+ ankle edema bilaterally.  Left upper arm AVG with thrill/ bruit, some ecchymosis from prior infiltration which is much improved  Imaging: No results  found.  Labs: BMET Recent Labs  Lab 03/11/18 1000 03/11/18 1414 03/12/18 0818 03/13/18 0751 03/14/18 0921 03/15/18 1645  NA 125* 125* 129* 135 132* 130*  K 3.6 3.5 4.0 3.6 3.8 3.9  CL 85* 84* 89* 93* 93* 93*  CO2 17* 19* 22 27 24 22   GLUCOSE 137* 160* 88 98 79 101*  BUN 119* 124* 86* 54* 68* 89*  CREATININE 5.13* 5.08* 4.08* 3.17* 3.75* 4.23*  CALCIUM 8.4* 8.3* 8.3* 8.4* 7.9* 7.9*  PHOS  --  8.4*  --   --  5.4* 6.6*   CBC Recent Labs  Lab 03/11/18 1000  03/12/18 0818 03/13/18 0751 03/14/18 0921 03/15/18 1645  WBC 28.4*   < > 23.8* 19.8* 18.5* 22.0*  NEUTROABS 25.9*  --  21.4* 17.0* 13.1*  --   HGB 9.9*   < > 10.1* 10.4* 9.2* 9.3*  HCT 30.6*   < > 32.7* 33.9* 29.5* 30.6*  MCV 81.8   < > 84.3 86.5 86.8 86.0  PLT 291   < > 270 330 331 374   < > = values in this interval not displayed.    Medications:    . acidophilus  1 capsule Oral Daily  . calcitRIOL  0.25 mcg Oral Q M,W,F  . [START ON 03/22/2018] darbepoetin (ARANESP) injection - DIALYSIS  100 mcg Intravenous Q Tue-HD  . famotidine  20 mg Oral Daily  .  feeding supplement  1 Container Oral TID BM  . feeding supplement (PRO-STAT SUGAR FREE 64)  30 mL Oral BID  . folic acid  1 mg Oral Daily  . hydrALAZINE  50 mg Oral TID  . lanthanum  500 mg Oral TID WC  . levothyroxine  75 mcg Oral QAC breakfast  . methylPREDNISolone  4 mg Oral Daily  . metoprolol tartrate  100 mg Oral BID  . multivitamin  1 tablet Oral QHS  . sodium chloride flush  3 mL Intravenous Q12H  . Warfarin - Pharmacist Dosing Inpatient   Does not apply q1800   Bufford Buttner, MD [facility] pgr [phone] 03/17/2018, 11:56 AM

## 2018-03-17 NOTE — Progress Notes (Signed)
Physical Therapy Treatment Patient Details Name: Rachel Johns MRN: [id number] DOB: 1933-12-24 Today's Date: 03/17/2018    History of Present Illness Pt is an 84 y.o. female admitted 03/09/18 secondary to SOB; found to be in a-fib with RVR. Worked up for AKI on superimposed on CKD; HD initiated 4/12 via LUE AVG. Also with decompensated systolic HF. PMH includes CHF, HTN, CKD.    PT Comments    Pt progressing  Slowly, agreeable to PT but with complaints of severe fatigue; continue to recommend SNF to maximize pt safety and independence; continue PT POC   Follow Up Recommendations  SNF;Supervision/Assistance - 24 hour     Equipment Recommendations  None recommended by PT    Recommendations for Other Services       Precautions / Restrictions Precautions Precautions: Fall Restrictions Weight Bearing Restrictions: No    Mobility  Bed Mobility Overal bed mobility: Needs Assistance Bed Mobility: Sit to Supine       Sit to supine: Supervision   General bed mobility comments: incr time to bring LEs onto bed  Transfers Overall transfer level: Needs assistance Equipment used: Rolling walker (2 wheeled) Transfers: Sit to/from Stand Sit to Stand: Min guard         General transfer comment: cues for hand placement, difficulty pushing with RUE d/t chronic shoulder pain;  Ambulation/Gait Ambulation/Gait assistance: Min guard Ambulation Distance (Feet): 75 Feet Assistive device: Rolling walker (2 wheeled) Gait Pattern/deviations: Step-through pattern;Decreased stride length;Trunk flexed Gait velocity: Decreased   General Gait Details: cues for posture, pt with steady gait with RW although fatigues rapidly, requiring min/guard for safety; HR 70s/VSS   Stairs             Wheelchair Mobility    Modified Rankin (Stroke Patients Only)       Balance   Sitting-balance support: No upper extremity supported;Feet supported Sitting balance-Leahy Scale: Fair      Standing balance support: During functional activity;Bilateral upper extremity supported Standing balance-Leahy Scale: Poor Standing balance comment: Reliant on UE support                            Cognition Arousal/Alertness: Awake/alert Behavior During Therapy: WFL for tasks assessed/performed                                   General Comments: pt appears WFL for puposes of PT session; complaints of breakfast being cold however easily redirected  to mobility today; pt is agreeable but with complaints of fatigue      Exercises General Exercises - Lower Extremity Ankle Circles/Pumps: AROM;Both;10 reps Quad Sets: AROM;Both;10 reps    General Comments        Pertinent Vitals/Pain Pain Assessment: No/denies pain    Home Living                      Prior Function            PT Goals (current goals can now be found in the care plan section) Acute Rehab PT Goals Patient Stated Goal: Get stronger PT Goal Formulation: With patient Time For Goal Achievement: 03/24/18 Potential to Achieve Goals: Good Progress towards PT goals: Progressing toward goals    Frequency    Min 3X/week      PT Plan Current plan remains appropriate    Co-evaluation  AM-PAC PT "6 Clicks" Daily Activity  Outcome Measure  Difficulty turning over in bed (including adjusting bedclothes, sheets and blankets)?: None Difficulty moving from lying on back to sitting on the side of the bed? : A Little Difficulty sitting down on and standing up from a chair with arms (e.g., wheelchair, bedside commode, etc,.)?: Unable Help needed moving to and from a bed to chair (including a wheelchair)?: A Little Help needed walking in hospital room?: A Little Help needed climbing 3-5 steps with a railing? : A Lot 6 Click Score: 16    End of Session Equipment Utilized During Treatment: Gait belt Activity Tolerance: Patient limited by fatigue Patient left: in  bed;with call bell/phone within reach Nurse Communication: Mobility status PT Visit Diagnosis: Other abnormalities of gait and mobility (R26.89);Muscle weakness (generalized) (M62.81)     Time: 9811-9147 PT Time Calculation (min) (ACUTE ONLY): 11 min  Charges:  $Gait Training: 8-22 mins                    G CodesDrucilla Chalet, PT Pager: [phone] 03/17/2018    [facility] 03/17/2018, 10:36 AM

## 2018-03-17 NOTE — Progress Notes (Signed)
ANTICOAGULATION CONSULT NOTE - Follow Up Consult  Pharmacy Consult for warfarin Indication: atrial fibrillation  Allergies  Allergen Reactions  . Tape Other (See Comments)    Tears skin - please use paper tape  . Levaquin [Levofloxacin In D5w] Other (See Comments)    Per patient this caused insomnia     Patient Measurements: Height: 5' (152.4 cm) Weight: 131 lb 13.4 oz (59.8 kg) IBW/kg (Calculated) : 45.5   Vital Signs: Temp: 97.6 F (36.4 C) (04/18 0941) Temp Source: Oral (04/18 0941) BP: 161/71 (04/18 0941) Pulse Rate: 72 (04/18 0941)  Labs: Recent Labs    03/15/18 0627 03/15/18 1645 03/16/18 0629 03/17/18 0607  HGB  --  9.3*  --   --   HCT  --  30.6*  --   --   PLT  --  374  --   --   LABPROT 30.0*  --  31.0* 32.7*  INR 2.89  --  3.01 3.23  CREATININE  --  4.23*  --   --     Estimated Creatinine Clearance: 8.3 mL/min (A) (by C-G formula based on SCr of 4.23 mg/dL (H)).   Medications:  Scheduled:  . acidophilus  1 capsule Oral Daily  . calcitRIOL  0.25 mcg Oral Q M,W,F  . [START ON 03/22/2018] darbepoetin (ARANESP) injection - DIALYSIS  100 mcg Intravenous Q Tue-HD  . famotidine  20 mg Oral Daily  . feeding supplement  1 Container Oral TID BM  . feeding supplement (PRO-STAT SUGAR FREE 64)  30 mL Oral BID  . folic acid  1 mg Oral Daily  . hydrALAZINE  50 mg Oral TID  . lanthanum  500 mg Oral TID WC  . levothyroxine  75 mcg Oral QAC breakfast  . methylPREDNISolone  4 mg Oral Daily  . metoprolol tartrate  100 mg Oral BID  . multivitamin  1 tablet Oral QHS  . sodium chloride flush  3 mL Intravenous Q12H  . Warfarin - Pharmacist Dosing Inpatient   Does not apply q1800   Infusions:  . sodium chloride      Assessment: 84 YOF transferred from [facility] to initiate dialysis. At [facility] she was started on warfarin/heparin bridge for new Afib. INR remains is trending up and is above goal today at 3.23. CBC is stable, no bleeding noted.  Consider regimen  similar to 2.5 mg most days, 3.75 mg 1-2x per week at discharge  Goal of Therapy:  INR 2-3 Monitor platelets by anticoagulation protocol: Yes   Plan:  No Warfarin tonight Daily INR, CBC Monitor s/sx of bleeding   [employer], Pharm.D., BCPS Clinical Pharmacist Pager: [phone] Clinical phone for 03/17/2018 from 8:30-4:00 is [phone]. After 4pm, please call Main Rx [phone]) for assistance. 03/17/2018 10:50 AM

## 2018-03-18 LAB — RENAL FUNCTION PANEL
ANION GAP: 16 — AB (ref 5–15)
Albumin: 2 g/dL — ABNORMAL LOW (ref 3.5–5.0)
BUN: 84 mg/dL — AB (ref 6–20)
CHLORIDE: 96 mmol/L — AB (ref 101–111)
CO2: 18 mmol/L — AB (ref 22–32)
Calcium: 7.9 mg/dL — ABNORMAL LOW (ref 8.9–10.3)
Creatinine, Ser: 3.32 mg/dL — ABNORMAL HIGH (ref 0.44–1.00)
GFR calc Af Amer: 14 mL/min — ABNORMAL LOW (ref >60)
GFR calc non Af Amer: 12 mL/min — ABNORMAL LOW (ref >60)
GLUCOSE: 104 mg/dL — AB (ref 65–99)
POTASSIUM: 3.7 mmol/L (ref 3.5–5.1)
Phosphorus: 7 mg/dL — ABNORMAL HIGH (ref 2.5–4.6)
Sodium: 130 mmol/L — ABNORMAL LOW (ref 135–145)

## 2018-03-18 LAB — CBC
HEMATOCRIT: 29.6 % — AB (ref 36.0–46.0)
HEMOGLOBIN: 9.3 g/dL — AB (ref 12.0–15.0)
MCH: 27.1 pg (ref 26.0–34.0)
MCHC: 31.4 g/dL (ref 30.0–36.0)
MCV: 86.3 fL (ref 78.0–100.0)
Platelets: 292 10*3/uL (ref 150–400)
RBC: 3.43 MIL/uL — ABNORMAL LOW (ref 3.87–5.11)
RDW: 19.3 % — AB (ref 11.5–15.5)
WBC: 28.9 10*3/uL — ABNORMAL HIGH (ref 4.0–10.5)

## 2018-03-18 LAB — PROTIME-INR
INR: 3.5
Prothrombin Time: 34.9 seconds — ABNORMAL HIGH (ref 11.4–15.2)

## 2018-03-18 LAB — ALT: ALT: 22 U/L (ref 14–54)

## 2018-03-18 MED ORDER — ALTEPLASE 2 MG IJ SOLR: 2.0000 mg | Freq: Once | INTRAMUSCULAR | Status: DC | PRN

## 2018-03-18 MED ORDER — PENTAFLUOROPROP-TETRAFLUOROETH EX AERO
1.0000 | INHALATION_SPRAY | CUTANEOUS | Status: DC | PRN
Start: 2018-03-18 — End: 2018-03-21

## 2018-03-18 MED ORDER — BISACODYL 10 MG RE SUPP
10.0000 mg | Freq: Once | RECTAL | Status: AC
  Administered 2018-03-18: 10 mg via RECTAL
  Filled 2018-03-18: qty 1

## 2018-03-18 MED ORDER — SODIUM CHLORIDE 0.9 % IV SOLN: 100.0000 mL | INTRAVENOUS | Status: DC | PRN

## 2018-03-18 MED ORDER — LIDOCAINE HCL (PF) 1 % IJ SOLN: 5.0000 mL | INTRAMUSCULAR | Status: DC | PRN

## 2018-03-18 MED ORDER — POLYETHYLENE GLYCOL 3350 17 G PO PACK
17.0000 g | PACK | Freq: Two times a day (BID) | ORAL | Status: DC
  Administered 2018-03-18 – 2018-03-24 (×9): 17 g via ORAL
  Filled 2018-03-18 (×11): qty 1

## 2018-03-18 MED ORDER — HEPARIN SODIUM (PORCINE) 1000 UNIT/ML DIALYSIS: 1000.0000 [IU] | INTRAMUSCULAR | Status: DC | PRN

## 2018-03-18 MED ORDER — SENNA 8.6 MG PO TABS
1.0000 | ORAL_TABLET | Freq: Every day | ORAL | Status: DC
  Administered 2018-03-19 – 2018-03-23 (×4): 8.6 mg via ORAL
  Filled 2018-03-18 (×5): qty 1

## 2018-03-18 MED ORDER — LIDOCAINE-PRILOCAINE 2.5-2.5 % EX CREA
1.0000 | TOPICAL_CREAM | CUTANEOUS | Status: DC | PRN
Start: 2018-03-18 — End: 2018-03-21

## 2018-03-18 NOTE — Progress Notes (Signed)
PROGRESS NOTE  Rachel Johns ZOX:[id number] DOB: 06-Feb-1934 DOA: 03/09/2018 PCP: Casper Harrison, Stephanie Coup, MD  Brief Narrative: 84 year old woman PMH CKD stage IV transferred from [facility] for worsening renal failure, other issues included atrial fibrillation with rapid ventricular response.  She was seen by nephrology and hemodialysis was initiated.  Also seen by cardiology with adjustments made to medication.  Assessment/Plan Chronic kidney disease stage IV-5 with progression to end-stage renal disease, metabolic acidosis associated. --Continue hemodialysis.  Acute on chronic systolic and diastolic congestive of heart failure.  LVEF 40-45%.  Severe tricuspid regurgitation by report. --Some lower extremity edema but overall appears  Atrial fibrillation, new diagnosis.  Episodes of RVR during this admission. --In sinus rhythm last check.  Continue beta-blocker, warfarin. --PFO? Follow-up as an outpatient.  Persistent leukocytosis.  No evidence of infection.  No evidence of blood dyscrasia. --Etiology unclear.  Will need outpatient follow-up.  Hypothyroidism. Continue levothyroxine.  Rheumatoid arthritis.  Steroid dependent. --Continue methylprednisolone  Anemia of chronic kidney disease. --Hemoglobin stable.  Severe malnutrition --Per dietitian.   Appears stable for transfer to skilled nursing facility once CLIP complete.  DVT prophylaxis: warfarin Code Status: full code Family Communication: none Disposition Plan: SNF    Brendia Sacks, MD  [facility] Direct contact: [phone] --Via amion app OR  --[email or web address]; password [email or web address]  7PM-7AM contact night coverage as above 03/18/2018, 2:08 PM  LOS: 9 days   Consultants:  Nephrology  Cardiology  Procedures:    Antimicrobials:    Interval history/Subjective: Feels okay today except for constipation and some mild abdominal pain.  Perhaps a week or more since last bowel  movement.  Objective: Vitals:  Vitals:   03/18/18 1115 03/18/18 1137  BP: (!) 121/57 (!) 120/56  Pulse: 73 74  Resp: 18 18  Temp:  97.7 F (36.5 C)  SpO2: 99% 100%    Exam:   Constitutional:   . Appears calm, mildly uncomfortable. Respiratory:  . CTA bilaterally, no w/r/r.  . Respiratory effort normal Cardiovascular:  . RRR, no m/r/g . 1+ bilateral LE extremity edema   Abdomen:  . Positive bowel sounds.  Soft, nontender, nondistended. Psychiatric:  . Mental status o Mood, affect appropriate  I have personally reviewed the following:   Labs:  Basic metabolic panel reviewed, potassium within normal limits.  Hemoglobin stable, 9.3  WBC remains elevated, 28.9.  Has been elevated since January 2018 with 1 exception.  INR 3.5, trending up  Scheduled Meds: . acidophilus  1 capsule Oral Daily  . bisacodyl  10 mg Rectal Once  . calcitRIOL  0.25 mcg Oral Q M,W,F  . [START ON 03/22/2018] darbepoetin (ARANESP) injection - DIALYSIS  100 mcg Intravenous Q Tue-HD  . famotidine  20 mg Oral Daily  . feeding supplement  1 Container Oral TID BM  . feeding supplement (PRO-STAT SUGAR FREE 64)  30 mL Oral BID  . folic acid  1 mg Oral Daily  . hydrALAZINE  50 mg Oral TID  . lanthanum  500 mg Oral TID WC  . levothyroxine  75 mcg Oral QAC breakfast  . methylPREDNISolone  4 mg Oral Daily  . metoprolol tartrate  100 mg Oral BID  . multivitamin  1 tablet Oral QHS  . polyethylene glycol  17 g Oral BID  . senna  1 tablet Oral QHS  . sodium chloride flush  3 mL Intravenous Q12H  . Warfarin - Pharmacist Dosing Inpatient   Does not apply q1800   Continuous Infusions: . sodium chloride    .  sodium chloride    . sodium chloride      Principal Problem:   ESRD (end stage renal disease) (HCC) Active Problems:   Rheumatoid arthritis (HCC)   Normocytic anemia   Leukocytosis   Hyponatremia   AF (paroxysmal atrial fibrillation) (HCC)   Acute combined systolic and diastolic CHF, NYHA  class 3 (HCC)   Protein-calorie malnutrition, severe   Pressure ulcer   LOS: 9 days

## 2018-03-18 NOTE — Procedures (Signed)
Patient seen and examined on Hemodialysis. QB 300 mL/ min via AVG 2 17 g needles UF goal 2L  Treatment adjusted as needed.  Bufford Buttner MD [facility] pgr [phone] 7:56 AM

## 2018-03-18 NOTE — Progress Notes (Signed)
Unable to access pt for HD tx. Pt's access arm is swollen and mostly bruised. Observed a knot at venous access site possibly from a previous infiltration. MD orders state for expert cannulator please. As this nurse is an expert cannulator, I was still unable to access. There was no blood return and when deaccessed pulled a small clot w/ it. Called Dr. Ronalee Belts to make him aware and MD states in agreement that this pt should have been done on day shift when multiple nurses are available to try and access if someone else couldn't rather than leave the pt for night shift when only one nurse on shift. Pt is to be brought back to unit for HD tx first pt on in the morning once day shift gets here. All this was explained to pt and she stated, "I was waiting all day for them to come and get me". While I reassured  Her that I was not here and didn't know what the priority list for pts was on day shift yesterday, she would be coming back as soon as day shift arrived.

## 2018-03-18 NOTE — Clinical Social Work Note (Signed)
SNS continuing to work on placement for patient. Call made to son, Rachel Johns [phone]) regarding facility placement and was advised that [facility] is only facility in [facility] that has made a bed offer. Son advised that [facility] (his preference) does not accept HD patients. He asked about [facility] and CSW advised that contact will be made with this SNF as they did not respond. Call made to facility and was advised by admissions director Rachel Johns to contact Rachel Johns-admissions liaison [phone]). Call made to [place] and they will review information and get back with CSW.  Call made to [facility] and talked with Rachel Johns who reported that CLIP process not complete on patient. CSW will continue to follow and facilitate discharge to SNF when ready.

## 2018-03-18 NOTE — Progress Notes (Signed)
Patient ID: Rachel Johns, female   DOB: [birth year], 84 y.o.   MRN: [id number]  [facility] Progress Note   Assessment/ Plan:    1.    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  HD ttoday 4/19, rolled over from 4/18.  Dont' think needs TDC now to rest AVG (placed 3/18 by Dr Darrick Penna)  2.  Acute exacerbation of diastolic heart failure: resolving with initiation of HD  3.  Hypertension: Blood pressure appears to be fairly well controlled on current therapy/HD.  4.   Nutrition:  Albumin 2.1, on protein supps  5.  Anemia of chronic kidney disease: Iron studies with Tsat 28%, ferritin 233, Hgb 9,2--> Aranesp 100 mg   6.  Secondary hyperparathyroidism: PTH level at goal for chronic kidney disease stage V/ESRD.  Begin phosphorus binder for hyperphosphatemia- fosrenol and is also on calcitriol  7.  Afib: On Coumadin and lopressor  8.  Dispo: Ok from renal perspective to d/c as soon as CLIP complete  Subjective:    Couldn't be cannulated last night, cannulated this AM with 2 17 g needles no problem and running smoothly with BFR 300. Pt has no complaints and is feeling well.     Objective:   BP (!) 100/57 (BP Location: Right Arm)   Pulse 84   Temp 97.9 F (36.6 C) (Oral)   Resp 16   Ht 5' (1.524 m)   Wt 59.8 kg (131 lb 13.4 oz)   SpO2 97%   BMI 25.75 kg/m   Intake/Output Summary (Last 24 hours) at 03/18/2018 0752 Last data filed at 03/18/2018 0525 Gross per 24 hour  Intake 600 ml  Output 0 ml  Net 600 ml   Weight change:   Physical Exam: Gen: lying flat on dialysis CVS: Irregularly irregular, normal rate, S1 and S2 normal Resp: Clear to auscultation, no rales/rhonchi. Abd: Soft, flat, nontender Ext: 1+ ankle edema bilaterally, L > R.  Left upper arm AVG with thrill/ bruit,  accessed  Imaging: No results found.  Labs: BMET Recent Labs  Lab 03/11/18 1000  03/11/18 1414 03/12/18 0818 03/13/18 0751 03/14/18 0921 03/15/18 1645  NA 125* 125* 129* 135 132* 130*  K 3.6 3.5 4.0 3.6 3.8 3.9  CL 85* 84* 89* 93* 93* 93*  CO2 17* 19* 22 27 24 22   GLUCOSE 137* 160* 88 98 79 101*  BUN 119* 124* 86* 54* 68* 89*  CREATININE 5.13* 5.08* 4.08* 3.17* 3.75* 4.23*  CALCIUM 8.4* 8.3* 8.3* 8.4* 7.9* 7.9*  PHOS  --  8.4*  --   --  5.4* 6.6*   CBC Recent Labs  Lab 03/11/18 1000  03/12/18 0818 03/13/18 0751 03/14/18 0921 03/15/18 1645  WBC 28.4*   < > 23.8* 19.8* 18.5* 22.0*  NEUTROABS 25.9*  --  21.4* 17.0* 13.1*  --   HGB 9.9*   < > 10.1* 10.4* 9.2* 9.3*  HCT 30.6*   < > 32.7* 33.9* 29.5* 30.6*  MCV 81.8   < > 84.3 86.5 86.8 86.0  PLT 291   < > 270 330 331 374   < > = values in this interval not displayed.    Medications:    . acidophilus  1 capsule Oral Daily  . calcitRIOL  0.25 mcg Oral Q M,W,F  . [START ON 03/22/2018] darbepoetin (ARANESP) injection - DIALYSIS  100 mcg Intravenous Q Tue-HD  . famotidine  20 mg Oral  Daily  . feeding supplement  1 Container Oral TID BM  . feeding supplement (PRO-STAT SUGAR FREE 64)  30 mL Oral BID  . folic acid  1 mg Oral Daily  . hydrALAZINE  50 mg Oral TID  . lanthanum  500 mg Oral TID WC  . levothyroxine  75 mcg Oral QAC breakfast  . methylPREDNISolone  4 mg Oral Daily  . metoprolol tartrate  100 mg Oral BID  . multivitamin  1 tablet Oral QHS  . sodium chloride flush  3 mL Intravenous Q12H  . Warfarin - Pharmacist Dosing Inpatient   Does not apply q1800   Bufford Buttner, MD [facility] pgr [phone] 03/18/2018, 7:52 AM

## 2018-03-18 NOTE — Progress Notes (Signed)
ANTICOAGULATION CONSULT NOTE - Follow Up Consult  Pharmacy Consult for warfarin Indication: atrial fibrillation  Allergies  Allergen Reactions  . Tape Other (See Comments)    Tears skin - please use paper tape  . Levaquin [Levofloxacin In D5w] Other (See Comments)    Per patient this caused insomnia     Patient Measurements: Height: 5' (152.4 cm) Weight: 133 lb 2.5 oz (60.4 kg) IBW/kg (Calculated) : 45.5   Vital Signs: Temp: 98 F (36.7 C) (04/19 0730) Temp Source: Oral (04/19 0730) BP: 109/55 (04/19 1000) Pulse Rate: 67 (04/19 1000)  Labs: Recent Labs    03/15/18 1645 03/16/18 0629 03/17/18 0607 03/18/18 0500 03/18/18 0652 03/18/18 0800  HGB 9.3*  --   --  9.3*  --   --   HCT 30.6*  --   --  29.6*  --   --   PLT 374  --   --  292  --   --   LABPROT  --  31.0* 32.7*  --   --  34.9*  INR  --  3.01 3.23  --   --  3.50  CREATININE 4.23*  --   --   --  3.32*  --     Estimated Creatinine Clearance: 10.6 mL/min (A) (by C-G formula based on SCr of 3.32 mg/dL (H)).   Medications:  Scheduled:  . acidophilus  1 capsule Oral Daily  . calcitRIOL  0.25 mcg Oral Q M,W,F  . [START ON 03/22/2018] darbepoetin (ARANESP) injection - DIALYSIS  100 mcg Intravenous Q Tue-HD  . famotidine  20 mg Oral Daily  . feeding supplement  1 Container Oral TID BM  . feeding supplement (PRO-STAT SUGAR FREE 64)  30 mL Oral BID  . folic acid  1 mg Oral Daily  . hydrALAZINE  50 mg Oral TID  . lanthanum  500 mg Oral TID WC  . levothyroxine  75 mcg Oral QAC breakfast  . methylPREDNISolone  4 mg Oral Daily  . metoprolol tartrate  100 mg Oral BID  . multivitamin  1 tablet Oral QHS  . sodium chloride flush  3 mL Intravenous Q12H  . Warfarin - Pharmacist Dosing Inpatient   Does not apply q1800   Infusions:  . sodium chloride    . sodium chloride    . sodium chloride      Assessment: 84 yo F transferred from [place] to initiate dialysis. At [facility] she was started on warfarin/heparin  bridge for new Afib. INR is trending up and is above goal today at 3.5. CBC is stable, no bleeding noted.  Goal of Therapy:  INR 2-3 Monitor platelets by anticoagulation protocol: Yes   Plan:  No Warfarin tonight Daily INR, CBC Monitor s/sx of bleeding   [employer], Pharm.D., BCPS Clinical Pharmacist Pager: [phone] Clinical phone for 03/18/2018 from 8:30-4:00 is [phone]. After 4pm, please call Main [facility] [phone]) for assistance. 03/18/2018 11:14 AM

## 2018-03-19 LAB — HEPATITIS B CORE ANTIBODY, TOTAL: Hep B Core Total Ab: NEGATIVE

## 2018-03-19 LAB — HEPATITIS B SURFACE ANTIBODY,QUALITATIVE: HEP B S AB: NONREACTIVE

## 2018-03-19 LAB — PROTIME-INR
INR: 3.64
Prothrombin Time: 35.9 seconds — ABNORMAL HIGH (ref 11.4–15.2)

## 2018-03-19 NOTE — Progress Notes (Signed)
PROGRESS NOTE  Rachel Johns VQQ:[id number] DOB: 1934/08/13 DOA: 03/09/2018 PCP: Casper Harrison, Stephanie Coup, MD  Brief Narrative: 84 year old woman PMH CKD stage IV transferred from [facility] for worsening renal failure, other issues included atrial fibrillation with rapid ventricular response.  She was seen by nephrology and hemodialysis was initiated.  Also seen by cardiology with adjustments made to medication.  Assessment/Plan Chronic kidney disease stage IV-5 with progression to end-stage renal disease, metabolic acidosis associated. --Hemodialysis per nephrology.  Acute on chronic systolic and diastolic congestive of heart failure.  LVEF 40-45%.  Severe tricuspid regurgitation by report. --Lower extremity edema is decreasing.  Appears stable.  Atrial fibrillation, new diagnosis.  Episodes of RVR during this admission. --In sinus rhythm last check.  Continue beta-blocker, warfarin. --PFO? Follow-up as an outpatient.  Persistent leukocytosis.  No evidence of infection.  No evidence of blood dyscrasia. --Etiology is unclear, this is been persistent.  There is no evidence of infection.  She is on low-dose methylprednisolone though I doubt this explains. --Repeat CBC in a.m.  Hypothyroidism. Continue levothyroxine.  Rheumatoid arthritis.  Steroid dependent. --Continue methylprednisolone  Anemia of chronic kidney disease. --Hemoglobin has been stable  Severe malnutrition --Per dietitian.   Appears stable for transfer to skilled nursing facility once CLIP complete.  DVT prophylaxis: warfarin Code Status: full code Family Communication: none Disposition Plan: SNF    Brendia Sacks, MD  [facility] Direct contact: [phone] --Via amion app OR  --[email or web address]; password [email or web address]  7PM-7AM contact night coverage as above 03/19/2018, 2:47 PM  LOS: 10 days   Consultants:  Nephrology  Cardiology  Procedures:    Antimicrobials:    Interval  history/Subjective: Feels okay but does report some weeping from both her arms.  Objective: Vitals:  Vitals:   03/19/18 0529 03/19/18 0820  BP: 110/74 114/71  Pulse: 98 (!) 106  Resp: 18 18  Temp: 97.6 F (36.4 C) 97.9 F (36.6 C)  SpO2: 98% 97%    Exam: Constitutional:   . Appears calm and comfortable Respiratory:  . CTA bilaterally, no w/r/r.  . Respiratory effort normal.  Cardiovascular:  . RRR, no m/r/g . 1+ bilateral LE extremity edema.  Improved compared to yesterday.  There is 1+ bilateral arm edema.   Skin:  . Significant ecchymosis left arm Psychiatric:  . Mental status o Mood, affect appropriate . judgement and insight appear intact  I have personally reviewed the following:   Labs:  INR 3.64, slowly trending up  Scheduled Meds: . acidophilus  1 capsule Oral Daily  . calcitRIOL  0.25 mcg Oral Q M,W,F  . [START ON 03/22/2018] darbepoetin (ARANESP) injection - DIALYSIS  100 mcg Intravenous Q Tue-HD  . famotidine  20 mg Oral Daily  . feeding supplement  1 Container Oral TID BM  . feeding supplement (PRO-STAT SUGAR FREE 64)  30 mL Oral BID  . folic acid  1 mg Oral Daily  . hydrALAZINE  50 mg Oral TID  . lanthanum  500 mg Oral TID WC  . levothyroxine  75 mcg Oral QAC breakfast  . methylPREDNISolone  4 mg Oral Daily  . metoprolol tartrate  100 mg Oral BID  . multivitamin  1 tablet Oral QHS  . polyethylene glycol  17 g Oral BID  . senna  1 tablet Oral QHS  . sodium chloride flush  3 mL Intravenous Q12H  . Warfarin - Pharmacist Dosing Inpatient   Does not apply q1800   Continuous Infusions: . sodium chloride    .  sodium chloride    . sodium chloride      Principal Problem:   ESRD (end stage renal disease) (HCC) Active Problems:   Rheumatoid arthritis (HCC)   Normocytic anemia   Leukocytosis   Hyponatremia   AF (paroxysmal atrial fibrillation) (HCC)   Acute combined systolic and diastolic CHF, NYHA class 3 (HCC)   Protein-calorie malnutrition,  severe   Pressure ulcer   LOS: 10 days

## 2018-03-19 NOTE — Progress Notes (Signed)
Patient ID: Rachel Johns, female   DOB: 03/06/1934, 84 y.o.   MRN: [id number] [facility] Progress Note   Assessment/ Plan:    1.    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  HD today 4/19Dont' think needs [facility] now to rest AVG (placed 3/18 by Dr Darrick Penna)  2.  Acute exacerbation of diastolic heart failure: resolving with initiation of HD  3.  Hypertension: Blood pressure appears to be fairly well controlled on current therapy/HD.  4.   Nutrition:  Albumin 2.1, on protein supps  5.  Anemia of chronic kidney disease: Iron studies with Tsat 28%, ferritin 233, Hgb 9,2--> Aranesp 100 mg   6.  Secondary hyperparathyroidism: PTH level at goal for chronic kidney disease stage V/ESRD.  Begin phosphorus binder for hyperphosphatemia- fosrenol and is also on calcitriol  7.  Afib: On Coumadin and lopressor  8.  Leukocytosis- up to 28 yesterday, no fevers or s/sx of infection- watch.  9.  Dispo: Ok from renal perspective to d/c as soon as CLIP complete  Subjective:    Tolerated HD yesterday without issue.     Objective:   BP 114/71 (BP Location: Right Arm)   Pulse (!) 106   Temp 97.9 F (36.6 C) (Oral)   Resp 18   Ht 5' (1.524 m)   Wt 60.5 kg (133 lb 6.1 oz)   SpO2 97%   BMI 26.05 kg/m   Intake/Output Summary (Last 24 hours) at 03/19/2018 0937 Last data filed at 03/19/2018 0840 Gross per 24 hour  Intake 680 ml  Output 1500 ml  Net -820 ml   Weight change:   Physical Exam: Gen: NAD, sleeping, easily arousable CVS: Irregularly irregular, normal rate, S1 and S2 normal Resp: Clear to auo rales/rhonchi. Abd: Soft, flat, nontenderscultation, n Ext: 1+ ankle edema bilaterally, L > R.  Left upper arm AVG with thrill/ bruit, bruising getting better.  Imaging: No results found.  Labs: BMET Recent Labs  Lab 03/13/18 0751 03/14/18 0921 03/15/18 1645 03/18/18 0652   NA 135 132* 130* 130*  K 3.6 3.8 3.9 3.7  CL 93* 93* 93* 96*  CO2 27 24 22  18*  GLUCOSE 98 79 101* 104*  BUN 54* 68* 89* 84*  CREATININE 3.17* 3.75* 4.23* 3.32*  CALCIUM 8.4* 7.9* 7.9* 7.9*  PHOS  --  5.4* 6.6* 7.0*   CBC Recent Labs  Lab 03/13/18 0751 03/14/18 0921 03/15/18 1645 03/18/18 0500  WBC 19.8* 18.5* 22.0* 28.9*  NEUTROABS 17.0* 13.1*  --   --   HGB 10.4* 9.2* 9.3* 9.3*  HCT 33.9* 29.5* 30.6* 29.6*  MCV 86.5 86.8 86.0 86.3  PLT 330 331 374 292    Medications:    . acidophilus  1 capsule Oral Daily  . calcitRIOL  0.25 mcg Oral Q M,W,F  . [START ON 03/22/2018] darbepoetin (ARANESP) injection - DIALYSIS  100 mcg Intravenous Q Tue-HD  . famotidine  20 mg Oral Daily  . feeding supplement  1 Container Oral TID BM  . feeding supplement (PRO-STAT SUGAR FREE 64)  30 mL Oral BID  . folic acid  1 mg Oral Daily  . hydrALAZINE  50 mg Oral TID  . lanthanum  500 mg Oral TID WC  . levothyroxine  75 mcg Oral QAC breakfast  . methylPREDNISolone  4 mg Oral Daily  . metoprolol tartrate  100 mg Oral BID  . multivitamin  1  tablet Oral QHS  . polyethylene glycol  17 g Oral BID  . senna  1 tablet Oral QHS  . sodium chloride flush  3 mL Intravenous Q12H  . Warfarin - Pharmacist Dosing Inpatient   Does not apply q1800   Bufford Buttner, MD [facility] pgr [phone] 03/19/2018, 9:37 AM

## 2018-03-19 NOTE — Progress Notes (Addendum)
ANTICOAGULATION CONSULT NOTE - Follow Up Consult  Pharmacy Consult for warfarin Indication: atrial fibrillation  Allergies  Allergen Reactions  . Tape Other (See Comments)    Tears skin - please use paper tape  . Levaquin [Levofloxacin In D5w] Other (See Comments)    Per patient this caused insomnia     Patient Measurements: Height: 5' (152.4 cm) Weight: 133 lb 6.1 oz (60.5 kg) IBW/kg (Calculated) : 45.5   Vital Signs: Temp: 97.9 F (36.6 C) (04/20 0820) Temp Source: Oral (04/20 0820) BP: 114/71 (04/20 0820) Pulse Rate: 106 (04/20 0820)  Labs: Recent Labs    03/17/18 0607 03/18/18 0500 03/18/18 0652 03/18/18 0800 03/19/18 0537  HGB  --  9.3*  --   --   --   HCT  --  29.6*  --   --   --   PLT  --  292  --   --   --   LABPROT 32.7*  --   --  34.9* 35.9*  INR 3.23  --   --  3.50 3.64  CREATININE  --   --  3.32*  --   --     Estimated Creatinine Clearance: 10.6 mL/min (A) (by C-G formula based on SCr of 3.32 mg/dL (H)).   Medications:  Scheduled:  . acidophilus  1 capsule Oral Daily  . calcitRIOL  0.25 mcg Oral Q M,W,F  . [START ON 03/22/2018] darbepoetin (ARANESP) injection - DIALYSIS  100 mcg Intravenous Q Tue-HD  . famotidine  20 mg Oral Daily  . feeding supplement  1 Container Oral TID BM  . feeding supplement (PRO-STAT SUGAR FREE 64)  30 mL Oral BID  . folic acid  1 mg Oral Daily  . hydrALAZINE  50 mg Oral TID  . lanthanum  500 mg Oral TID WC  . levothyroxine  75 mcg Oral QAC breakfast  . methylPREDNISolone  4 mg Oral Daily  . metoprolol tartrate  100 mg Oral BID  . multivitamin  1 tablet Oral QHS  . polyethylene glycol  17 g Oral BID  . senna  1 tablet Oral QHS  . sodium chloride flush  3 mL Intravenous Q12H  . Warfarin - Pharmacist Dosing Inpatient   Does not apply q1800   Infusions:  . sodium chloride    . sodium chloride    . sodium chloride      Assessment: 84 yo F transferred from [place] to initiate dialysis. At [facility] she was started  on warfarin/heparin bridge for new Afib. INR continues to trend up and is above goal today at 3.64. CBC not ordered today, no bleeding noted.  Goal of Therapy:  INR 2-3 Monitor platelets by anticoagulation protocol: Yes   Plan:  No Warfarin tonight Daily INR, CBC Monitor s/sx of bleeding  Adline Potter, PharmD Pharmacy Resident Pager: [phone]

## 2018-03-20 LAB — CBC
HEMATOCRIT: 25.7 % — AB (ref 36.0–46.0)
Hemoglobin: 8.2 g/dL — ABNORMAL LOW (ref 12.0–15.0)
MCH: 27.7 pg (ref 26.0–34.0)
MCHC: 31.9 g/dL (ref 30.0–36.0)
MCV: 86.8 fL (ref 78.0–100.0)
Platelets: 275 10*3/uL (ref 150–400)
RBC: 2.96 MIL/uL — ABNORMAL LOW (ref 3.87–5.11)
RDW: 19.2 % — AB (ref 11.5–15.5)
WBC: 18.3 10*3/uL — AB (ref 4.0–10.5)

## 2018-03-20 LAB — PROTIME-INR
INR: 2.43
Prothrombin Time: 26.2 seconds — ABNORMAL HIGH (ref 11.4–15.2)

## 2018-03-20 MED ORDER — BISACODYL 10 MG RE SUPP
10.0000 mg | Freq: Every day | RECTAL | Status: DC | PRN
  Filled 2018-03-20: qty 1

## 2018-03-20 MED ORDER — WARFARIN SODIUM 2.5 MG PO TABS: 2.5000 mg | ORAL_TABLET | Freq: Once | ORAL | Status: DC

## 2018-03-20 MED ORDER — WARFARIN SODIUM 3 MG PO TABS
3.0000 mg | ORAL_TABLET | Freq: Once | ORAL | Status: AC
  Administered 2018-03-20: 3 mg via ORAL
  Filled 2018-03-20: qty 1

## 2018-03-20 NOTE — Progress Notes (Signed)
Patient ID: Rachel Johns, female   DOB: 09-26-1934, 84 y.o.   MRN: [id number] [place] [facility] Progress Note   Assessment/ Plan:    1.    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  Next HD planned 4/22.  Dont' think needs TDC now to rest AVG (placed 3/18 by Dr Darrick Penna)  2.  Acute exacerbation of diastolic heart failure: resolving with initiation of HD  3.  Hypertension: Blood pressure appears to be fairly well controlled on current therapy/HD.  4.   Nutrition:  Albumin 2.1, on protein supps  5.  Anemia of chronic kidney disease: Iron studies with Tsat 28%, ferritin 233, Hgb 9,2--> Aranesp 100 mg   6.  Secondary hyperparathyroidism: PTH level at goal for chronic kidney disease stage V/ESRD.  Begin phosphorus binder for hyperphosphatemia- fosrenol and is also on calcitriol  7.  Afib: On Coumadin and lopressor  8.  Leukocytosis- up to 28-->18, no fevers or s/sx of infection- watch.  9.  Constipation: good bowel regimen  10.  Dispo: Ok from renal perspective to d/c as soon as CLIP complete  Subjective:    Reports constipation still today   Objective:   BP 125/74 (BP Location: Right Arm)   Pulse 80   Temp 97.7 F (36.5 C) (Oral)   Resp 18   Ht 5' (1.524 m)   Wt 60.5 kg (133 lb 6.2 oz)   SpO2 95%   BMI 26.05 kg/m   Intake/Output Summary (Last 24 hours) at 03/20/2018 1223 Last data filed at 03/20/2018 0918 Gross per 24 hour  Intake 840 ml  Output 0 ml  Net 840 ml   Weight change: 0.104 kg (3.7 oz)  Physical Exam: Gen: NAD, sleeping, easily arousable CVS: Irregularly irregular, normal rate, S1 and S2 normal Resp: Clear- no rales/rhonchi. Abd: Soft, flat, nontenderscultation, n Ext: 1+ ankle edema bilaterally, L > R.  Left upper arm AVG with thrill/ bruit, bruising getting better.  Imaging: No results found.  Labs: BMET Recent Labs  Lab 03/14/18 0921  03/15/18 1645 03/18/18 0652  NA 132* 130* 130*  K 3.8 3.9 3.7  CL 93* 93* 96*  CO2 24 22 18*  GLUCOSE 79 101* 104*  BUN 68* 89* 84*  CREATININE 3.75* 4.23* 3.32*  CALCIUM 7.9* 7.9* 7.9*  PHOS 5.4* 6.6* 7.0*   CBC Recent Labs  Lab 03/14/18 0921 03/15/18 1645 03/18/18 0500 03/20/18 0346  WBC 18.5* 22.0* 28.9* 18.3*  NEUTROABS 13.1*  --   --   --   HGB 9.2* 9.3* 9.3* 8.2*  HCT 29.5* 30.6* 29.6* 25.7*  MCV 86.8 86.0 86.3 86.8  PLT 331 374 292 275    Medications:    . acidophilus  1 capsule Oral Daily  . calcitRIOL  0.25 mcg Oral Q M,W,F  . [START ON 03/22/2018] darbepoetin (ARANESP) injection - DIALYSIS  100 mcg Intravenous Q Tue-HD  . famotidine  20 mg Oral Daily  . feeding supplement  1 Container Oral TID BM  . feeding supplement (PRO-STAT SUGAR FREE 64)  30 mL Oral BID  . folic acid  1 mg Oral Daily  . hydrALAZINE  50 mg Oral TID  . lanthanum  500 mg Oral TID WC  . levothyroxine  75 mcg Oral QAC breakfast  . methylPREDNISolone  4 mg Oral Daily  . metoprolol tartrate  100 mg Oral BID  . multivitamin  1 tablet Oral QHS  .  polyethylene glycol  17 g Oral BID  . senna  1 tablet Oral QHS  . sodium chloride flush  3 mL Intravenous Q12H  . warfarin  3 mg Oral ONCE-1800  . Warfarin - Pharmacist Dosing Inpatient   Does not apply q1800   Bufford Buttner, MD [facility] pgr [phone] 03/20/2018, 12:23 PM

## 2018-03-20 NOTE — Progress Notes (Signed)
ANTICOAGULATION CONSULT NOTE - Follow Up Consult  Pharmacy Consult for warfarin Indication: atrial fibrillation  Allergies  Allergen Reactions  . Tape Other (See Comments)    Tears skin - please use paper tape  . Levaquin [Levofloxacin In D5w] Other (See Comments)    Per patient this caused insomnia     Patient Measurements: Height: 5' (152.4 cm) Weight: 133 lb 6.2 oz (60.5 kg) IBW/kg (Calculated) : 45.5   Vital Signs: Temp: 97.7 F (36.5 C) (04/21 0727) Temp Source: Oral (04/21 0727) BP: 125/74 (04/21 0727) Pulse Rate: 80 (04/21 0727)  Labs: Recent Labs    03/18/18 0500 03/18/18 0652 03/18/18 0800 03/19/18 0537 03/20/18 0346  HGB 9.3*  --   --   --  8.2*  HCT 29.6*  --   --   --  25.7*  PLT 292  --   --   --  275  LABPROT  --   --  34.9* 35.9* 26.2*  INR  --   --  3.50 3.64 2.43  CREATININE  --  3.32*  --   --   --     Estimated Creatinine Clearance: 10.6 mL/min (A) (by C-G formula based on SCr of 3.32 mg/dL (H)).   Medications:  Scheduled:  . acidophilus  1 capsule Oral Daily  . calcitRIOL  0.25 mcg Oral Q M,W,F  . [START ON 03/22/2018] darbepoetin (ARANESP) injection - DIALYSIS  100 mcg Intravenous Q Tue-HD  . famotidine  20 mg Oral Daily  . feeding supplement  1 Container Oral TID BM  . feeding supplement (PRO-STAT SUGAR FREE 64)  30 mL Oral BID  . folic acid  1 mg Oral Daily  . hydrALAZINE  50 mg Oral TID  . lanthanum  500 mg Oral TID WC  . levothyroxine  75 mcg Oral QAC breakfast  . methylPREDNISolone  4 mg Oral Daily  . metoprolol tartrate  100 mg Oral BID  . multivitamin  1 tablet Oral QHS  . polyethylene glycol  17 g Oral BID  . senna  1 tablet Oral QHS  . sodium chloride flush  3 mL Intravenous Q12H  . warfarin  2.5 mg Oral ONCE-1800  . Warfarin - Pharmacist Dosing Inpatient   Does not apply q1800   Infusions:  . sodium chloride    . sodium chloride    . sodium chloride      Assessment: 84 YOF transferred from [place] to initiate  dialysis. At [facility] she was started on warfarin/heparin bridge for new Afib. INR is within goal this am at 2.43, a decrease from 3.64. The INR had been trending up recently and was held for 3 days. CBC is stable, no bleeding noted.  Consider regimen similar to 2.5 mg most days, 3.75 mg 1-2x per week at discharge  Goal of Therapy:  INR 2-3 Monitor platelets by anticoagulation protocol: Yes   Plan:  Warfarin 3 mg po x 1 tonight Daily INR, CBC Monitor s/sx of bleeding  Adline Potter, PharmD Pharmacy Resident Pager: [phone]

## 2018-03-20 NOTE — Progress Notes (Signed)
PROGRESS NOTE    Wiletta Bermingham  PNT:[id number] DOB: 1934-03-16 DOA: 03/09/2018 PCP: Casper Harrison, Stephanie Coup, MD     Brief Narrative:  Rachel Johns is 84 year old woman PMH CKD stage IV transferred from [facility] for worsening renal failure, other issues included atrial fibrillation with rapid ventricular response.  She was seen by nephrology and hemodialysis was initiated.  Also seen by cardiology with adjustments made to medication. Currently awaiting CLIP process prior to discharge to SNF.   Assessment & Plan:   Principal Problem:   ESRD (end stage renal disease) (HCC) Active Problems:   Rheumatoid arthritis (HCC)   Normocytic anemia   Leukocytosis   Hyponatremia   AF (paroxysmal atrial fibrillation) (HCC)   Acute combined systolic and diastolic CHF, NYHA class 3 (HCC)   Protein-calorie malnutrition, severe   Pressure ulcer   ESRD  --Hemodialysis per nephrology. Await CLIP   Acute on chronic systolic and diastolic congestive of heart failure --LVEF 40-45%.  Severe tricuspid regurgitation by report. --Lower extremity edema is decreasing.  Appears stable.  Atrial fibrillation, new diagnosis --Episodes of RVR during this admission. --Continue beta-blocker, warfarin. --PFO? Follow-up as an outpatient.  Persistent leukocytosis --No evidence of infection.  No evidence of blood dyscrasia.  Etiology is unclear, this is been persistent.  There is no evidence of infection.  She is on low-dose methylprednisolone --Monitor CBC   Hypothyroidism --Continue levothyroxine  Rheumatoid arthritis --Steroid dependent. Continue methylprednisolone  Anemia of chronic kidney disease. --Hemoglobin has been stable  Severe malnutrition --Per dietitian    DVT prophylaxis: Coumadin Code Status: Full Family Communication: No family at bedside Disposition Plan: Pending CLIP, to SNF when ready    Consultants:   Nephrology  Cardiology  Antimicrobials:    Anti-infectives (From admission, onward)   Start     Dose/Rate Route Frequency Ordered Stop   03/10/18 0900  ceFEPIme (MAXIPIME) 1 g in sodium chloride 0.9 % 100 mL IVPB  Status:  Discontinued     1 g 200 mL/hr over 30 Minutes Intravenous Every 24 hours 03/10/18 0712 03/12/18 1017       Subjective: Doing well this morning.  Complained of some diarrhea after taking MiraLAX.  Objective: Vitals:   03/19/18 1630 03/19/18 2038 03/20/18 0505 03/20/18 0727  BP: 102/68 (!) 113/55 126/64 125/74  Pulse: 99 74 69 80  Resp: 18 17 18 18   Temp: 98.6 F (37 C) 98.5 F (36.9 C) 98 F (36.7 C) 97.7 F (36.5 C)  TempSrc: Oral Oral Oral Oral  SpO2: 94% 94% 94% 95%  Weight:  60.5 kg (133 lb 6.2 oz)    Height:        Intake/Output Summary (Last 24 hours) at 03/20/2018 1547 Last data filed at 03/20/2018 1409 Gross per 24 hour  Intake 840 ml  Output 0 ml  Net 840 ml   Filed Weights   03/18/18 1100 03/18/18 2146 03/19/18 2038  Weight: 58.9 kg (129 lb 13.6 oz) 60.5 kg (133 lb 6.1 oz) 60.5 kg (133 lb 6.2 oz)    Examination:  General exam: Appears calm and comfortable  Respiratory system: Clear to auscultation. Respiratory effort normal. Cardiovascular system: S1 & S2 heard, RRR. No JVD, murmurs, rubs, gallops or clicks. No pedal edema. Gastrointestinal system: Abdomen is nondistended, soft and nontender. No organomegaly or masses felt. Normal bowel sounds heard. Central nervous system: Alert and oriented. No focal neurological deficits. Extremities: Symmetric 5 x 5 power. +LUE fistula with bruit  Skin: No rashes, lesions or  ulcers Psychiatry: Judgement and insight appear normal. Mood & affect appropriate.   Data Reviewed: I have personally reviewed following labs and imaging studies  CBC: Recent Labs  Lab 03/14/18 0921 03/15/18 1645 03/18/18 0500 03/20/18 0346  WBC 18.5* 22.0* 28.9* 18.3*  NEUTROABS 13.1*  --   --   --   HGB 9.2* 9.3* 9.3* 8.2*  HCT 29.5* 30.6* 29.6* 25.7*  MCV  86.8 86.0 86.3 86.8  PLT 331 374 292 275   Basic Metabolic Panel: Recent Labs  Lab 03/14/18 0921 03/15/18 1645 03/18/18 0652  NA 132* 130* 130*  K 3.8 3.9 3.7  CL 93* 93* 96*  CO2 24 22 18*  GLUCOSE 79 101* 104*  BUN 68* 89* 84*  CREATININE 3.75* 4.23* 3.32*  CALCIUM 7.9* 7.9* 7.9*  MG 1.9  --   --   PHOS 5.4* 6.6* 7.0*   GFR: Estimated Creatinine Clearance: 10.6 mL/min (A) (by C-G formula based on SCr of 3.32 mg/dL (H)). Liver Function Tests: Recent Labs  Lab 03/14/18 0921 03/15/18 1645 03/18/18 0652  ALT  --   --  22  ALBUMIN 2.1* 2.1* 2.0*   No results for input(s): LIPASE, AMYLASE in the last 168 hours. No results for input(s): AMMONIA in the last 168 hours. Coagulation Profile: Recent Labs  Lab 03/16/18 0629 03/17/18 0607 03/18/18 0800 03/19/18 0537 03/20/18 0346  INR 3.01 3.23 3.50 3.64 2.43   Cardiac Enzymes: No results for input(s): CKTOTAL, CKMB, CKMBINDEX, TROPONINI in the last 168 hours. BNP (last 3 results) No results for input(s): PROBNP in the last 8760 hours. HbA1C: No results for input(s): HGBA1C in the last 72 hours. CBG: No results for input(s): GLUCAP in the last 168 hours. Lipid Profile: No results for input(s): CHOL, HDL, LDLCALC, TRIG, CHOLHDL, LDLDIRECT in the last 72 hours. Thyroid Function Tests: No results for input(s): TSH, T4TOTAL, FREET4, T3FREE, THYROIDAB in the last 72 hours. Anemia Panel: No results for input(s): VITAMINB12, FOLATE, FERRITIN, TIBC, IRON, RETICCTPCT in the last 72 hours. Sepsis Labs: No results for input(s): PROCALCITON, LATICACIDVEN in the last 168 hours.  No results found for this or any previous visit (from the past 240 hour(s)).     Radiology Studies: No results found.    Scheduled Meds: . acidophilus  1 capsule Oral Daily  . calcitRIOL  0.25 mcg Oral Q M,W,F  . [START ON 03/22/2018] darbepoetin (ARANESP) injection - DIALYSIS  100 mcg Intravenous Q Tue-HD  . famotidine  20 mg Oral Daily  .  feeding supplement  1 Container Oral TID BM  . feeding supplement (PRO-STAT SUGAR FREE 64)  30 mL Oral BID  . folic acid  1 mg Oral Daily  . hydrALAZINE  50 mg Oral TID  . lanthanum  500 mg Oral TID WC  . levothyroxine  75 mcg Oral QAC breakfast  . methylPREDNISolone  4 mg Oral Daily  . metoprolol tartrate  100 mg Oral BID  . multivitamin  1 tablet Oral QHS  . polyethylene glycol  17 g Oral BID  . senna  1 tablet Oral QHS  . sodium chloride flush  3 mL Intravenous Q12H  . warfarin  3 mg Oral ONCE-1800  . Warfarin - Pharmacist Dosing Inpatient   Does not apply q1800   Continuous Infusions: . sodium chloride    . sodium chloride    . sodium chloride       LOS: 11 days    Time spent: 35 minutes   Noralee Stain, DO  [facility] [email or web address] Password [facility] 03/20/2018, 3:47 PM

## 2018-03-21 LAB — CBC
HCT: 29.2 % — ABNORMAL LOW (ref 36.0–46.0)
Hemoglobin: 9.4 g/dL — ABNORMAL LOW (ref 12.0–15.0)
MCH: 27.3 pg (ref 26.0–34.0)
MCHC: 32.2 g/dL (ref 30.0–36.0)
MCV: 84.9 fL (ref 78.0–100.0)
PLATELETS: 273 10*3/uL (ref 150–400)
RBC: 3.44 MIL/uL — ABNORMAL LOW (ref 3.87–5.11)
RDW: 18.7 % — ABNORMAL HIGH (ref 11.5–15.5)
WBC: 27.8 10*3/uL — ABNORMAL HIGH (ref 4.0–10.5)

## 2018-03-21 LAB — BASIC METABOLIC PANEL
Anion gap: 18 — ABNORMAL HIGH (ref 5–15)
BUN: 87 mg/dL — AB (ref 6–20)
CO2: 17 mmol/L — ABNORMAL LOW (ref 22–32)
CREATININE: 3.68 mg/dL — AB (ref 0.44–1.00)
Calcium: 8 mg/dL — ABNORMAL LOW (ref 8.9–10.3)
Chloride: 89 mmol/L — ABNORMAL LOW (ref 101–111)
GFR calc Af Amer: 12 mL/min — ABNORMAL LOW (ref >60)
GFR, EST NON AFRICAN AMERICAN: 11 mL/min — AB (ref >60)
GLUCOSE: 84 mg/dL (ref 65–99)
Potassium: 3.4 mmol/L — ABNORMAL LOW (ref 3.5–5.1)
SODIUM: 124 mmol/L — AB (ref 135–145)

## 2018-03-21 LAB — PROTIME-INR
INR: 2.62
PROTHROMBIN TIME: 27.8 s — AB (ref 11.4–15.2)

## 2018-03-21 MED ORDER — DARBEPOETIN ALFA 100 MCG/0.5ML IJ SOSY
PREFILLED_SYRINGE | INTRAMUSCULAR | Status: AC
  Filled 2018-03-21: qty 0.5

## 2018-03-21 MED ORDER — CALCITRIOL 0.25 MCG PO CAPS
ORAL_CAPSULE | ORAL | Status: AC
  Administered 2018-03-21: 0.25 ug
  Filled 2018-03-21: qty 1

## 2018-03-21 MED ORDER — WARFARIN 1.25 MG HALF TABLET: 1.2500 mg | ORAL_TABLET | Freq: Once | ORAL | Status: DC

## 2018-03-21 NOTE — Progress Notes (Signed)
Physical Therapy Treatment Patient Details Name: Rachel Johns MRN: [id number] DOB: March 13, 1934 Today's Date: 03/21/2018    History of Present Illness Pt is an 84 y.o. female admitted 03/09/18 secondary to SOB; found to be in a-fib with RVR. Worked up for AKI on superimposed on CKD; HD initiated 4/12 via LUE AVG. Also with decompensated systolic HF. PMH includes CHF, HTN, CKD.    PT Comments    Pt very weak and fatigued. Unable to amb other than a few sidesteps up side of bed. Reported light headed as well so may have been orthostatic. Continue to recommend SNF at DC.   Follow Up Recommendations  SNF;Supervision/Assistance - 24 hour     Equipment Recommendations  None recommended by PT    Recommendations for Other Services       Precautions / Restrictions Precautions Precautions: Fall Restrictions Weight Bearing Restrictions: No    Mobility  Bed Mobility Overal bed mobility: Needs Assistance Bed Mobility: Sit to Supine;Supine to Sit     Supine to sit: Min assist Sit to supine: Supervision;Min assist   General bed mobility comments: Assist to elevate trunk into sitting. Assist to bring legs back up into bed returning to supine.  Transfers Overall transfer level: Needs assistance Equipment used: Rolling walker (2 wheeled) Transfers: Sit to/from Stand Sit to Stand: Min assist         General transfer comment: Assist to bring hips up and for balance  Ambulation/Gait Ambulation/Gait assistance: Min assist Ambulation Distance (Feet): 2 Feet Assistive device: Rolling walker (2 wheeled) Gait Pattern/deviations: Step-through pattern;Trunk flexed;Decreased step length - right;Decreased step length - left Gait velocity: Decreased Gait velocity interpretation: <1.8 ft/sec, indicate of risk for recurrent falls General Gait Details: Only able to side step up toward head of bed. Pt reports feeling very weak and light headed. Returned to sitting and then to supine. BP when  returned to supine 101/55   Stairs             Wheelchair Mobility    Modified Rankin (Stroke Patients Only)       Balance Overall balance assessment: Needs assistance Sitting-balance support: No upper extremity supported;Feet supported Sitting balance-Leahy Scale: Fair     Standing balance support: During functional activity;Bilateral upper extremity supported Standing balance-Leahy Scale: Poor Standing balance comment: walker and min assist for static standing                            Cognition Arousal/Alertness: Awake/alert Behavior During Therapy: WFL for tasks assessed/performed Overall Cognitive Status: Within Functional Limits for tasks assessed                                        Exercises      General Comments        Pertinent Vitals/Pain Pain Assessment: No/denies pain    Home Living                      Prior Function            PT Goals (current goals can now be found in the care plan section) Progress towards PT goals: Not progressing toward goals - comment(weakness and fatigue and ?orthostatic)    Frequency    Min 2X/week      PT Plan Current plan remains appropriate;Frequency needs to be updated  Co-evaluation              AM-PAC PT "6 Clicks" Daily Activity  Outcome Measure  Difficulty turning over in bed (including adjusting bedclothes, sheets and blankets)?: None Difficulty moving from lying on back to sitting on the side of the bed? : Unable Difficulty sitting down on and standing up from a chair with arms (e.g., wheelchair, bedside commode, etc,.)?: Unable Help needed moving to and from a bed to chair (including a wheelchair)?: Total Help needed walking in hospital room?: Total Help needed climbing 3-5 steps with a railing? : Total 6 Click Score: 9    End of Session Equipment Utilized During Treatment: Gait belt Activity Tolerance: Patient limited by fatigue Patient  left: in bed;with call bell/phone within reach;with bed alarm set Nurse Communication: Mobility status PT Visit Diagnosis: Other abnormalities of gait and mobility (R26.89);Muscle weakness (generalized) (M62.81)     Time: 2831-5176 PT Time Calculation (min) (ACUTE ONLY): 14 min  Charges:  $Therapeutic Activity: 8-22 mins                    G Codes:       [facility] PT [phone]    Angelina Ok [facility] 03/21/2018, 3:24 PM

## 2018-03-21 NOTE — Progress Notes (Signed)
PROGRESS NOTE    Rachel Johns DOB: 03-May-1934 DOA: 03/09/2018 PCP: Casper Harrison, Stephanie Coup, MD     Brief Narrative:  Rachel Johns is 84 year old woman PMH CKD stage IV transferred from [facility] for worsening renal failure, other issues included atrial fibrillation with rapid ventricular response.  She was seen by nephrology and hemodialysis was initiated.  Also seen by cardiology with adjustments made to medication. She is awaiting CLIP process prior to discharge to SNF.   Assessment & Plan:   Principal Problem:   ESRD (end stage renal disease) (HCC) Active Problems:   Rheumatoid arthritis (HCC)   Normocytic anemia   Leukocytosis   Hyponatremia   AF (paroxysmal atrial fibrillation) (HCC)   Acute combined systolic and diastolic CHF, NYHA class 3 (HCC)   Protein-calorie malnutrition, severe   Pressure ulcer   ESRD  --Hemodialysis per nephrology. Await CLIP  --AV graft with increased arterial pressure, discussed with Dr. Arbie Cookey today. Will hold coumadin and continue to watch INR. Plan for shuntogram when INR < 2, tentatively scheduled for Wednesday  Acute on chronic systolic and diastolic congestive of heart failure --LVEF 40-45%.  Severe tricuspid regurgitation by report. --Lower extremity edema is decreasing.  Appears stable.  Atrial fibrillation, new diagnosis --Episodes of RVR during this admission. --Continue beta-blocker, warfarin-hold  --PFO? Follow-up as an outpatient.  Persistent leukocytosis --No evidence of infection.  No evidence of blood dyscrasia.  Etiology is unclear, this is been persistent.  There is no evidence of infection.  She is on low-dose methylprednisolone --Monitor CBC   Hypothyroidism --Continue levothyroxine  Rheumatoid arthritis --Steroid dependent. Continue methylprednisolone  Anemia of chronic kidney disease. --Hemoglobin has been stable  Severe malnutrition --Per dietitian    DVT prophylaxis:  Coumadin Code Status: Full Family Communication: No family at bedside Disposition Plan: Pending CLIP, to SNF when ready. Shuntogram this week when INR < 2   Consultants:   Nephrology  Cardiology  Vascular surgery   Antimicrobials:  Anti-infectives (From admission, onward)   Start     Dose/Rate Route Frequency Ordered Stop   03/10/18 0900  ceFEPIme (MAXIPIME) 1 g in sodium chloride 0.9 % 100 mL IVPB  Status:  Discontinued     1 g 200 mL/hr over 30 Minutes Intravenous Every 24 hours 03/10/18 0712 03/12/18 1017       Subjective: Patient seen in HD. No complaints, hungry. Stools have been less liquid-like in consistency   Objective: Vitals:   03/21/18 1100 03/21/18 1130 03/21/18 1135 03/21/18 1159  BP: (!) 147/77 128/86 128/86 106/77  Pulse: (!) 108 (!) 113 (!) 109 (!) 113  Resp:   19 20  Temp:   98.4 F (36.9 C) 98.2 F (36.8 C)  TempSrc:   Oral Oral  SpO2:   93% 91%  Weight:      Height:        Intake/Output Summary (Last 24 hours) at 03/21/2018 1301 Last data filed at 03/21/2018 1135 Gross per 24 hour  Intake 240 ml  Output 800 ml  Net -560 ml   Filed Weights   03/18/18 2146 03/19/18 2038 03/21/18 0828  Weight: 60.5 kg (133 lb 6.1 oz) 60.5 kg (133 lb 6.2 oz) 61 kg (134 lb 7.7 oz)    Examination: General exam: Appears calm and comfortable  Respiratory system: Clear to auscultation. Respiratory effort normal. Cardiovascular system: S1 & S2 heard, RRR. No JVD, murmurs, rubs, gallops or clicks. No pedal edema. Gastrointestinal system: Abdomen is nondistended, soft and  nontender. No organomegaly or masses felt. Normal bowel sounds heard. Central nervous system: Alert and oriented. No focal neurological deficits. Extremities: Symmetric, LUE graft with HD in process  Skin: No rashes, lesions or ulcers Psychiatry: Judgement and insight appear normal. Mood & affect appropriate.    Data Reviewed: I have personally reviewed following labs and imaging  studies  CBC: Recent Labs  Lab 03/15/18 1645 03/18/18 0500 03/20/18 0346 03/21/18 0710  WBC 22.0* 28.9* 18.3* 27.8*  HGB 9.3* 9.3* 8.2* 9.4*  HCT 30.6* 29.6* 25.7* 29.2*  MCV 86.0 86.3 86.8 84.9  PLT 374 292 275 273   Basic Metabolic Panel: Recent Labs  Lab 03/15/18 1645 03/18/18 0652 03/21/18 0710  NA 130* 130* 124*  K 3.9 3.7 3.4*  CL 93* 96* 89*  CO2 22 18* 17*  GLUCOSE 101* 104* 84  BUN 89* 84* 87*  CREATININE 4.23* 3.32* 3.68*  CALCIUM 7.9* 7.9* 8.0*  PHOS 6.6* 7.0*  --    GFR: Estimated Creatinine Clearance: 9.6 mL/min (A) (by C-G formula based on SCr of 3.68 mg/dL (H)). Liver Function Tests: Recent Labs  Lab 03/15/18 1645 03/18/18 0652  ALT  --  22  ALBUMIN 2.1* 2.0*   No results for input(s): LIPASE, AMYLASE in the last 168 hours. No results for input(s): AMMONIA in the last 168 hours. Coagulation Profile: Recent Labs  Lab 03/17/18 0607 03/18/18 0800 03/19/18 0537 03/20/18 0346 03/21/18 0710  INR 3.23 3.50 3.64 2.43 2.62   Cardiac Enzymes: No results for input(s): CKTOTAL, CKMB, CKMBINDEX, TROPONINI in the last 168 hours. BNP (last 3 results) No results for input(s): PROBNP in the last 8760 hours. HbA1C: No results for input(s): HGBA1C in the last 72 hours. CBG: No results for input(s): GLUCAP in the last 168 hours. Lipid Profile: No results for input(s): CHOL, HDL, LDLCALC, TRIG, CHOLHDL, LDLDIRECT in the last 72 hours. Thyroid Function Tests: No results for input(s): TSH, T4TOTAL, FREET4, T3FREE, THYROIDAB in the last 72 hours. Anemia Panel: No results for input(s): VITAMINB12, FOLATE, FERRITIN, TIBC, IRON, RETICCTPCT in the last 72 hours. Sepsis Labs: No results for input(s): PROCALCITON, LATICACIDVEN in the last 168 hours.  No results found for this or any previous visit (from the past 240 hour(s)).     Radiology Studies: No results found.    Scheduled Meds: . acidophilus  1 capsule Oral Daily  . calcitRIOL  0.25 mcg Oral Q  M,W,F  . [START ON 03/22/2018] darbepoetin (ARANESP) injection - DIALYSIS  100 mcg Intravenous Q Tue-HD  . famotidine  20 mg Oral Daily  . feeding supplement  1 Container Oral TID BM  . feeding supplement (PRO-STAT SUGAR FREE 64)  30 mL Oral BID  . folic acid  1 mg Oral Daily  . hydrALAZINE  50 mg Oral TID  . lanthanum  500 mg Oral TID WC  . levothyroxine  75 mcg Oral QAC breakfast  . methylPREDNISolone  4 mg Oral Daily  . metoprolol tartrate  100 mg Oral BID  . multivitamin  1 tablet Oral QHS  . polyethylene glycol  17 g Oral BID  . senna  1 tablet Oral QHS  . sodium chloride flush  3 mL Intravenous Q12H   Continuous Infusions: . sodium chloride       LOS: 12 days    Time spent: 25 minutes   Noralee Stain, DO [facility] [email or web address] Password [email or web address] 03/21/2018, 1:01 PM

## 2018-03-21 NOTE — Progress Notes (Signed)
Patient ID: Rachel Johns, female   DOB: [birth year], 83 y.o.   MRN: [id number] The patient is known to our service from placement of right upper arm AV Gore-Tex graft by Dr. Darrick Penna on 02/14/2018.  She is now progressed to renal failure assist.  She has been having difficulty with her graft related to high arterial pressures.  She is seen in consultation.  She currently is on hemodialysis and her graft is working.  I explained the need for a time to determine if she does have venous outflow stenosis.  We will coordinate this around her dialysis schedule.  She is on Coumadin related to new onset atrial fibrillation.  INR is currently 2.6.  I spoke with Dr.Choi.  Coumadin will be held and we will plan shuntogram when INR is 2.0 or less.  Tentatively we will schedule this for Wednesday.

## 2018-03-21 NOTE — Progress Notes (Signed)
PT Cancellation Note  Patient Details Name: Rachel Johns MRN: [id number] DOB: [birth year]   Cancelled Treatment:    Reason Eval/Treat Not Completed: Patient at procedure or test/unavailable. Pt in HD   [facility] 03/21/2018, 10:40 AM [facility] PT [phone]

## 2018-03-21 NOTE — Procedures (Signed)
Patient was seen on dialysis and the procedure was supervised.  BFR 250-300  Via AVG with increased arterial pressure. Using 17 G needle.  BP is  135/70. Vascular surgery consult requested.   Patient appears to be tolerating treatment well  Rachel Johns 03/21/2018

## 2018-03-21 NOTE — Progress Notes (Signed)
ANTICOAGULATION CONSULT NOTE - Follow Up Consult  Pharmacy Consult for warfarin Indication: atrial fibrillation  Allergies  Allergen Reactions  . Tape Other (See Comments)    Tears skin - please use paper tape  . Levaquin [Levofloxacin In D5w] Other (See Comments)    Per patient this caused insomnia     Patient Measurements: Height: 5' (152.4 cm) Weight: 134 lb 7.7 oz (61 kg) IBW/kg (Calculated) : 45.5   Vital Signs: Temp: 98.3 F (36.8 C) (04/22 0828) Temp Source: Oral (04/22 0828) BP: 139/71 (04/22 0930) Pulse Rate: 70 (04/22 0930)  Labs: Recent Labs    03/19/18 0537 03/20/18 0346 03/21/18 0710  HGB  --  8.2* 9.4*  HCT  --  25.7* 29.2*  PLT  --  275 273  LABPROT 35.9* 26.2* 27.8*  INR 3.64 2.43 2.62  CREATININE  --   --  3.68*    Estimated Creatinine Clearance: 9.6 mL/min (A) (by C-G formula based on SCr of 3.68 mg/dL (H)).  Assessment: 46 YOF transferred from [place] to initiate dialysis. At [facility] she was started on warfarin/heparin bridge for new Afib. Pharmacy consulted to manage warfarin inpatient.  INR is therapeutic at 2.62 today. No bleeding noted, CBC is stable.  Goal of Therapy:  INR 2-3 Monitor platelets by anticoagulation protocol: Yes   Plan:  Warfarin 1.25 mg PO tonight Daily INR Monitor s/sx of bleeding   Loura Back, PharmD, BCPS Clinical Pharmacist Clinical phone for 03/21/2018 until 4p is [phone] After 4p, please call Main Rx at [phone] for assistance 03/21/2018 10:16 AM

## 2018-03-21 NOTE — Progress Notes (Signed)
[facility] NEPHROLOGY PROGRESS NOTE  Assessment/ Plan: Pt is a 84 y.o. 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  Assessment/Plan:  # ESRD due to progressive CKD secondary to IgA nephropathy:started on hemodialysis on 4/12 via left upper arm AVG, CLIP underway.  Hemodialysis today.  Having difficulty with AV graft with increased arterial pressure.  It was placed on 3/18 by Dr Rachel Johns.  I consulted vascular surgeon for evaluation.  # Acute on chronic systolic and diastolic congestive heart failure: EF of 40 to 45%.  Dialysis with ultrafiltration.  # Anemia: Iron saturation 28%, hemoglobin 9.4.  Iron as per 100 mcg during dialysis.  Monitor CBC.  # Secondary hyperparathyroidism: PTH 266, at goal for ESRD.  Continue calcitriol and Fosrenol.  # HTN/volume: Blood pressure acceptable.  Continue dialysis.  On hydralazine, metoprolol and monitor blood pressure   Subjective: Denied chest pain, shortness of breath, nausea vomiting. Objective Vital signs in last 24 hours: Vitals:   03/20/18 2114 03/21/18 0432 03/21/18 0828 03/21/18 0835  BP: (!) 148/74 (!) 151/76 138/71 135/70  Pulse: 75 73 76 75  Resp: 17 17 16 16   Temp: (!) 97.5 F (36.4 C) 98.2 F (36.8 C) 98.3 F (36.8 C)   TempSrc: Oral  Oral   SpO2: 96% 94% 95%   Weight:   61 kg (134 lb 7.7 oz)   Height:       Weight change:   Intake/Output Summary (Last 24 hours) at 03/21/2018 1003 Last data filed at 03/20/2018 1409 Gross per 24 hour  Intake 240 ml  Output -  Net 240 ml       Labs: Basic Metabolic Panel: Recent Labs  Lab 03/15/18 1645 03/18/18 0652 03/21/18 0710  NA 130* 130* 124*  K 3.9 3.7 3.4*  CL 93* 96* 89*  CO2 22 18* 17*  GLUCOSE 101* 104* 84  BUN 89* 84* 87*  CREATININE 4.23* 3.32* 3.68*  CALCIUM 7.9* 7.9* 8.0*  PHOS 6.6* 7.0*  --    Liver Function Tests: Recent Labs  Lab  03/15/18 1645 03/18/18 0652  ALT  --  22  ALBUMIN 2.1* 2.0*   No results for input(s): LIPASE, AMYLASE in the last 168 hours. No results for input(s): AMMONIA in the last 168 hours. CBC: Recent Labs  Lab 03/15/18 1645 03/18/18 0500 03/20/18 0346 03/21/18 0710  WBC 22.0* 28.9* 18.3* 27.8*  HGB 9.3* 9.3* 8.2* 9.4*  HCT 30.6* 29.6* 25.7* 29.2*  MCV 86.0 86.3 86.8 84.9  PLT 374 292 275 273   Cardiac Enzymes: No results for input(s): CKTOTAL, CKMB, CKMBINDEX, TROPONINI in the last 168 hours. CBG: No results for input(s): GLUCAP in the last 168 hours.  Iron Studies: No results for input(s): IRON, TIBC, TRANSFERRIN, FERRITIN in the last 72 hours. Studies/Results: No results found.  Medications: Infusions: . sodium chloride    . sodium chloride    . sodium chloride      Scheduled Medications: . acidophilus  1 capsule Oral Daily  . calcitRIOL  0.25 mcg Oral Q M,W,F  . [START ON 03/22/2018] darbepoetin (ARANESP) injection - DIALYSIS  100 mcg Intravenous Q Tue-HD  . famotidine  20 mg Oral Daily  . feeding supplement  1 Container Oral TID BM  . feeding supplement (PRO-STAT SUGAR FREE 64)  30 mL Oral BID  . folic acid  1 mg Oral Daily  . hydrALAZINE  50 mg Oral TID  .  lanthanum  500 mg Oral TID WC  . levothyroxine  75 mcg Oral QAC breakfast  . methylPREDNISolone  4 mg Oral Daily  . metoprolol tartrate  100 mg Oral BID  . multivitamin  1 tablet Oral QHS  . polyethylene glycol  17 g Oral BID  . senna  1 tablet Oral QHS  . sodium chloride flush  3 mL Intravenous Q12H  . Warfarin - Pharmacist Dosing Inpatient   Does not apply q1800    have reviewed scheduled and prn medications.  Physical Exam: General: Not in distress Heart: Regular rate rhythm S1-S2 normal Lungs: Clear bilateral, no wheezing Abdomen: Soft, nontender Extremities: Trace lower extremity edema Dialysis Access: Left upper extremity AV graft  Rachel Johns Rachel Johns 03/21/2018,10:03 AM  LOS: 12 days

## 2018-03-22 LAB — BASIC METABOLIC PANEL
ANION GAP: 15 (ref 5–15)
BUN: 83 mg/dL — ABNORMAL HIGH (ref 6–20)
CO2: 19 mmol/L — AB (ref 22–32)
Calcium: 7.9 mg/dL — ABNORMAL LOW (ref 8.9–10.3)
Chloride: 96 mmol/L — ABNORMAL LOW (ref 101–111)
Creatinine, Ser: 3.41 mg/dL — ABNORMAL HIGH (ref 0.44–1.00)
GFR calc non Af Amer: 12 mL/min — ABNORMAL LOW (ref >60)
GFR, EST AFRICAN AMERICAN: 13 mL/min — AB (ref >60)
GLUCOSE: 96 mg/dL (ref 65–99)
POTASSIUM: 3.4 mmol/L — AB (ref 3.5–5.1)
Sodium: 130 mmol/L — ABNORMAL LOW (ref 135–145)

## 2018-03-22 LAB — CBC
HEMATOCRIT: 26.2 % — AB (ref 36.0–46.0)
HEMOGLOBIN: 8.3 g/dL — AB (ref 12.0–15.0)
MCH: 27.3 pg (ref 26.0–34.0)
MCHC: 31.7 g/dL (ref 30.0–36.0)
MCV: 86.2 fL (ref 78.0–100.0)
Platelets: 223 10*3/uL (ref 150–400)
RBC: 3.04 MIL/uL — AB (ref 3.87–5.11)
RDW: 18.5 % — ABNORMAL HIGH (ref 11.5–15.5)
WBC: 25.4 10*3/uL — AB (ref 4.0–10.5)

## 2018-03-22 LAB — PROTIME-INR
INR: 2.87
Prothrombin Time: 29.8 seconds — ABNORMAL HIGH (ref 11.4–15.2)

## 2018-03-22 MED ORDER — POTASSIUM CHLORIDE CRYS ER 20 MEQ PO TBCR
20.0000 meq | EXTENDED_RELEASE_TABLET | Freq: Once | ORAL | Status: AC
  Administered 2018-03-22: 20 meq via ORAL
  Filled 2018-03-22: qty 1

## 2018-03-22 MED ORDER — PHYTONADIONE 5 MG PO TABS
5.0000 mg | ORAL_TABLET | Freq: Once | ORAL | Status: AC
  Administered 2018-03-22: 5 mg via ORAL
  Filled 2018-03-22: qty 1

## 2018-03-22 NOTE — Progress Notes (Signed)
PROGRESS NOTE    Tahra Hitzeman  VCB:[id number] DOB: 08/25/1934 DOA: 03/09/2018 PCP: Casper Harrison, Stephanie Coup, MD     Brief Narrative:  Malie Kashani is 84 year old woman PMH CKD stage IV transferred from [facility] : [facility] for worsening renal failure, other issues included atrial fibrillation with rapid ventricular response.  She was seen by nephrology and hemodialysis was initiated.  Also seen by cardiology with adjustments made to medication. She is awaiting CLIP process prior to discharge to SNF.   Assessment & Plan:   Principal Problem:   ESRD (end stage renal disease) (HCC) Active Problems:   Rheumatoid arthritis (HCC)   Normocytic anemia   Leukocytosis   Hyponatremia   AF (paroxysmal atrial fibrillation) (HCC)   Acute combined systolic and diastolic CHF, NYHA class 3 (HCC)   Protein-calorie malnutrition, severe   Pressure ulcer   ESRD  --Hemodialysis per nephrology. Await CLIP  --AV graft with increased arterial pressure. Vascular surgery planning for shuntogram when INR < 2, tentatively scheduled for Thurs/Friday. Ordered 5mg  vit K today as INR increased overnight  Acute on chronic systolic and diastolic congestive of heart failure --LVEF 40-45%.  Severe tricuspid regurgitation by report. --Lower extremity edema stable  Atrial fibrillation, new diagnosis --Episodes of RVR during this admission. --Continue beta-blocker, warfarin-hold  --PFO? Follow-up as an outpatient.  Persistent leukocytosis --No evidence of infection.  No evidence of blood dyscrasia.  Etiology is unclear, this is been persistent.  There is no evidence of infection.  She is on low-dose methylprednisolone --Monitor CBC   Hypothyroidism --Continue levothyroxine  Rheumatoid arthritis --Steroid dependent. Continue methylprednisolone  Anemia of chronic kidney disease. --Hemoglobin has been stable  Severe malnutrition --Per dietitian  Hypokalemia --Replace, trend     DVT  prophylaxis: Coumadin Code Status: Full Family Communication: No family at bedside Disposition Plan: Pending CLIP, to SNF when ready. Shuntogram this week when INR < 2   Consultants:   Nephrology  Cardiology  Vascular surgery   Antimicrobials:  Anti-infectives (From admission, onward)   Start     Dose/Rate Route Frequency Ordered Stop   03/10/18 0900  ceFEPIme (MAXIPIME) 1 g in sodium chloride 0.9 % 100 mL IVPB  Status:  Discontinued     1 g 200 mL/hr over 30 Minutes Intravenous Every 24 hours 03/10/18 0712 03/12/18 1017       Subjective: No new complaints today; feeling well overall   Objective: Vitals:   03/21/18 1748 03/21/18 2004 03/22/18 0507 03/22/18 0806  BP: 115/69 114/72 (!) 110/57 113/60  Pulse: 75 75 67 68  Resp: 18     Temp: 98 F (36.7 C) 98 F (36.7 C) 97.8 F (36.6 C) 97.8 F (36.6 C)  TempSrc: Oral Oral Oral Oral  SpO2: 96% 95% 93% 92%  Weight:      Height:        Intake/Output Summary (Last 24 hours) at 03/22/2018 1134 Last data filed at 03/22/2018 0908 Gross per 24 hour  Intake 1317 ml  Output 800 ml  Net 517 ml   Filed Weights   03/18/18 2146 03/19/18 2038 03/21/18 0828  Weight: 60.5 kg (133 lb 6.1 oz) 60.5 kg (133 lb 6.2 oz) 61 kg (134 lb 7.7 oz)    Examination: General exam: Appears calm and comfortable  Respiratory system: Clear to auscultation. Respiratory effort normal. Cardiovascular system: S1 & S2 heard, RRR. No JVD, murmurs, rubs, gallops or clicks. +trace pedal edema. Gastrointestinal system: Abdomen is nondistended, soft and nontender. No organomegaly or masses felt.  Normal bowel sounds heard. Central nervous system: Alert and oriented. No focal neurological deficits. Extremities: Symmetric 5 x 5 power. Skin: No rashes, lesions or ulcers Psychiatry: Judgement and insight appear normal. Mood & affect appropriate.    Data Reviewed: I have personally reviewed following labs and imaging studies  CBC: Recent Labs  Lab  03/15/18 1645 03/18/18 0500 03/20/18 0346 03/21/18 0710 03/22/18 0325  WBC 22.0* 28.9* 18.3* 27.8* 25.4*  HGB 9.3* 9.3* 8.2* 9.4* 8.3*  HCT 30.6* 29.6* 25.7* 29.2* 26.2*  MCV 86.0 86.3 86.8 84.9 86.2  PLT 374 292 275 273 223   Basic Metabolic Panel: Recent Labs  Lab 03/15/18 1645 03/18/18 0652 03/21/18 0710 03/22/18 0325  NA 130* 130* 124* 130*  K 3.9 3.7 3.4* 3.4*  CL 93* 96* 89* 96*  CO2 22 18* 17* 19*  GLUCOSE 101* 104* 84 96  BUN 89* 84* 87* 83*  CREATININE 4.23* 3.32* 3.68* 3.41*  CALCIUM 7.9* 7.9* 8.0* 7.9*  PHOS 6.6* 7.0*  --   --    GFR: Estimated Creatinine Clearance: 10.4 mL/min (A) (by C-G formula based on SCr of 3.41 mg/dL (H)). Liver Function Tests: Recent Labs  Lab 03/15/18 1645 03/18/18 0652  ALT  --  22  ALBUMIN 2.1* 2.0*   No results for input(s): LIPASE, AMYLASE in the last 168 hours. No results for input(s): AMMONIA in the last 168 hours. Coagulation Profile: Recent Labs  Lab 03/18/18 0800 03/19/18 0537 03/20/18 0346 03/21/18 0710 03/22/18 0325  INR 3.50 3.64 2.43 2.62 2.87   Cardiac Enzymes: No results for input(s): CKTOTAL, CKMB, CKMBINDEX, TROPONINI in the last 168 hours. BNP (last 3 results) No results for input(s): PROBNP in the last 8760 hours. HbA1C: No results for input(s): HGBA1C in the last 72 hours. CBG: No results for input(s): GLUCAP in the last 168 hours. Lipid Profile: No results for input(s): CHOL, HDL, LDLCALC, TRIG, CHOLHDL, LDLDIRECT in the last 72 hours. Thyroid Function Tests: No results for input(s): TSH, T4TOTAL, FREET4, T3FREE, THYROIDAB in the last 72 hours. Anemia Panel: No results for input(s): VITAMINB12, FOLATE, FERRITIN, TIBC, IRON, RETICCTPCT in the last 72 hours. Sepsis Labs: No results for input(s): PROCALCITON, LATICACIDVEN in the last 168 hours.  No results found for this or any previous visit (from the past 240 hour(s)).     Radiology Studies: No results found.    Scheduled Meds: .  acidophilus  1 capsule Oral Daily  . calcitRIOL  0.25 mcg Oral Q M,W,F  . darbepoetin (ARANESP) injection - DIALYSIS  100 mcg Intravenous Q Tue-HD  . famotidine  20 mg Oral Daily  . feeding supplement  1 Container Oral TID BM  . feeding supplement (PRO-STAT SUGAR FREE 64)  30 mL Oral BID  . folic acid  1 mg Oral Daily  . hydrALAZINE  50 mg Oral TID  . lanthanum  500 mg Oral TID WC  . levothyroxine  75 mcg Oral QAC breakfast  . methylPREDNISolone  4 mg Oral Daily  . metoprolol tartrate  100 mg Oral BID  . multivitamin  1 tablet Oral QHS  . polyethylene glycol  17 g Oral BID  . senna  1 tablet Oral QHS  . sodium chloride flush  3 mL Intravenous Q12H   Continuous Infusions: . sodium chloride       LOS: 13 days    Time spent: 20 minutes   Noralee Stain, DO [facility] [email or web address] Password [facility] 03/22/2018, 11:34 AM

## 2018-03-22 NOTE — Progress Notes (Addendum)
[facility] NEPHROLOG[facility] PROGRESS NOTE  Assessment/ Plan: Pt is a 84 y.o. 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 Having difficulty with AV graft with increased arterial pressure.  It was placed on 3/18 by Dr Darrick Penna. Plan for AVG study on Thurs/friday depending on INR <2. INR still high, receiving vitamin K.  Vascular consult appreciated.  Plan for another dialysis tomorrow.    Patient is accepted at [facility], TTS 2nd shift.  # Acute on chronic systolic and diastolic congestive heart failure: EF of 40 to 45%.  Dialysis with ultrafiltration.  # Anemia: Iron saturation 28%, continue aranesp 100 mcg during dialysis.  Monitor CBC.  # Secondary hyperparathyroidism: PTH 266, at goal for ESRD.  Continue calcitriol and Fosrenol.  # HTN/volume: Blood pressure acceptable.  Continue dialysis.  On hydralazine, metoprolol and monitor blood pressure   Subjective: No new event.  Denies headache, dizziness, chest pain or shortness of breath. Objective Vital signs in last 24 hours: Vitals:   03/21/18 1748 03/21/18 2004 03/22/18 0507 03/22/18 0806  BP: 115/69 114/72 (!) 110/57 113/60  Pulse: 75 75 67 68  Resp: 18     Temp: 98 F (36.7 C) 98 F (36.7 C) 97.8 F (36.6 C) 97.8 F (36.6 C)  TempSrc: Oral Oral Oral Oral  SpO2: 96% 95% 93% 92%  Weight:      Height:       Weight change:   Intake/Output Summary (Last 24 hours) at 03/22/2018 1550 Last data filed at 03/22/2018 1430 Gross per 24 hour  Intake 1317 ml  Output 0 ml  Net 1317 ml       Labs: Basic Metabolic Panel: Recent Labs  Lab 03/15/18 1645 03/18/18 0652 03/21/18 0710 03/22/18 0325  NA 130* 130* 124* 130*  K 3.9 3.7 3.4* 3.4*  CL 93* 96* 89* 96*  CO2 22 18* 17* 19*   GLUCOSE 101* 104* 84 96  BUN 89* 84* 87* 83*  CREATININE 4.23* 3.32* 3.68* 3.41*  CALCIUM 7.9* 7.9* 8.0* 7.9*  PHOS 6.6* 7.0*  --   --    Liver Function Tests: Recent Labs  Lab 03/15/18 1645 03/18/18 0652  ALT  --  22  ALBUMIN 2.1* 2.0*   No results for input(s): LIPASE, AMYLASE in the last 168 hours. No results for input(s): AMMONIA in the last 168 hours. CBC: Recent Labs  Lab 03/15/18 1645 03/18/18 0500 03/20/18 0346 03/21/18 0710 03/22/18 0325  WBC 22.0* 28.9* 18.3* 27.8* 25.4*  HGB 9.3* 9.3* 8.2* 9.4* 8.3*  HCT 30.6* 29.6* 25.7* 29.2* 26.2*  MCV 86.0 86.3 86.8 84.9 86.2  PLT 374 292 275 273 223   Cardiac Enzymes: No results for input(s): CKTOTAL, CKMB, CKMBINDEX, TROPONINI in the last 168 hours. CBG: No results for input(s): GLUCAP in the last 168 hours.  Iron Studies: No results for input(s): IRON, TIBC, TRANSFERRIN, FERRITIN in the last 72 hours. Studies/Results: No results found.  Medications: Infusions: . sodium chloride      Scheduled Medications: . acidophilus  1 capsule Oral Daily  . calcitRIOL  0.25 mcg Oral Q M,W,F  . darbepoetin (ARANESP) injection - DIALYSIS  100 mcg Intravenous Q Tue-HD  . famotidine  20 mg Oral Daily  . feeding supplement  1 Container Oral TID BM  . feeding supplement (  PRO-STAT SUGAR FREE 64)  30 mL Oral BID  . folic acid  1 mg Oral Daily  . hydrALAZINE  50 mg Oral TID  . lanthanum  500 mg Oral TID WC  . levothyroxine  75 mcg Oral QAC breakfast  . methylPREDNISolone  4 mg Oral Daily  . metoprolol tartrate  100 mg Oral BID  . multivitamin  1 tablet Oral QHS  . polyethylene glycol  17 g Oral BID  . senna  1 tablet Oral QHS  . sodium chloride flush  3 mL Intravenous Q12H    have reviewed scheduled and prn medications.  Physical Exam: General: In distress lying in bed comfortable Heart: Regular rate rhythm S1-S2 normal.   Lungs: Bilateral, no wheezing Abdomen: Soft, nontender Extremities: No extremity edema Dialysis  Access: Left upper extremity AV graft with thrill.  Tayo Maute Prasad Nakoa Ganus 03/22/2018,3:50 PM  LOS: 13 days

## 2018-03-22 NOTE — Progress Notes (Signed)
   03/22/18 1000  Clinical Encounter Type  Visited With Patient  Visit Type Initial  Referral From Chaplain  Consult/Referral To Chaplain  Spiritual Encounters  Spiritual Needs Prayer;Emotional  Stress Factors  Patient Stress Factors Exhausted    Pt was alone laying in her bed. No family on-site. Chaplain and Pt had a conversation and Pt had concerns about her dialysis which seemingly wasn't going well. Chaplain provided emotional support through reflective listening and compassionate presence.

## 2018-03-22 NOTE — Progress Notes (Addendum)
Pt scheduled to have shuntogram tomorrow, however, INR has increased despite no coumadin.  Will cancel and plan again for Thursday or Friday depending on INR.  Spoke with pt and she is in agreement.  Nadiah Corbit, PAC 03/22/2018 9:41 AM  

## 2018-03-22 NOTE — H&P (View-Only) (Signed)
Pt scheduled to have shuntogram tomorrow, however, INR has increased despite no coumadin.  Will cancel and plan again for Thursday or Friday depending on INR.  Spoke with pt and she is in agreement.  Doreatha Massed, [facility] 03/22/2018 9:41 AM

## 2018-03-23 ENCOUNTER — Encounter (HOSPITAL_COMMUNITY)
Admission: AD | Disposition: A | Discharge status: Skilled Nursing Facility | Payer: Self-pay | Source: Other Acute Inpatient Hospital | Attending: Internal Medicine

## 2018-03-23 ENCOUNTER — Encounter (HOSPITAL_COMMUNITY): Payer: Self-pay | Admitting: Vascular Surgery

## 2018-03-23 HISTORY — PX: A/V SHUNTOGRAM: CATH118297

## 2018-03-23 LAB — BASIC METABOLIC PANEL
ANION GAP: 18 — AB (ref 5–15)
BUN: 99 mg/dL — ABNORMAL HIGH (ref 6–20)
CO2: 16 mmol/L — ABNORMAL LOW (ref 22–32)
Calcium: 8.1 mg/dL — ABNORMAL LOW (ref 8.9–10.3)
Chloride: 91 mmol/L — ABNORMAL LOW (ref 101–111)
Creatinine, Ser: 3.9 mg/dL — ABNORMAL HIGH (ref 0.44–1.00)
GFR, EST AFRICAN AMERICAN: 11 mL/min — AB (ref >60)
GFR, EST NON AFRICAN AMERICAN: 10 mL/min — AB (ref >60)
Glucose, Bld: 83 mg/dL (ref 65–99)
POTASSIUM: 4.2 mmol/L (ref 3.5–5.1)
SODIUM: 125 mmol/L — AB (ref 135–145)

## 2018-03-23 LAB — CBC
HCT: 24.8 % — ABNORMAL LOW (ref 36.0–46.0)
Hemoglobin: 8 g/dL — ABNORMAL LOW (ref 12.0–15.0)
MCH: 27.5 pg (ref 26.0–34.0)
MCHC: 32.3 g/dL (ref 30.0–36.0)
MCV: 85.2 fL (ref 78.0–100.0)
PLATELETS: 264 10*3/uL (ref 150–400)
RBC: 2.91 MIL/uL — AB (ref 3.87–5.11)
RDW: 18.4 % — ABNORMAL HIGH (ref 11.5–15.5)
WBC: 28.7 10*3/uL — ABNORMAL HIGH (ref 4.0–10.5)

## 2018-03-23 LAB — PROTIME-INR
INR: 1.62
PROTHROMBIN TIME: 19.1 s — AB (ref 11.4–15.2)

## 2018-03-23 SURGERY — A/V SHUNTOGRAM
Anesthesia: LOCAL

## 2018-03-23 MED ORDER — ACETAMINOPHEN 325 MG PO TABS
650.0000 mg | ORAL_TABLET | ORAL | Status: DC | PRN
  Administered 2018-03-23 – 2018-03-25 (×3): 650 mg via ORAL
  Filled 2018-03-23 (×2): qty 2

## 2018-03-23 MED ORDER — SODIUM CHLORIDE 0.9 % IV SOLN: 100.0000 mL | INTRAVENOUS | Status: DC | PRN

## 2018-03-23 MED ORDER — SODIUM CHLORIDE 0.9% FLUSH: 3.0000 mL | INTRAVENOUS | Status: DC | PRN

## 2018-03-23 MED ORDER — ONDANSETRON HCL 4 MG/2ML IJ SOLN: 4.0000 mg | Freq: Four times a day (QID) | INTRAMUSCULAR | Status: DC | PRN

## 2018-03-23 MED ORDER — HEPARIN SODIUM (PORCINE) 1000 UNIT/ML DIALYSIS: 1000.0000 [IU] | INTRAMUSCULAR | Status: DC | PRN

## 2018-03-23 MED ORDER — SODIUM CHLORIDE 0.9% FLUSH: 3.0000 mL | Freq: Two times a day (BID) | INTRAVENOUS | Status: DC

## 2018-03-23 MED ORDER — LIDOCAINE HCL (PF) 1 % IJ SOLN
INTRAMUSCULAR | Status: DC | PRN
  Administered 2018-03-23: 2 mL via SUBCUTANEOUS

## 2018-03-23 MED ORDER — LABETALOL HCL 5 MG/ML IV SOLN: 10.0000 mg | INTRAVENOUS | Status: DC | PRN

## 2018-03-23 MED ORDER — WARFARIN - PHARMACIST DOSING INPATIENT: Freq: Every day | Status: DC

## 2018-03-23 MED ORDER — HEPARIN (PORCINE) IN NACL 2-0.9 UNITS/ML
INTRAMUSCULAR | Status: AC | PRN
  Administered 2018-03-23: 500 mL

## 2018-03-23 MED ORDER — DARBEPOETIN ALFA 100 MCG/0.5ML IJ SOSY
100.0000 ug | PREFILLED_SYRINGE | INTRAMUSCULAR | Status: DC
  Administered 2018-03-23: 100 ug via INTRAVENOUS
  Filled 2018-03-23: qty 0.5

## 2018-03-23 MED ORDER — ALTEPLASE 2 MG IJ SOLR: 2.0000 mg | Freq: Once | INTRAMUSCULAR | Status: DC | PRN

## 2018-03-23 MED ORDER — LIDOCAINE HCL (PF) 1 % IJ SOLN: 5.0000 mL | INTRAMUSCULAR | Status: DC | PRN

## 2018-03-23 MED ORDER — DARBEPOETIN ALFA 100 MCG/0.5ML IJ SOSY
PREFILLED_SYRINGE | INTRAMUSCULAR | Status: AC
  Filled 2018-03-23: qty 0.5

## 2018-03-23 MED ORDER — LIDOCAINE-PRILOCAINE 2.5-2.5 % EX CREA: 1.0000 "application " | TOPICAL_CREAM | CUTANEOUS | Status: DC | PRN

## 2018-03-23 MED ORDER — HYDRALAZINE HCL 20 MG/ML IJ SOLN: 5.0000 mg | INTRAMUSCULAR | Status: DC | PRN

## 2018-03-23 MED ORDER — HEPARIN (PORCINE) IN NACL 1000-0.9 UT/500ML-% IV SOLN
INTRAVENOUS | Status: AC
  Filled 2018-03-23: qty 500

## 2018-03-23 MED ORDER — HEPARIN (PORCINE) IN NACL 100-0.45 UNIT/ML-% IJ SOLN: 900.0000 [IU]/h | INTRAMUSCULAR | Status: DC

## 2018-03-23 MED ORDER — IODIXANOL 320 MG/ML IV SOLN
INTRAVENOUS | Status: DC | PRN
  Administered 2018-03-23: 60 mL via INTRAVENOUS

## 2018-03-23 MED ORDER — PENTAFLUOROPROP-TETRAFLUOROETH EX AERO: 1.0000 "application " | INHALATION_SPRAY | CUTANEOUS | Status: DC | PRN

## 2018-03-23 MED ORDER — LIDOCAINE HCL (PF) 1 % IJ SOLN
INTRAMUSCULAR | Status: AC
  Filled 2018-03-23: qty 30

## 2018-03-23 MED ORDER — SODIUM CHLORIDE 0.9 % IV SOLN: 250.0000 mL | INTRAVENOUS | Status: DC | PRN

## 2018-03-23 MED ORDER — WARFARIN SODIUM 2.5 MG PO TABS
2.5000 mg | ORAL_TABLET | Freq: Once | ORAL | Status: AC
  Administered 2018-03-24: 2.5 mg via ORAL
  Filled 2018-03-23: qty 1

## 2018-03-23 SURGICAL SUPPLY — 10 items
BAG SNAP BAND KOVER 36X36 (MISCELLANEOUS) ×2 IMPLANT
COVER DOME SNAP 22 D (MISCELLANEOUS) ×2 IMPLANT
COVER PRB 48X5XTLSCP FOLD TPE (BAG) ×1 IMPLANT
COVER PROBE 5X48 (BAG) ×1
KIT MICROPUNCTURE NIT STIFF (SHEATH) ×2 IMPLANT
PROTECTION STATION PRESSURIZED (MISCELLANEOUS) ×2
STATION PROTECTION PRESSURIZED (MISCELLANEOUS) ×1 IMPLANT
STOPCOCK MORSE 400PSI 3WAY (MISCELLANEOUS) ×2 IMPLANT
TRAY PV CATH (CUSTOM PROCEDURE TRAY) ×2 IMPLANT
TUBING CIL FLEX 10 FLL-RA (TUBING) ×2 IMPLANT

## 2018-03-23 NOTE — Progress Notes (Signed)
[facility] NEPHR[facility] PROGRESS NOTE  Assessment/ Plan: Pt is a 84 y.o. 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 Having difficulty with AV graft with increased arterial pressure.  It was placed on 3/18 by Dr Darrick Penna. Plan for AVG study likely today as INR is 1.6.  Vascular consult appreciated.  -HD today, low blood flow. Patient is accepted at [facility], TTS 2nd shift.  # Acute on chronic systolic and diastolic congestive heart failure: EF of 40 to 45%.  Dialysis with ultrafiltration.  # Anemia: Iron saturation 28%, continue aranesp 100 mcg during dialysis.  Monitor CBC.  # Secondary hyperparathyroidism: PTH 266, at goal for ESRD.  Continue calcitriol and Fosrenol.  # HTN/volume: Blood pressure acceptable.  Continue dialysis.  On hydralazine, metoprolol and monitor blood pressure   Subjective: No new event.  Denies chest pain, shortness of breath, nausea vomiting.  Objective Vital signs in last 24 hours: Vitals:   03/23/18 0930 03/23/18 1000 03/23/18 1030 03/23/18 1044  BP: 119/66 (!) 103/50 (!) 110/50 (!) 117/58  Pulse: 72 70 72 72  Resp:    18  Temp:    98 F (36.7 C)  TempSrc:    Oral  SpO2:    96%  Weight:    61 kg (134 lb 7.7 oz)  Height:       Weight change: 0 kg (0 lb)  Intake/Output Summary (Last 24 hours) at 03/23/2018 1113 Last data filed at 03/23/2018 1044 Gross per 24 hour  Intake 480 ml  Output 700 ml  Net -220 ml       Labs: Basic Metabolic Panel: Recent Labs  Lab 03/18/18 0652 03/21/18 0710 03/22/18 0325 03/23/18 0427  NA 130* 124* 130* 125*  K 3.7 3.4* 3.4* 4.2  CL 96* 89* 96* 91*  CO2 18* 17* 19* 16*  GLUCOSE 104* 84 96 83  BUN 84* 87* 83* 99*  CREATININE 3.32* 3.68* 3.41* 3.90*   CALCIUM 7.9* 8.0* 7.9* 8.1*  PHOS 7.0*  --   --   --    Liver Function Tests: Recent Labs  Lab 03/18/18 0652  ALT 22  ALBUMIN 2.0*   No results for input(s): LIPASE, AMYLASE in the last 168 hours. No results for input(s): AMMONIA in the last 168 hours. CBC: Recent Labs  Lab 03/18/18 0500 03/20/18 0346 03/21/18 0710 03/22/18 0325 03/23/18 0427  WBC 28.9* 18.3* 27.8* 25.4* 28.7*  HGB 9.3* 8.2* 9.4* 8.3* 8.0*  HCT 29.6* 25.7* 29.2* 26.2* 24.8*  MCV 86.3 86.8 84.9 86.2 85.2  PLT 292 275 273 223 264   Cardiac Enzymes: No results for input(s): CKTOTAL, CKMB, CKMBINDEX, TROPONINI in the last 168 hours. CBG: No results for input(s): GLUCAP in the last 168 hours.  Iron Studies: No results for input(s): IRON, TIBC, TRANSFERRIN, FERRITIN in the last 72 hours. Studies/Results: No results found.  Medications: Infusions: . sodium chloride    . sodium chloride    . sodium chloride      Scheduled Medications: . acidophilus  1 capsule Oral Daily  . calcitRIOL  0.25 mcg Oral Q M,W,F  . darbepoetin (ARANESP) injection - DIALYSIS  100 mcg Intravenous Q Wed-HD  . famotidine  20 mg Oral Daily  . feeding supplement  1 Container Oral TID BM  . feeding supplement (  PRO-STAT SUGAR FREE 64)  30 mL Oral BID  . folic acid  1 mg Oral Daily  . hydrALAZINE  50 mg Oral TID  . lanthanum  500 mg Oral TID WC  . levothyroxine  75 mcg Oral QAC breakfast  . methylPREDNISolone  4 mg Oral Daily  . metoprolol tartrate  100 mg Oral BID  . multivitamin  1 tablet Oral QHS  . polyethylene glycol  17 g Oral BID  . senna  1 tablet Oral QHS  . sodium chloride flush  3 mL Intravenous Q12H    have reviewed scheduled and prn medications.  Physical Exam: General: Not distress Lungs: Clear bilateral, no wheezing Heart: Regular rate rhythm S1-S2 normal. Abdomen: Soft, nontender Extremities: No extremity edema Dialysis Access: Left upper extremity AV graft with thrill.  Rachel Johns  Rachel Johns 03/23/2018,11:13 AM  LOS: 14 days

## 2018-03-23 NOTE — Clinical Social Work Placement (Signed)
   CLINICAL SOCIAL WORK PLACEMENT  NOTE  Date:  03/23/2018  Patient Details  Name: Rachel Johns MRN: [id number] Date of Birth: [birth year]  Clinical Social Work is seeking post-discharge placement for this patient at the Skilled  Nursing Facility level of care (*CSW will initial, date and re-position this form in  chart as items are completed):  No(Son provided with SNF information by phone)   Patient/family provided with [facility] Clinical Social Work Department's list of facilities offering this level of care within the geographic area requested by the patient (or if unable, by the patient's family).  Yes   Patient/family informed of their freedom to choose among providers that offer the needed level of care, that participate in Medicare, Medicaid or managed care program needed by the patient, have an available bed and are willing to accept the patient.  No   Patient/family informed of [facility]'s ownership interest in [facility] and [facility], as well as of the fact that they are under no obligation to receive care at these facilities.  PASRR submitted to EDS on       PASRR number received on       Existing PASRR number confirmed on 03/16/18     FL2 transmitted to all facilities in geographic area requested by pt/family on 03/16/18     FL2 transmitted to all facilities within larger geographic area on       Patient informed that his/her managed care company has contracts with or will negotiate with certain facilities, including the following:        Yes   Patient/family informed of bed offers received.  Patient chooses bed at (Son's preference is [facility])     Physician recommends and patient chooses bed at      Patient to be transferred to   on  .  Patient to be transferred to facility by       Patient family notified on   of transfer.  Name of family member notified:        PHYSICIAN       Additional Comment:     _______________________________________________ Cristobal Goldmann, LCSW 03/23/2018, 3:20 PM

## 2018-03-23 NOTE — Progress Notes (Signed)
Nutrition Follow-up  DOCUMENTATION CODES:   Severe malnutrition in context of chronic illness  INTERVENTION:   Continue Boost Breeze po TID, each supplement provides 250 kcal and 9 grams of protein  Continue Rena-Vite  Continue Pro-Stat BID   NUTRITION DIAGNOSIS:   Severe Malnutrition related to chronic illness(newly diagnosed ESRD on HD, CHF) as evidenced by moderate fat depletion, severe fat depletion, moderate muscle depletion, severe muscle depletion.  Being addressed via supplements  GOAL:   Patient will meet greater than or equal to 90% of their needs  Progressing  MONITOR:   PO intake, Supplement acceptance, I & O's, Skin, Weight trends  REASON FOR ASSESSMENT:   Malnutrition Screening Tool    ASSESSMENT:   84 year old female with PMH significant for HTN, CKD stage IV, RA, hypothyroidism, and CHF who presented to [facility] on 03/02/18 with worsening SOB, lower extremity edema, and a 7 lb weight gain. Pt transferred to [facility] for need of hemodialysis.  4/24 HD  4/24 Left arm shuntogram  Recorded po intake of meals 75-100%, but does not always order a lot at meal times. Pt also taking Boost Breeze, Pro-stat  Weight trending back up, actually above admission wt of 134 pounds. Pt is net positive 1 L since admission per I/O flow sheet but noted unmeasured urine occurrences. Noted pt with mild edema in all extremeties   Phosphorus remains high post initiation of Fosrenol. Noted pt low blood flow with HD related to poorly functioning AV graft, likely contributing to altered lab values and fluid gains Pt on Renal Diet   Labs: sodium 125, phosphorus 7.0 Meds: calcitriol, folic acid, fosrenol, rena-vit, acidophilus  Diet Order:  Fall precautions Diet NPO time specified Diet renal with fluid restriction Fluid restriction: 1200 mL Fluid; Room service appropriate? Yes; Fluid consistency: Thin  EDUCATION NEEDS:   No education needs have been identified at  this time  Skin:  Skin Assessment: Skin Integrity Issues: Skin Integrity Issues:: Stage II, Incisions Stage II: coccyx Incisions: closed incision to left arm  Last BM:  4/23  Height:   Ht Readings from Last 1 Encounters:  03/10/18 5' (1.524 m)    Weight:   Wt Readings from Last 1 Encounters:  03/23/18 134 lb 7.7 oz (61 kg)    Ideal Body Weight:  56.8 kg  BMI:  Body mass index is 26.26 kg/m.  Estimated Nutritional Needs:   Kcal:  1600-1800 kcal/day  Protein:  75-90 grams/day  Fluid:  1000 mL plus UOP  [employer] MS, RD, LDN, CNSC [phone] Pager  [phone] Weekend/On-Call Pager

## 2018-03-23 NOTE — Op Note (Signed)
Procedure: Left arm shuntogram  Preoperative diagnosis: Poorly functioning left arm AV graft  Postoperative diagnosis: Same  Anesthesia: Local  Operative findings: No significant venous outflow or arterial inflow narrowing  Operative details: After obtaining informed consent, the patient was taken the PV lab.  The patient was placed in supine position Angio table.  Patient's left upper extremity is prepped and draped in usual sterile fashion.  Local anesthesia was infiltrated over the proximal aspect of the graft just above the antecubital crease.  Micropuncture needle was used to cannulate the graft using ultrasound guidance.  Micropuncture wire was easily advanced into the graft.  Micropuncture sheath was placed over this.  Contrast angiogram was then obtained of the venous outflow as well as the arterial inflow.  The innominate superior vena cava axillary all central veins are widely patent.  It was difficult to completely view the venous outflow anastomosis but after several views I felt fairly confident that we could identify no significant venous anastomotic narrowing.  The anastomosis is to a large axillary vein.  Compression was then held on the upper portion of the graft to reflux contrast across the arterial anastomosis.  This was widely patent.  At this point the micropuncture needle was removed and hemostasis obtained with direct pressure.  The patient tolerated the procedure well and there were no complications.  Operative management: Patient's AV graft is widely patent with smoking Arterial and venous limbs.  The graft is ready for use.  Fabienne Bruns, MD [facility] Office: [phone] Pager: [phone]

## 2018-03-23 NOTE — Progress Notes (Addendum)
OT Cancellation Note  Patient Details Name: Rachel Johns MRN: [id number] DOB: 13-Apr-1934   Cancelled Treatment:    Reason Eval/Treat Not Completed: Patient at procedure or test/ unavailable. Pt in HD earlier and now in cath lab. Will follow.  Evern Bio 03/23/2018, 11:40 AM  03/23/2018 Martie Round, OTR/L Pager: [phone]

## 2018-03-23 NOTE — Progress Notes (Signed)
Ok to resume warfarin at prior dose.  Would give first dose tomorrow to avoid bleeding complications from today's procedure  Fabienne Bruns

## 2018-03-23 NOTE — Interval H&P Note (Signed)
History and Physical Interval Note:  03/23/2018 12:07 PM  Rachel Johns  has presented today for surgery, with the diagnosis of poor flow in fistula  The various methods of treatment have been discussed with the patient and family. After consideration of risks, benefits and other options for treatment, the patient has consented to  Procedure(s): A/V SHUNTOGRAM - Left Upper Arm (N/A) as a surgical intervention .  The patient's history has been reviewed, patient examined, no change in status, stable for surgery.  I have reviewed the patient's chart and labs.  Questions were answered to the patient's satisfaction.     Fabienne Bruns

## 2018-03-23 NOTE — Clinical Social Work Note (Addendum)
[facility] contacted and message left for admissions director Leanora Cover regarding patient. This facility had not responded in the HUB, however CSW advised on an earlier call (4/19) to [facility] that patient's information would be reviewed.  Ms. Renn has been set-up at a dialysis center: [facility], . Her first treatment is scheduled for 03/24/18 at 11:30 am. Her tentative dialysis schedule is TTS at 12:15 pm. For the first treatment, patient needs to arrive 30 minutes early for required paperwork. Patient was provided with a copy of her dialysis schedule.  CSW will continue to follow and work on rehab placement for patient.  Genelle Bal, MSW, LCSW Licensed Clinical Social Worker Clinical Social Work Department [employer] [phone]

## 2018-03-23 NOTE — Progress Notes (Addendum)
ANTICOAGULATION CONSULT NOTE - Follow Up Consult  Pharmacy Consult for heparin/ warfarin Indication: atrial fibrillation  Allergies  Allergen Reactions  . Tape Other (See Comments)    Tears skin - please use paper tape  . Levaquin [Levofloxacin In D5w] Other (See Comments)    Per patient this caused insomnia     Patient Measurements: Height: 5' (152.4 cm) Weight: 134 lb 7.7 oz (61 kg) IBW/kg (Calculated) : 45.5   Vital Signs: Temp: 98.1 F (36.7 C) (04/24 1127) Temp Source: Oral (04/24 1127) BP: 121/54 (04/24 1127) Pulse Rate: 73 (04/24 1127)  Labs: Recent Labs    03/21/18 0710 03/22/18 0325 03/23/18 0427  HGB 9.4* 8.3* 8.0*  HCT 29.2* 26.2* 24.8*  PLT 273 223 264  LABPROT 27.8* 29.8* 19.1*  INR 2.62 2.87 1.62  CREATININE 3.68* 3.41* 3.90*    Estimated Creatinine Clearance: 9.1 mL/min (A) (by C-G formula based on SCr of 3.9 mg/dL (H)).  Assessment: 37 YOF transferred from [place] to initiate dialysis. Recently started warfarin for new afib, but held since 4/21 for shuntogram. Patient with previously therapeutic INR on warfarin. CBC stable. Pharmacy consulted to resume anticoagulation tomorrow 4/25. Heparin will be resumed in the morning. NO BOLUS.  Goal of Therapy:  Heparin level: 0.3-0.7 INR 2-3 Monitor platelets by anticoagulation protocol: Yes   Plan:  Heparin 900 units/hr to start tomorrow (4/25) @ 0700 Warfarin 2.5 mg PO tomorrow (4/25) Daily INR/ CBC/ heparin level Monitor s/sx of bleeding  Ruben Im, PharmD Clinical Pharmacist 03/23/2018 2:48 PM

## 2018-03-23 NOTE — Procedures (Signed)
Patient was seen on dialysis and the procedure was supervised.  BFR low and difficult AVG cannulation. Vascular eval ongoing.   Via LUE AVG BP is  117/58.   Patient appears to be tolerating treatment well  Rachel Johns 03/23/2018

## 2018-03-23 NOTE — Progress Notes (Signed)
PROGRESS NOTE    Rachel Johns  GUY:[id number] DOB: Sep 02, 1934 DOA: 03/09/2018 PCP: Casper Harrison, Stephanie Coup, MD     Brief Narrative:  Rachel Johns is 84 year old woman PMH CKD stage IV transferred from [facility] for worsening renal failure, other issues included atrial fibrillation with rapid ventricular response.  She was seen by nephrology and hemodialysis was initiated.  Also seen by cardiology with adjustments made to medication.   Assessment & Plan:   Principal Problem:   ESRD (end stage renal disease) (HCC) Active Problems:   Rheumatoid arthritis (HCC)   Normocytic anemia   Leukocytosis   Hyponatremia   AF (paroxysmal atrial fibrillation) (HCC)   Acute combined systolic and diastolic CHF, NYHA class 3 (HCC)   Protein-calorie malnutrition, severe   Pressure ulcer   ESRD TTS  --Hemodialysis per nephrology. CLIP completed, accepted at [facility] TTS  --AV graft with increased arterial pressure. Vascular surgery planning for shuntogram when INR < 2, ?today vs tomorrow   Acute on chronic systolic and diastolic congestive of heart failure --LVEF 40-45%.  Severe tricuspid regurgitation by report. --Lower extremity edema stable  Atrial fibrillation, new diagnosis --Episodes of RVR during this admission. --Continue beta-blocker, warfarin-hold  --PFO? Follow-up as an outpatient.  Persistent leukocytosis --No evidence of infection.  No evidence of blood dyscrasia.  Etiology is unclear, this is been persistent.  There is no evidence of infection.  She is on low-dose methylprednisolone --Monitor CBC   Hypothyroidism --Continue levothyroxine  Rheumatoid arthritis --Steroid dependent. Continue methylprednisolone  Anemia of chronic kidney disease. --Hemoglobin has been stable  Severe malnutrition --Per dietitian   DVT prophylaxis: Coumadin (currently on hold for procedure)  Code Status: Full Family Communication: No family at bedside Disposition  Plan: Shuntogram planned, discharge to SNF when ready    Consultants:   Nephrology  Cardiology  Vascular surgery   Antimicrobials:  Anti-infectives (From admission, onward)   Start     Dose/Rate Route Frequency Ordered Stop   03/10/18 0900  ceFEPIme (MAXIPIME) 1 g in sodium chloride 0.9 % 100 mL IVPB  Status:  Discontinued     1 g 200 mL/hr over 30 Minutes Intravenous Every 24 hours 03/10/18 0712 03/12/18 1017       Subjective: States she feels terrible. Wants to eat.   Objective: Vitals:   03/23/18 0930 03/23/18 1000 03/23/18 1030 03/23/18 1044  BP: 119/66 (!) 103/50 (!) 110/50 (!) 117/58  Pulse: 72 70 72 72  Resp:    18  Temp:    98 F (36.7 C)  TempSrc:    Oral  SpO2:    96%  Weight:    61 kg (134 lb 7.7 oz)  Height:        Intake/Output Summary (Last 24 hours) at 03/23/2018 1119 Last data filed at 03/23/2018 1044 Gross per 24 hour  Intake 480 ml  Output 700 ml  Net -220 ml   Filed Weights   03/22/18 2040 03/23/18 0714 03/23/18 1044  Weight: 61 kg (134 lb 7.7 oz) 61.5 kg (135 lb 9.3 oz) 61 kg (134 lb 7.7 oz)    Examination: General exam: Appears calm and comfortable  Respiratory system: Clear to auscultation. Respiratory effort normal. Cardiovascular system: S1 & S2 heard, RRR. No JVD, murmurs, rubs, gallops or clicks. +pitting pedal edema. Gastrointestinal system: Abdomen is nondistended, soft and nontender. No organomegaly or masses felt. Normal bowel sounds heard. Central nervous system: Alert and oriented. No focal neurological deficits. Extremities: Symmetric 5 x 5 power.  Skin: No rashes, lesions or ulcers Psychiatry: Judgement and insight appear normal. Mood & affect appropriate.     Data Reviewed: I have personally reviewed following labs and imaging studies  CBC: Recent Labs  Lab 03/18/18 0500 03/20/18 0346 03/21/18 0710 03/22/18 0325 03/23/18 0427  WBC 28.9* 18.3* 27.8* 25.4* 28.7*  HGB 9.3* 8.2* 9.4* 8.3* 8.0*  HCT 29.6* 25.7* 29.2*  26.2* 24.8*  MCV 86.3 86.8 84.9 86.2 85.2  PLT 292 275 273 223 264   Basic Metabolic Panel: Recent Labs  Lab 03/18/18 0652 03/21/18 0710 03/22/18 0325 03/23/18 0427  NA 130* 124* 130* 125*  K 3.7 3.4* 3.4* 4.2  CL 96* 89* 96* 91*  CO2 18* 17* 19* 16*  GLUCOSE 104* 84 96 83  BUN 84* 87* 83* 99*  CREATININE 3.32* 3.68* 3.41* 3.90*  CALCIUM 7.9* 8.0* 7.9* 8.1*  PHOS 7.0*  --   --   --    GFR: Estimated Creatinine Clearance: 9.1 mL/min (A) (by C-G formula based on SCr of 3.9 mg/dL (H)). Liver Function Tests: Recent Labs  Lab 03/18/18 0652  ALT 22  ALBUMIN 2.0*   No results for input(s): LIPASE, AMYLASE in the last 168 hours. No results for input(s): AMMONIA in the last 168 hours. Coagulation Profile: Recent Labs  Lab 03/19/18 0537 03/20/18 0346 03/21/18 0710 03/22/18 0325 03/23/18 0427  INR 3.64 2.43 2.62 2.87 1.62   Cardiac Enzymes: No results for input(s): CKTOTAL, CKMB, CKMBINDEX, TROPONINI in the last 168 hours. BNP (last 3 results) No results for input(s): PROBNP in the last 8760 hours. HbA1C: No results for input(s): HGBA1C in the last 72 hours. CBG: No results for input(s): GLUCAP in the last 168 hours. Lipid Profile: No results for input(s): CHOL, HDL, LDLCALC, TRIG, CHOLHDL, LDLDIRECT in the last 72 hours. Thyroid Function Tests: No results for input(s): TSH, T4TOTAL, FREET4, T3FREE, THYROIDAB in the last 72 hours. Anemia Panel: No results for input(s): VITAMINB12, FOLATE, FERRITIN, TIBC, IRON, RETICCTPCT in the last 72 hours. Sepsis Labs: No results for input(s): PROCALCITON, LATICACIDVEN in the last 168 hours.  No results found for this or any previous visit (from the past 240 hour(s)).     Radiology Studies: No results found.    Scheduled Meds: . acidophilus  1 capsule Oral Daily  . calcitRIOL  0.25 mcg Oral Q M,W,F  . darbepoetin (ARANESP) injection - DIALYSIS  100 mcg Intravenous Q Wed-HD  . famotidine  20 mg Oral Daily  . feeding  supplement  1 Container Oral TID BM  . feeding supplement (PRO-STAT SUGAR FREE 64)  30 mL Oral BID  . folic acid  1 mg Oral Daily  . hydrALAZINE  50 mg Oral TID  . lanthanum  500 mg Oral TID WC  . levothyroxine  75 mcg Oral QAC breakfast  . methylPREDNISolone  4 mg Oral Daily  . metoprolol tartrate  100 mg Oral BID  . multivitamin  1 tablet Oral QHS  . polyethylene glycol  17 g Oral BID  . senna  1 tablet Oral QHS  . sodium chloride flush  3 mL Intravenous Q12H   Continuous Infusions: . sodium chloride    . sodium chloride    . sodium chloride       LOS: 14 days    Time spent: 20 minutes   Noralee Stain, DO [facility] [email or web address] Password [facility] 03/23/2018, 11:19 AM

## 2018-03-24 LAB — BASIC METABOLIC PANEL
ANION GAP: 14 (ref 5–15)
BUN: 58 mg/dL — ABNORMAL HIGH (ref 6–20)
CO2: 22 mmol/L (ref 22–32)
Calcium: 8.2 mg/dL — ABNORMAL LOW (ref 8.9–10.3)
Chloride: 97 mmol/L — ABNORMAL LOW (ref 101–111)
Creatinine, Ser: 2.88 mg/dL — ABNORMAL HIGH (ref 0.44–1.00)
GFR calc Af Amer: 16 mL/min — ABNORMAL LOW (ref >60)
GFR, EST NON AFRICAN AMERICAN: 14 mL/min — AB (ref >60)
GLUCOSE: 86 mg/dL (ref 65–99)
POTASSIUM: 3.6 mmol/L (ref 3.5–5.1)
Sodium: 133 mmol/L — ABNORMAL LOW (ref 135–145)

## 2018-03-24 LAB — CBC
HCT: 26.6 % — ABNORMAL LOW (ref 36.0–46.0)
Hemoglobin: 8 g/dL — ABNORMAL LOW (ref 12.0–15.0)
MCH: 26 pg (ref 26.0–34.0)
MCHC: 30.1 g/dL (ref 30.0–36.0)
MCV: 86.4 fL (ref 78.0–100.0)
PLATELETS: 232 10*3/uL (ref 150–400)
RBC: 3.08 MIL/uL — ABNORMAL LOW (ref 3.87–5.11)
RDW: 18.3 % — ABNORMAL HIGH (ref 11.5–15.5)
WBC: 14.2 10*3/uL — AB (ref 4.0–10.5)

## 2018-03-24 LAB — PROTIME-INR
INR: 1.3
Prothrombin Time: 16.1 seconds — ABNORMAL HIGH (ref 11.4–15.2)

## 2018-03-24 LAB — HEPARIN LEVEL (UNFRACTIONATED): Heparin Unfractionated: <0.1 IU/mL — ABNORMAL LOW (ref 0.30–0.70)

## 2018-03-24 MED ORDER — HEPARIN BOLUS VIA INFUSION
1500.0000 [IU] | Freq: Once | INTRAVENOUS | Status: AC
  Administered 2018-03-24: 1500 [IU] via INTRAVENOUS
  Filled 2018-03-24: qty 1500

## 2018-03-24 MED ORDER — HEPARIN (PORCINE) IN NACL 100-0.45 UNIT/ML-% IJ SOLN
1300.0000 [IU]/h | INTRAMUSCULAR | Status: DC
  Administered 2018-03-24: 900 [IU]/h via INTRAVENOUS
  Administered 2018-03-25: 1300 [IU]/h via INTRAVENOUS
  Filled 2018-03-24 (×2): qty 250

## 2018-03-24 NOTE — Progress Notes (Signed)
PROGRESS NOTE    Josephine Wooldridge  HUT:[id number] DOB: Mar 03, 1934 DOA: 03/09/2018 PCP: Casper Harrison, Stephanie Coup, MD     Brief Narrative:  Rachel Johns is 84 year old woman PMH CKD stage IV transferred from [facility] for worsening renal failure, other issues included atrial fibrillation with rapid ventricular response.  She was seen by nephrology and hemodialysis was initiated.  Also seen by cardiology with adjustments made to medication.   Assessment & Plan:   Principal Problem:   ESRD (end stage renal disease) (HCC) Active Problems:   Rheumatoid arthritis (HCC)   Normocytic anemia   Leukocytosis   Hyponatremia   AF (paroxysmal atrial fibrillation) (HCC)   Acute combined systolic and diastolic CHF, NYHA class 3 (HCC)   Protein-calorie malnutrition, severe   Pressure ulcer   ESRD TTS  --Hemodialysis per nephrology. CLIP completed, accepted at [facility] TTS  --AV graft with increased arterial pressure. Underwent left arm shuntogram with no significant venous outflow or arterial inflow narrowing.  --Patient will received inpatient HD 4/26 then continue outpatient treatments at [facility]   Acute on chronic systolic and diastolic congestive of heart failure --LVEF 40-45%.  Severe tricuspid regurgitation by report. --Lower extremity edema stable  Atrial fibrillation, new diagnosis --Episodes of RVR during this admission. --Continue beta-blocker, warfarin  --PFO? Follow-up as an outpatient.  Persistent leukocytosis --No evidence of infection.  No evidence of blood dyscrasia.  Etiology is unclear, this is been persistent.  There is no evidence of infection.  She is on low-dose methylprednisolone --Monitor CBC   Hypothyroidism --Continue levothyroxine  Rheumatoid arthritis --Steroid dependent. Continue methylprednisolone  Anemia of chronic kidney disease. --Hemoglobin has been stable  Severe malnutrition --Per dietitian   DVT prophylaxis: Coumadin  resumed   Code Status: Full Family Communication: No family at bedside Disposition Plan: HD 4/26 then discharge to SNF once bed ready    Consultants:   Nephrology  Cardiology  Vascular surgery   Antimicrobials:  Anti-infectives (From admission, onward)   Start     Dose/Rate Route Frequency Ordered Stop   03/10/18 0900  ceFEPIme (MAXIPIME) 1 g in sodium chloride 0.9 % 100 mL IVPB  Status:  Discontinued     1 g 200 mL/hr over 30 Minutes Intravenous Every 24 hours 03/10/18 0712 03/12/18 1017       Subjective: No change. Complains of back pain due to being in bed   Objective: Vitals:   03/23/18 1747 03/23/18 2042 03/24/18 0447 03/24/18 0941  BP: (!) 110/59 (!) 106/56 115/67 (!) 122/57  Pulse: 96 68 70 78  Resp: 18 18 16 18   Temp: 97.7 F (36.5 C) 98.2 F (36.8 C) (!) 97 F (36.1 C) 98.1 F (36.7 C)  TempSrc: Oral Oral    SpO2: 100% 95% 92% 97%  Weight:      Height:        Intake/Output Summary (Last 24 hours) at 03/24/2018 1115 Last data filed at 03/24/2018 1006 Gross per 24 hour  Intake 1080 ml  Output 0 ml  Net 1080 ml   Filed Weights   03/22/18 2040 03/23/18 0714 03/23/18 1044  Weight: 61 kg (134 lb 7.7 oz) 61.5 kg (135 lb 9.3 oz) 61 kg (134 lb 7.7 oz)    Examination: General exam: Appears calm and comfortable  Respiratory system: Clear to auscultation. Respiratory effort normal. Cardiovascular system: S1 & S2 heard, RRR. No JVD, murmurs, rubs, gallops or clicks. +pitting pedal edema. Gastrointestinal system: Abdomen is nondistended, soft and nontender. No organomegaly or  masses felt. Normal bowel sounds heard. Central nervous system: Alert and oriented. No focal neurological deficits. Extremities: Symmetric 5 x 5 power. Skin: No rashes, lesions or ulcers Psychiatry: Judgement and insight appear normal. Mood & affect appropriate.     Data Reviewed: I have personally reviewed following labs and imaging studies  CBC: Recent Labs  Lab 03/20/18 0346  03/21/18 0710 03/22/18 0325 03/23/18 0427 03/24/18 0443  WBC 18.3* 27.8* 25.4* 28.7* 14.2*  HGB 8.2* 9.4* 8.3* 8.0* 8.0*  HCT 25.7* 29.2* 26.2* 24.8* 26.6*  MCV 86.8 84.9 86.2 85.2 86.4  PLT 275 273 223 264 232   Basic Metabolic Panel: Recent Labs  Lab 03/18/18 0652 03/21/18 0710 03/22/18 0325 03/23/18 0427 03/24/18 0443  NA 130* 124* 130* 125* 133*  K 3.7 3.4* 3.4* 4.2 3.6  CL 96* 89* 96* 91* 97*  CO2 18* 17* 19* 16* 22  GLUCOSE 104* 84 96 83 86  BUN 84* 87* 83* 99* 58*  CREATININE 3.32* 3.68* 3.41* 3.90* 2.88*  CALCIUM 7.9* 8.0* 7.9* 8.1* 8.2*  PHOS 7.0*  --   --   --   --    GFR: Estimated Creatinine Clearance: 12.3 mL/min (A) (by C-G formula based on SCr of 2.88 mg/dL (H)). Liver Function Tests: Recent Labs  Lab 03/18/18 0652  ALT 22  ALBUMIN 2.0*   No results for input(s): LIPASE, AMYLASE in the last 168 hours. No results for input(s): AMMONIA in the last 168 hours. Coagulation Profile: Recent Labs  Lab 03/20/18 0346 03/21/18 0710 03/22/18 0325 03/23/18 0427 03/24/18 0443  INR 2.43 2.62 2.87 1.62 1.30   Cardiac Enzymes: No results for input(s): CKTOTAL, CKMB, CKMBINDEX, TROPONINI in the last 168 hours. BNP (last 3 results) No results for input(s): PROBNP in the last 8760 hours. HbA1C: No results for input(s): HGBA1C in the last 72 hours. CBG: No results for input(s): GLUCAP in the last 168 hours. Lipid Profile: No results for input(s): CHOL, HDL, LDLCALC, TRIG, CHOLHDL, LDLDIRECT in the last 72 hours. Thyroid Function Tests: No results for input(s): TSH, T4TOTAL, FREET4, T3FREE, THYROIDAB in the last 72 hours. Anemia Panel: No results for input(s): VITAMINB12, FOLATE, FERRITIN, TIBC, IRON, RETICCTPCT in the last 72 hours. Sepsis Labs: No results for input(s): PROCALCITON, LATICACIDVEN in the last 168 hours.  No results found for this or any previous visit (from the past 240 hour(s)).     Radiology Studies: No results found.    Scheduled  Meds: . acidophilus  1 capsule Oral Daily  . calcitRIOL  0.25 mcg Oral Q M,W,F  . darbepoetin (ARANESP) injection - DIALYSIS  100 mcg Intravenous Q Wed-HD  . famotidine  20 mg Oral Daily  . feeding supplement  1 Container Oral TID BM  . feeding supplement (PRO-STAT SUGAR FREE 64)  30 mL Oral BID  . folic acid  1 mg Oral Daily  . hydrALAZINE  50 mg Oral TID  . lanthanum  500 mg Oral TID WC  . levothyroxine  75 mcg Oral QAC breakfast  . methylPREDNISolone  4 mg Oral Daily  . metoprolol tartrate  100 mg Oral BID  . multivitamin  1 tablet Oral QHS  . polyethylene glycol  17 g Oral BID  . senna  1 tablet Oral QHS  . sodium chloride flush  3 mL Intravenous Q12H  . sodium chloride flush  3 mL Intravenous Q12H  . warfarin  2.5 mg Oral ONCE-1800  . Warfarin - Pharmacist Dosing Inpatient   Does not apply [phone]  Continuous Infusions: . sodium chloride    . sodium chloride    . sodium chloride    . sodium chloride    . heparin 900 Units/hr (03/24/18 0825)     LOS: 15 days    Time spent: 20 minutes   Noralee Stain, DO [facility] [email or web address] Password [facility] 03/24/2018, 11:15 AM

## 2018-03-24 NOTE — Progress Notes (Signed)
ANTICOAGULATION CONSULT NOTE - Follow Up Consult  Pharmacy Consult for heparin/ warfarin Indication: atrial fibrillation  Allergies  Allergen Reactions  . Tape Other (See Comments)    Tears skin - please use paper tape  . Levaquin [Levofloxacin In D5w] Other (See Comments)    Per patient this caused insomnia     Patient Measurements: Height: 5' (152.4 cm) Weight: 134 lb 7.7 oz (61 kg) IBW/kg (Calculated) : 45.5   Vital Signs: Temp: 98.1 F (36.7 C) (04/25 0941) BP: 122/57 (04/25 0941) Pulse Rate: 78 (04/25 0941)  Labs: Recent Labs    03/22/18 0325 03/23/18 0427 03/24/18 0443 03/24/18 1637  HGB 8.3* 8.0* 8.0*  --   HCT 26.2* 24.8* 26.6*  --   PLT 223 264 232  --   LABPROT 29.8* 19.1* 16.1*  --   INR 2.87 1.62 1.30  --   HEPARINUNFRC  --   --   --  <0.10*  CREATININE 3.41* 3.90* 2.88*  --     Estimated Creatinine Clearance: 12.3 mL/min (A) (by C-G formula based on SCr of 2.88 mg/dL (H)).  Assessment: 52 YOF transferred from [place] to initiate dialysis. Recently started warfarin for new afib, but held since 4/21 for shuntogram. Patient with previously therapeutic INR on warfarin. CBC stable. First heparin level subtherapeutic at <0.1  Goal of Therapy:  Heparin level: 0.3-0.7 Monitor platelets by anticoagulation protocol: Yes   Plan:  Bolus 1500 units IV x 1 Increase Heparin to 1100 units/hr HL in 8 hours Daily INR/ CBC/ heparin level Monitor s/sx of bleeding  Gavynn Duvall A. Jeanella Craze, PharmD, BCPS Clinical Pharmacist [place] Pager: [phone]  03/24/2018 5:41 PM

## 2018-03-24 NOTE — Progress Notes (Signed)
[facility] NEPHROLOGY PROGRESS NOTE  Assessment/ Plan: Pt is a 84 y.o. 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 Having difficulty with AV graft with increased arterial pressure.  It was placed on 3/18 by Dr Darrick Penna.  Status post shuntogram on 4/24 with patent ABG.   Patient was accepted at [facility], TTS 2nd shift.  She missed her outpatient treatment today.  The next treatment will be on Tuesday as an outpatient therefore I am planning to do dialysis tomorrow and possibly discharge.  # Acute on chronic systolic and diastolic congestive heart failure: EF of 40 to 45%.  Dialysis with ultrafiltration.  # Anemia: Iron saturation 28%, continue aranesp 100 mcg during dialysis.  Monitor CBC.  # Secondary hyperparathyroidism: PTH 266, at goal for ESRD.  Continue calcitriol and Fosrenol.  # HTN/volume: Blood pressure acceptable.  Continue dialysis.  On hydralazine, metoprolol and monitor blood pressure   Subjective: No new event.  Denied headache, dizziness, chest pain or shortness of breath. Objective Vital signs in last 24 hours: Vitals:   03/23/18 1747 03/23/18 2042 03/24/18 0447 03/24/18 0941  BP: (!) 110/59 (!) 106/56 115/67 (!) 122/57  Pulse: 96 68 70 78  Resp: 18 18 16 18   Temp: 97.7 F (36.5 C) 98.2 F (36.8 C) (!) 97 F (36.1 C) 98.1 F (36.7 C)  TempSrc: Oral Oral    SpO2: 100% 95% 92% 97%  Weight:      Height:       Weight change: 0.5 kg (1 lb 1.6 oz)  Intake/Output Summary (Last 24 hours) at 03/24/2018 1037 Last data filed at 03/24/2018 1006 Gross per 24 hour  Intake 1080 ml  Output 700 ml  Net 380 ml       Labs: Basic Metabolic Panel: Recent Labs  Lab 03/18/18 0652  03/22/18 0325 03/23/18 0427  03/24/18 0443  NA 130*   < > 130* 125* 133*  K 3.7   < > 3.4* 4.2 3.6  CL 96*   < > 96* 91* 97*  CO2 18*   < > 19* 16* 22  GLUCOSE 104*   < > 96 83 86  BUN 84*   < > 83* 99* 58*  CREATININE 3.32*   < > 3.41* 3.90* 2.88*  CALCIUM 7.9*   < > 7.9* 8.1* 8.2*  PHOS 7.0*  --   --   --   --    < > = values in this interval not displayed.   Liver Function Tests: Recent Labs  Lab 03/18/18 0652  ALT 22  ALBUMIN 2.0*   No results for input(s): LIPASE, AMYLASE in the last 168 hours. No results for input(s): AMMONIA in the last 168 hours. CBC: Recent Labs  Lab 03/20/18 0346 03/21/18 0710 03/22/18 0325 03/23/18 0427 03/24/18 0443  WBC 18.3* 27.8* 25.4* 28.7* 14.2*  HGB 8.2* 9.4* 8.3* 8.0* 8.0*  HCT 25.7* 29.2* 26.2* 24.8* 26.6*  MCV 86.8 84.9 86.2 85.2 86.4  PLT 275 273 223 264 232   Cardiac Enzymes: No results for input(s): CKTOTAL, CKMB, CKMBINDEX, TROPONINI in the last 168 hours. CBG: No results for input(s): GLUCAP in the last 168 hours.  Iron Studies: No results for input(s): IRON, TIBC, TRANSFERRIN, FERRITIN in the last 72 hours. Studies/Results: No results found.  Medications:  Infusions: . sodium chloride    . sodium chloride    . sodium chloride    . sodium chloride    . heparin 900 Units/hr (03/24/18 0825)    Scheduled Medications: . acidophilus  1 capsule Oral Daily  . calcitRIOL  0.25 mcg Oral Q M,W,F  . darbepoetin (ARANESP) injection - DIALYSIS  100 mcg Intravenous Q Wed-HD  . famotidine  20 mg Oral Daily  . feeding supplement  1 Container Oral TID BM  . feeding supplement (PRO-STAT SUGAR FREE 64)  30 mL Oral BID  . folic acid  1 mg Oral Daily  . hydrALAZINE  50 mg Oral TID  . lanthanum  500 mg Oral TID WC  . levothyroxine  75 mcg Oral QAC breakfast  . methylPREDNISolone  4 mg Oral Daily  . metoprolol tartrate  100 mg Oral BID  . multivitamin  1 tablet Oral QHS  . polyethylene glycol  17 g Oral BID  . senna  1 tablet Oral QHS  . sodium chloride  flush  3 mL Intravenous Q12H  . sodium chloride flush  3 mL Intravenous Q12H  . warfarin  2.5 mg Oral ONCE-1800  . Warfarin - Pharmacist Dosing Inpatient   Does not apply q1800    have reviewed scheduled and prn medications.  Physical Exam: General: Comfortable, lying in bed, Lungs: Fair bilateral, no wheezing Heart: Regular rate rhythm S1-S2 normal. Abdomen: Soft, nontender Extremities: No extremity edema Dialysis Access: Left upper extremity AV graft with thrill and bruit  Soren Lazarz Prasad Ari Bernabei 03/24/2018,10:37 AM  LOS: 15 days

## 2018-03-24 NOTE — Progress Notes (Signed)
Occupational Therapy Treatment Patient Details Name: Rachel Johns MRN: [id number] DOB: 08-08-1934 Today's Date: 03/24/2018    History of present illness Pt is an 84 y.o. female admitted 03/09/18 secondary to SOB; found to be in a-fib with RVR. Worked up for AKI on superimposed on CKD; HD initiated 4/12 via LUE AVG. Also with decompensated systolic HF. PMH includes CHF, HTN, CKD.   OT comments  Pt able to complete dressing, toileting and 3 activities at sink this visit with set up to min assist, indicating improvement in endurance. Pt is eager to get to rehab and return home. Recommended frequent pressure relief as pt is c/o soreness in her buttocks.  Follow Up Recommendations  SNF;Supervision/Assistance - 24 hour    Equipment Recommendations       Recommendations for Other Services      Precautions / Restrictions Precautions Precautions: Fall       Mobility Bed Mobility               General bed mobility comments: pt in chair  Transfers Overall transfer level: Needs assistance Equipment used: Rolling walker (2 wheeled) Transfers: Sit to/from Stand Sit to Stand: Min guard;Min assist         General transfer comment: min from recliner, min guard from 3 in 1, assist to control descent and for hand placement with walker    Balance             Standing balance-Leahy Scale: Fair Standing balance comment: able to stand statically at sink without support                           ADL either performed or assessed with clinical judgement   ADL Overall ADL's : Needs assistance/impaired     Grooming: Wash/dry hands;Oral care;Brushing hair;Standing;Min guard           Upper Body Dressing : Set up;Sitting Upper Body Dressing Details (indicate cue type and reason): front opening gown Lower Body Dressing: Minimal assistance;Sit to/from stand   Toilet Transfer: Min guard;RW;Ambulation;Comfort height toilet;Cueing for Teaching laboratory technician and Hygiene: Sit to/from stand;Min guard       Functional mobility during ADLs: Min guard;Rolling walker;Cueing for safety General ADL Comments: placed pillow under buttocks for comfort     Vision       Perception     Praxis      Cognition Arousal/Alertness: Awake/alert Behavior During Therapy: WFL for tasks assessed/performed Overall Cognitive Status: Within Functional Limits for tasks assessed                                 General Comments: pt is [facility]        Exercises     Shoulder Instructions       General Comments      Pertinent Vitals/ Pain       Pain Assessment: Faces Faces Pain Scale: Hurts even more Pain Location: buttocks Pain Descriptors / Indicators: Grimacing;Guarding;Sore Pain Intervention(s): Repositioned;Monitored during session  Home Living                                          Prior Functioning/Environment              Frequency  Min 2X/week  Progress Toward Goals  OT Goals(current goals can now be found in the care plan section)     Acute Rehab OT Goals Patient Stated Goal: Get stronger Time For Goal Achievement: 03/25/18 Potential to Achieve Goals: Good  Plan Discharge plan remains appropriate    Co-evaluation                 AM-PAC PT "6 Clicks" Daily Activity     Outcome Measure   Help from another person eating meals?: None Help from another person taking care of personal grooming?: A Little Help from another person toileting, which includes using toliet, bedpan, or urinal?: A Little Help from another person bathing (including washing, rinsing, drying)?: A Little Help from another person to put on and taking off regular upper body clothing?: None Help from another person to put on and taking off regular lower body clothing?: A Little 6 Click Score: 20    End of Session Equipment Utilized During Treatment: Gait belt;Rolling walker  OT Visit Diagnosis:  Unsteadiness on feet (R26.81);Muscle weakness (generalized) (M62.81);Pain   Activity Tolerance Patient tolerated treatment well   Patient Left in chair;with call bell/phone within reach;with chair alarm set   Nurse Communication          Time: 4982-6415 OT Time Calculation (min): 20 min  Charges: OT General Charges $OT Visit: 1 Visit OT Treatments $Self Care/Home Management : 8-22 mins  03/24/2018 Martie Round, OTR/L Pager: [phone]   Rachel Johns Rachel Johns 03/24/2018, 12:40 PM

## 2018-03-24 NOTE — Progress Notes (Signed)
Physical Therapy Treatment Patient Details Name: Rachel Johns MRN: [id number] DOB: 1934-02-18 Today's Date: 03/24/2018    History of Present Illness Pt is an 84 y.o. female admitted 03/09/18 secondary to SOB; found to be in a-fib with RVR. Worked up for AKI on superimposed on CKD; HD initiated 4/12 via LUE AVG. Also with decompensated systolic HF. PMH includes CHF, HTN, CKD.Pt underwent AV graft procedure on 4/24.     PT Comments    Pt continues to demonstrate gross weakness, unable to come to sitting from supine without physical assistance. Transfers are performed with greater frequency and independence this date, but from elevated surface. AMB distance is improving 2x76ft with RW, but dizziness worsens briefly 3 times in session (supine to sit, and 2 AMB trials), but BP is normostatic. Pt progressing well. Will continue to follow acutely.    Follow Up Recommendations  SNF;Supervision/Assistance - 24 hour     Equipment Recommendations  None recommended by PT    Recommendations for Other Services       Precautions / Restrictions Precautions Precautions: Fall    Mobility  Bed Mobility Overal bed mobility: Needs Assistance Bed Mobility: Sit to Supine;Supine to Sit     Supine to sit: Mod assist(trunk weakness, also noted during weak sounding cough) Sit to supine: Min assist   General bed mobility comments: pt in chair  Transfers Overall transfer level: Modified independent Equipment used: Rolling walker (2 wheeled) Transfers: Sit to/from Stand Sit to Stand: Supervision;Min guard         General transfer comment: education on safe RW use, elevated surface to ease effort, 4 attempts required the first time; Xfers 4x total in session  Ambulation/Gait Ambulation/Gait assistance: Min guard Ambulation Distance (Feet): 50 Feet(2x23feet with 3 minutes rest between ) Assistive device: Rolling walker (2 wheeled)   Gait velocity: 0.24m/s  Gait velocity interpretation: 1.31 -  2.62 ft/sec, indicative of limited community ambulator General Gait Details: continues to demonstrate lightheadedness, but ortho vitals checked, normostatic   Stairs             Wheelchair Mobility    Modified Rankin (Stroke Patients Only)       Balance Overall balance assessment: Modified Independent;History of Falls;Mild deficits observed, not formally tested           Standing balance-Leahy Scale: Fair Standing balance comment: able to stand statically at sink without support                            Cognition Arousal/Alertness: Awake/alert Behavior During Therapy: WFL for tasks assessed/performed Overall Cognitive Status: Within Functional Limits for tasks assessed                                 General Comments: pt is [facility]      Exercises      General Comments        Pertinent Vitals/Pain Pain Assessment: Faces Faces Pain Scale: Hurts a little bit Pain Location: buttocks, sitting and bed time Pain Descriptors / Indicators: Grimacing;Guarding;Sore Pain Intervention(s): Limited activity within patient's tolerance;Monitored during session;Repositioned    Home Living                      Prior Function            PT Goals (current goals can now be found in the care plan section) Acute  Rehab PT Goals Patient Stated Goal: Get stronger PT Goal Formulation: With patient Time For Goal Achievement: 04/07/18 Potential to Achieve Goals: Good Progress towards PT goals: Progressing toward goals    Frequency    Min 2X/week      PT Plan Current plan remains appropriate    Co-evaluation              AM-PAC PT "6 Clicks" Daily Activity  Outcome Measure  Difficulty turning over in bed (including adjusting bedclothes, sheets and blankets)?: A Little Difficulty moving from lying on back to sitting on the side of the bed? : Unable Difficulty sitting down on and standing up from a chair with arms (e.g.,  wheelchair, bedside commode, etc,.)?: Unable Help needed moving to and from a bed to chair (including a wheelchair)?: A Little Help needed walking in hospital room?: A Little Help needed climbing 3-5 steps with a railing? : Total 6 Click Score: 12    End of Session Equipment Utilized During Treatment: Gait belt Activity Tolerance: Patient limited by fatigue;Patient tolerated treatment well Patient left: in bed;with call bell/phone within reach;with bed alarm set Nurse Communication: Mobility status PT Visit Diagnosis: Other abnormalities of gait and mobility (R26.89);Muscle weakness (generalized) (M62.81)     Time: 7619-5093 PT Time Calculation (min) (ACUTE ONLY): 24 min  Charges:  $Therapeutic Activity: 23-37 mins                    G Codes:       2:28 PM, 05-Apr-2018 Rosamaria Lints, PT, DPT Physical Therapist - [place] [phone] (Pager)  [phone] (Office)       Dollene Mallery C 2018-04-05, 2:26 PM

## 2018-03-24 NOTE — Clinical Social Work Note (Addendum)
CSW left message for clinical liaison for [facility].  Charlynn Court, CSW [phone]  2:08 pm [facility] does not have any rehab beds. CSW called patient's son. [facility] is the only [facility] that has offered a bed and he is agreeable to this facility. The SNF can only transport patients to HD Monday-Friday and patient is on the TTS schedule. Patient's son stated safety and schedule-wise he could not commit to driving her to HD every Saturday. CSW spoke with Velna Hatchet on the HD unit. She will call the HD center to see if it can be changed to MWF. SNF will start insurance authorization.  Charlynn Court, CSW [phone]

## 2018-03-25 DIAGNOSIS — E039 Hypothyroidism, unspecified: Secondary | ICD-10-CM | POA: Diagnosis not present

## 2018-03-25 DIAGNOSIS — I10 Essential (primary) hypertension: Secondary | ICD-10-CM | POA: Diagnosis not present

## 2018-03-25 DIAGNOSIS — N2581 Secondary hyperparathyroidism of renal origin: Secondary | ICD-10-CM | POA: Diagnosis not present

## 2018-03-25 DIAGNOSIS — N184 Chronic kidney disease, stage 4 (severe): Secondary | ICD-10-CM | POA: Diagnosis not present

## 2018-03-25 DIAGNOSIS — I4891 Unspecified atrial fibrillation: Secondary | ICD-10-CM | POA: Diagnosis not present

## 2018-03-25 DIAGNOSIS — I120 Hypertensive chronic kidney disease with stage 5 chronic kidney disease or end stage renal disease: Secondary | ICD-10-CM | POA: Diagnosis not present

## 2018-03-25 DIAGNOSIS — R51 Headache: Secondary | ICD-10-CM | POA: Diagnosis not present

## 2018-03-25 DIAGNOSIS — E43 Unspecified severe protein-calorie malnutrition: Secondary | ICD-10-CM | POA: Diagnosis not present

## 2018-03-25 DIAGNOSIS — I509 Heart failure, unspecified: Secondary | ICD-10-CM | POA: Diagnosis not present

## 2018-03-25 DIAGNOSIS — R0902 Hypoxemia: Secondary | ICD-10-CM | POA: Diagnosis not present

## 2018-03-25 DIAGNOSIS — D72829 Elevated white blood cell count, unspecified: Secondary | ICD-10-CM | POA: Diagnosis not present

## 2018-03-25 DIAGNOSIS — Z79899 Other long term (current) drug therapy: Secondary | ICD-10-CM | POA: Diagnosis not present

## 2018-03-25 DIAGNOSIS — R5381 Other malaise: Secondary | ICD-10-CM | POA: Diagnosis not present

## 2018-03-25 DIAGNOSIS — E162 Hypoglycemia, unspecified: Secondary | ICD-10-CM | POA: Diagnosis not present

## 2018-03-25 DIAGNOSIS — M6281 Muscle weakness (generalized): Secondary | ICD-10-CM | POA: Diagnosis not present

## 2018-03-25 DIAGNOSIS — Z992 Dependence on renal dialysis: Secondary | ICD-10-CM | POA: Diagnosis not present

## 2018-03-25 DIAGNOSIS — R031 Nonspecific low blood-pressure reading: Secondary | ICD-10-CM | POA: Diagnosis not present

## 2018-03-25 DIAGNOSIS — M0560 Rheumatoid arthritis of unspecified site with involvement of other organs and systems: Secondary | ICD-10-CM | POA: Diagnosis not present

## 2018-03-25 DIAGNOSIS — I5031 Acute diastolic (congestive) heart failure: Secondary | ICD-10-CM | POA: Diagnosis not present

## 2018-03-25 DIAGNOSIS — I480 Paroxysmal atrial fibrillation: Secondary | ICD-10-CM | POA: Diagnosis not present

## 2018-03-25 DIAGNOSIS — R680 Hypothermia, not associated with low environmental temperature: Secondary | ICD-10-CM | POA: Diagnosis not present

## 2018-03-25 DIAGNOSIS — L89899 Pressure ulcer of other site, unspecified stage: Secondary | ICD-10-CM | POA: Diagnosis not present

## 2018-03-25 DIAGNOSIS — I110 Hypertensive heart disease with heart failure: Secondary | ICD-10-CM | POA: Diagnosis not present

## 2018-03-25 DIAGNOSIS — D509 Iron deficiency anemia, unspecified: Secondary | ICD-10-CM | POA: Diagnosis not present

## 2018-03-25 DIAGNOSIS — D649 Anemia, unspecified: Secondary | ICD-10-CM | POA: Diagnosis not present

## 2018-03-25 DIAGNOSIS — J90 Pleural effusion, not elsewhere classified: Secondary | ICD-10-CM | POA: Diagnosis not present

## 2018-03-25 DIAGNOSIS — Z9181 History of falling: Secondary | ICD-10-CM | POA: Diagnosis not present

## 2018-03-25 DIAGNOSIS — D631 Anemia in chronic kidney disease: Secondary | ICD-10-CM | POA: Diagnosis not present

## 2018-03-25 DIAGNOSIS — E871 Hypo-osmolality and hyponatremia: Secondary | ICD-10-CM | POA: Diagnosis not present

## 2018-03-25 DIAGNOSIS — R262 Difficulty in walking, not elsewhere classified: Secondary | ICD-10-CM | POA: Diagnosis not present

## 2018-03-25 DIAGNOSIS — I5043 Acute on chronic combined systolic (congestive) and diastolic (congestive) heart failure: Secondary | ICD-10-CM | POA: Diagnosis not present

## 2018-03-25 DIAGNOSIS — R52 Pain, unspecified: Secondary | ICD-10-CM | POA: Diagnosis not present

## 2018-03-25 DIAGNOSIS — I130 Hypertensive heart and chronic kidney disease with heart failure and stage 1 through stage 4 chronic kidney disease, or unspecified chronic kidney disease: Secondary | ICD-10-CM | POA: Diagnosis not present

## 2018-03-25 DIAGNOSIS — N186 End stage renal disease: Secondary | ICD-10-CM | POA: Diagnosis not present

## 2018-03-25 DIAGNOSIS — M069 Rheumatoid arthritis, unspecified: Secondary | ICD-10-CM | POA: Diagnosis not present

## 2018-03-25 DIAGNOSIS — Z23 Encounter for immunization: Secondary | ICD-10-CM | POA: Diagnosis not present

## 2018-03-25 LAB — BASIC METABOLIC PANEL
Anion gap: 16 — ABNORMAL HIGH (ref 5–15)
BUN: 68 mg/dL — ABNORMAL HIGH (ref 6–20)
CHLORIDE: 91 mmol/L — AB (ref 101–111)
CO2: 19 mmol/L — AB (ref 22–32)
Calcium: 7.9 mg/dL — ABNORMAL LOW (ref 8.9–10.3)
Creatinine, Ser: 3.58 mg/dL — ABNORMAL HIGH (ref 0.44–1.00)
GFR calc Af Amer: 13 mL/min — ABNORMAL LOW (ref >60)
GFR calc non Af Amer: 11 mL/min — ABNORMAL LOW (ref >60)
Glucose, Bld: 93 mg/dL (ref 65–99)
POTASSIUM: 3.9 mmol/L (ref 3.5–5.1)
Sodium: 126 mmol/L — ABNORMAL LOW (ref 135–145)

## 2018-03-25 LAB — CBC
HEMATOCRIT: 24.9 % — AB (ref 36.0–46.0)
HEMOGLOBIN: 7.8 g/dL — AB (ref 12.0–15.0)
MCH: 26.7 pg (ref 26.0–34.0)
MCHC: 31.3 g/dL (ref 30.0–36.0)
MCV: 85.3 fL (ref 78.0–100.0)
Platelets: 246 10*3/uL (ref 150–400)
RBC: 2.92 MIL/uL — AB (ref 3.87–5.11)
RDW: 17.9 % — ABNORMAL HIGH (ref 11.5–15.5)
WBC: 13.7 10*3/uL — ABNORMAL HIGH (ref 4.0–10.5)

## 2018-03-25 LAB — HEPARIN LEVEL (UNFRACTIONATED)
Heparin Unfractionated: <0.1 IU/mL — ABNORMAL LOW (ref 0.30–0.70)
Heparin Unfractionated: <0.1 IU/mL — ABNORMAL LOW (ref 0.30–0.70)

## 2018-03-25 LAB — PROTIME-INR
INR: 1.35
Prothrombin Time: 16.5 seconds — ABNORMAL HIGH (ref 11.4–15.2)

## 2018-03-25 MED ORDER — ALTEPLASE 2 MG IJ SOLR: 2.0000 mg | Freq: Once | INTRAMUSCULAR | Status: DC | PRN

## 2018-03-25 MED ORDER — PENTAFLUOROPROP-TETRAFLUOROETH EX AERO: 1.0000 "application " | INHALATION_SPRAY | CUTANEOUS | Status: DC | PRN

## 2018-03-25 MED ORDER — WARFARIN SODIUM 2.5 MG PO TABS: ORAL_TABLET | ORAL | 0 refills | Status: AC

## 2018-03-25 MED ORDER — HYDRALAZINE HCL 50 MG PO TABS
50.0000 mg | ORAL_TABLET | Freq: Three times a day (TID) | ORAL | 0 refills | Status: AC
Start: 2018-03-25 — End: ?

## 2018-03-25 MED ORDER — METOPROLOL TARTRATE 100 MG PO TABS: 100.0000 mg | ORAL_TABLET | Freq: Two times a day (BID) | ORAL | 0 refills | Status: AC

## 2018-03-25 MED ORDER — LIDOCAINE HCL (PF) 1 % IJ SOLN: 5.0000 mL | INTRAMUSCULAR | Status: DC | PRN

## 2018-03-25 MED ORDER — SODIUM CHLORIDE 0.9 % IV SOLN: 100.0000 mL | INTRAVENOUS | Status: DC | PRN

## 2018-03-25 MED ORDER — WARFARIN SODIUM 2.5 MG PO TABS
2.5000 mg | ORAL_TABLET | Freq: Once | ORAL | Status: AC
  Administered 2018-03-25: 2.5 mg via ORAL
  Filled 2018-03-25: qty 1

## 2018-03-25 MED ORDER — CALCITRIOL 0.25 MCG PO CAPS
ORAL_CAPSULE | ORAL | Status: AC
  Filled 2018-03-25: qty 1

## 2018-03-25 MED ORDER — HEPARIN SODIUM (PORCINE) 1000 UNIT/ML DIALYSIS: 1000.0000 [IU] | INTRAMUSCULAR | Status: DC | PRN

## 2018-03-25 MED ORDER — LIDOCAINE-PRILOCAINE 2.5-2.5 % EX CREA: 1.0000 "application " | TOPICAL_CREAM | CUTANEOUS | Status: DC | PRN

## 2018-03-25 NOTE — Clinical Social Work Placement (Signed)
   CLINICAL SOCIAL WORK PLACEMENT  NOTE  Date:  03/25/2018  Patient Details  Name: Rachel Johns MRN: [id number] Date of Birth: 1934-06-29  Clinical Social Work is seeking post-discharge placement for this patient at the Skilled  Nursing Facility level of care (*CSW will initial, date and re-position this form in  chart as items are completed):  No(Son provided with SNF information by phone)   Patient/family provided with [facility] Clinical Social Work Department's list of facilities offering this level of care within the geographic area requested by the patient (or if unable, by the patient's family).  Yes   Patient/family informed of their freedom to choose among providers that offer the needed level of care, that participate in Medicare, Medicaid or managed care program needed by the patient, have an available bed and are willing to accept the patient.  No   Patient/family informed of [facility]'s ownership interest in [facility] and [facility], as well as of the fact that they are under no obligation to receive care at these facilities.  PASRR submitted to EDS on       PASRR number received on       Existing PASRR number confirmed on 03/16/18     FL2 transmitted to all facilities in geographic area requested by pt/family on 03/16/18     FL2 transmitted to all facilities within larger geographic area on       Patient informed that his/her managed care company has contracts with or will negotiate with certain facilities, including the following:        Yes   Patient/family informed of bed offers received.  Patient chooses bed at [facility]     Physician recommends and patient chooses bed at      Patient to be transferred to [facility] on 03/25/18.  Patient to be transferred to facility by [facility]     Patient family notified on 03/25/18 of transfer.  Name of family member notified:  Waynesha Rammel     PHYSICIAN Please prepare  prescriptions     Additional Comment:    _______________________________________________ Margarito Liner, LCSW 03/25/2018, 2:01 PM

## 2018-03-25 NOTE — Progress Notes (Signed)
[facility] NEPHROLOGY PROGRESS NOTE  Assessment/ Plan: Pt is a 84 y.o. 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  It was placed on 3/18 by Dr Darrick Penna.  Status post shuntogram on 4/24 with patent ABG.   Patient was accepted at [facility] MWF. HD today and next treatment will be on Monday. Good blood flow.  # Acute on chronic systolic and diastolic congestive heart failure: EF of 40 to 45%.  Dialysis with ultrafiltration.  # Anemia: Iron saturation 28%, continue aranesp 100 mcg during dialysis.  Monitor CBC.  # Secondary hyperparathyroidism: PTH 266, at goal for ESRD.  Continue calcitriol and Fosrenol.  # HTN/volume: Blood pressure acceptable.  Continue dialysis.  On hydralazine, metoprolol. monitor blood pressure  # Afib with RVR: on coumadin and BB.   Subjective: No new event.  Denies headache, dizziness, nausea vomiting chest pain shortness of breath.  Objective Vital signs in last 24 hours: Vitals:   03/24/18 2130 03/25/18 0555 03/25/18 1015 03/25/18 1522  BP: 124/66 (!) 107/59 (!) 105/52 (!) 144/67  Pulse: 68 63 63 69  Resp:  18 18 18   Temp: 97.8 F (36.6 C) 98.1 F (36.7 C) (!) 97.5 F (36.4 C) (!) 97.5 F (36.4 C)  TempSrc: Oral Oral Oral Oral  SpO2: 95% 91% 90% 94%  Weight: 60.3 kg (132 lb 15 oz)     Height:       Weight change: -1.2 kg (-2 lb 10.3 oz)  Intake/Output Summary (Last 24 hours) at 03/25/2018 1617 Last data filed at 03/25/2018 1300 Gross per 24 hour  Intake 883.48 ml  Output 1 ml  Net 882.48 ml       Labs: Basic Metabolic Panel: Recent Labs  Lab 03/23/18 0427 03/24/18 0443 03/25/18 0223  NA 125* 133* 126*  K 4.2 3.6 3.9  CL 91* 97* 91*  CO2 16* 22 19*  GLUCOSE 83 86 93  BUN 99* 58* 68*   CREATININE 3.90* 2.88* 3.58*  CALCIUM 8.1* 8.2* 7.9*   Liver Function Tests: No results for input(s): AST, ALT, ALKPHOS, BILITOT, PROT, ALBUMIN in the last 168 hours. No results for input(s): LIPASE, AMYLASE in the last 168 hours. No results for input(s): AMMONIA in the last 168 hours. CBC: Recent Labs  Lab 03/21/18 0710 03/22/18 0325 03/23/18 0427 03/24/18 0443 03/25/18 0223  WBC 27.8* 25.4* 28.7* 14.2* 13.7*  HGB 9.4* 8.3* 8.0* 8.0* 7.8*  HCT 29.2* 26.2* 24.8* 26.6* 24.9*  MCV 84.9 86.2 85.2 86.4 85.3  PLT 273 223 264 232 246   Cardiac Enzymes: No results for input(s): CKTOTAL, CKMB, CKMBINDEX, TROPONINI in the last 168 hours. CBG: No results for input(s): GLUCAP in the last 168 hours.  Iron Studies: No results for input(s): IRON, TIBC, TRANSFERRIN, FERRITIN in the last 72 hours. Studies/Results: No results found.  Medications: Infusions: . sodium chloride    . sodium chloride    . sodium chloride    . sodium chloride    . heparin 1,300 Units/hr (03/25/18 1027)    Scheduled Medications: . acidophilus  1 capsule Oral Daily  . calcitRIOL  0.25 mcg Oral Q M,W,F  . darbepoetin (ARANESP) injection - DIALYSIS  100 mcg Intravenous Q Wed-HD  . famotidine  20 mg Oral Daily  . feeding supplement  1 Container  Oral TID BM  . feeding supplement (PRO-STAT SUGAR FREE 64)  30 mL Oral BID  . folic acid  1 mg Oral Daily  . hydrALAZINE  50 mg Oral TID  . lanthanum  500 mg Oral TID WC  . levothyroxine  75 mcg Oral QAC breakfast  . methylPREDNISolone  4 mg Oral Daily  . metoprolol tartrate  100 mg Oral BID  . multivitamin  1 tablet Oral QHS  . polyethylene glycol  17 g Oral BID  . senna  1 tablet Oral QHS  . sodium chloride flush  3 mL Intravenous Q12H  . sodium chloride flush  3 mL Intravenous Q12H  . warfarin  2.5 mg Oral ONCE-1800  . Warfarin - Pharmacist Dosing Inpatient   Does not apply q1800    have reviewed scheduled and prn medications.  Physical Exam: General:  Not in distress Lungs: Clear bilateral, no wheezing Heart: Regular rate rhythm S1-S2 normal Abdomen: Soft, nontender Extremities: No extremity edema Dialysis Access: Left upper extremity AV graft with thrill and bruit  Rachel Johns 03/25/2018,4:17 PM  LOS: 16 days

## 2018-03-25 NOTE — Procedures (Signed)
Patient was seen on dialysis and the procedure was supervised.  BFR 400  Via AVG BP is 144/67 .   Patient appears to be tolerating treatment well  Kalani Baray Jaynie Collins 03/25/2018

## 2018-03-25 NOTE — Progress Notes (Signed)
HD tx completed w/o problem, UF goal met, blood rinsed back, VSS, report called to Sima Matas, RN

## 2018-03-25 NOTE — Progress Notes (Signed)
Called report to [facility] and rehab RN. Transport called patient will be discharge soon.

## 2018-03-25 NOTE — Progress Notes (Signed)
Occupational Therapy Treatment Patient Details Name: Rachel Johns MRN: [id number] DOB: [birth year] Today's Date: 03/25/2018    History of present illness Pt is an 84 y.o. female admitted 03/09/18 secondary to SOB; found to be in a-fib with RVR. Worked up for AKI on superimposed on CKD; HD initiated 4/12 via LUE AVG. Also with decompensated systolic HF. PMH includes CHF, HTN, CKD.Pt underwent AV graft procedure on 4/24.    OT comments  Pt continues to be generally weak, requiring assistance today to raise trunk when sitting up and being unable to stand at sink for grooming, so performed seated in chair. Increased time and min guard assist to stand x 2. Pt stating she did not know how long she could remain up in chair. Ordered a different meal tray for pt and encouraged her to remain up until after she finished eating. Continues to be appropriate for SNF level rehab. Will continue to follow.  Follow Up Recommendations  SNF;Supervision/Assistance - 24 hour    Equipment Recommendations       Recommendations for Other Services      Precautions / Restrictions Precautions Precautions: Fall       Mobility Bed Mobility Overal bed mobility: Needs Assistance Bed Mobility: Rolling;Sidelying to Sit Rolling: Modified independent (Device/Increase time) Sidelying to sit: Min assist       General bed mobility comments: used rail to roll, assist to raise trunk  Transfers Overall transfer level: Needs assistance Equipment used: Rolling walker (2 wheeled) Transfers: Sit to/from [employer] Sit to Stand: Min guard Stand pivot transfers: Min guard       General transfer comment: from bed and BSC    Balance     Sitting balance-Leahy Scale: Fair Sitting balance - Comments: pt holding her head and reporting feeling weak     Standing balance-Leahy Scale: Poor Standing balance comment: reliant on B UEs on walker                           ADL either performed  or assessed with clinical judgement   ADL Overall ADL's : Needs assistance/impaired     Grooming: Wash/dry hands;Wash/dry face;Brushing hair;Sitting;Set up           Upper Body Dressing : Set up;Sitting Upper Body Dressing Details (indicate cue type and reason): front opening gown     Toilet Transfer: Min guard;Stand-pivot;BSC;RW   Toileting- Clothing Manipulation and Hygiene: Minimal assistance;Sit to/from stand               Vision       Perception     Praxis      Cognition Arousal/Alertness: Awake/alert Behavior During Therapy: WFL for tasks assessed/performed Overall Cognitive Status: Within Functional Limits for tasks assessed                                          Exercises     Shoulder Instructions       General Comments      Pertinent Vitals/ Pain       Pain Assessment: Faces Faces Pain Scale: Hurts a little bit Pain Location: R UE Pain Descriptors / Indicators: Discomfort Pain Intervention(s): Other (comment);Repositioned(RN notified of edema)  Home Living  Prior Functioning/Environment              Frequency  Min 2X/week        Progress Toward Goals  OT Goals(current goals can now be found in the care plan section)  Progress towards OT goals: Progressing toward goals  Acute Rehab OT Goals Patient Stated Goal: Get stronger Time For Goal Achievement: 04/01/18 Potential to Achieve Goals: Good  Plan Discharge plan remains appropriate    Co-evaluation                 AM-PAC PT "6 Clicks" Daily Activity     Outcome Measure   Help from another person eating meals?: None Help from another person taking care of personal grooming?: A Little Help from another person toileting, which includes using toliet, bedpan, or urinal?: A Little Help from another person bathing (including washing, rinsing, drying)?: A Little Help from another person to put  on and taking off regular upper body clothing?: None Help from another person to put on and taking off regular lower body clothing?: A Little 6 Click Score: 20    End of Session Equipment Utilized During Treatment: Gait belt;Rolling walker  OT Visit Diagnosis: Unsteadiness on feet (R26.81);Muscle weakness (generalized) (M62.81);Pain   Activity Tolerance Patient limited by fatigue   Patient Left in chair;with call bell/phone within reach;with chair alarm set   Nurse Communication Other (comment)(R UE edema, redness, tight arm band)        Time: 0840-0900 OT Time Calculation (min): 20 min  Charges: OT General Charges $OT Visit: 1 Visit OT Treatments $Self Care/Home Management : 8-22 mins  03/25/2018 Martie Round, OTR/L Pager: [phone]  Iran Planas, [place] Bailiff 03/25/2018, 9:05 AM

## 2018-03-25 NOTE — Progress Notes (Signed)
ANTICOAGULATION CONSULT NOTE - Follow Up Consult  Pharmacy Consult for heparin >> warfarin Indication: atrial fibrillation  Allergies  Allergen Reactions  . Tape Other (See Comments)    Tears skin - please use paper tape  . Levaquin [Levofloxacin In D5w] Other (See Comments)    Per patient this caused insomnia     Patient Measurements: Height: 5' (152.4 cm) Weight: 132 lb 15 oz (60.3 kg) IBW/kg (Calculated) : 45.5  Vital Signs: Temp: 97.5 F (36.4 C) (04/26 1015) Temp Source: Oral (04/26 1015) BP: 105/52 (04/26 1015) Pulse Rate: 63 (04/26 1015)  Labs: Recent Labs    03/23/18 0427 03/24/18 0443 03/24/18 1637 03/25/18 0223  HGB 8.0* 8.0*  --  7.8*  HCT 24.8* 26.6*  --  24.9*  PLT 264 232  --  246  LABPROT 19.1* 16.1*  --  16.5*  INR 1.62 1.30  --  1.35  HEPARINUNFRC  --   --  <0.10* <0.10*  CREATININE 3.90* 2.88*  --  3.58*    Estimated Creatinine Clearance: 9.8 mL/min (A) (by C-G formula based on SCr of 3.58 mg/dL (H)).   Medications:  Scheduled:  . acidophilus  1 capsule Oral Daily  . calcitRIOL  0.25 mcg Oral Q M,W,F  . darbepoetin (ARANESP) injection - DIALYSIS  100 mcg Intravenous Q Wed-HD  . famotidine  20 mg Oral Daily  . feeding supplement  1 Container Oral TID BM  . feeding supplement (PRO-STAT SUGAR FREE 64)  30 mL Oral BID  . folic acid  1 mg Oral Daily  . hydrALAZINE  50 mg Oral TID  . lanthanum  500 mg Oral TID WC  . levothyroxine  75 mcg Oral QAC breakfast  . methylPREDNISolone  4 mg Oral Daily  . metoprolol tartrate  100 mg Oral BID  . multivitamin  1 tablet Oral QHS  . polyethylene glycol  17 g Oral BID  . senna  1 tablet Oral QHS  . sodium chloride flush  3 mL Intravenous Q12H  . sodium chloride flush  3 mL Intravenous Q12H  . Warfarin - Pharmacist Dosing Inpatient   Does not apply q1800   Infusions:  . sodium chloride    . sodium chloride    . sodium chloride    . sodium chloride    . heparin 1,300 Units/hr (03/25/18 1027)     Assessment: 84 yo F on heparin >> warfarin for afib.  IV line for heparin was infiltrated this AM and heparin was off 0900-1030.  IV was restarted by IV team and heparin resumed.  Spoke with MD, anticipate patient discharge after HD today.  Goal of Therapy:  INR 2-3 Heparin level 0.3-0.7 units/ml Monitor platelets by anticoagulation protocol: Yes   Plan:  Heparin at 1300 units/hr Will order heparin level for 1900 tonight but anticipate patient will be discharged prior to this. Warfarin 2.5mg  PO x 1 tonight. Recommend warfarin 2.5mg  daily except 1.25mg  on Mon/Fri at discharge.    [employer], Pharm.D., BCPS Clinical Pharmacist Pager: [phone] Clinical phone for 03/25/2018 from 8:30-4:00 is [phone]. After 4pm, please call Main Rx [phone]) for assistance. 03/25/2018 1:08 PM

## 2018-03-25 NOTE — Progress Notes (Signed)
ANTICOAGULATION CONSULT NOTE - Follow Up Consult  Pharmacy Consult for Heparin Indication: atrial fibrillation  Allergies  Allergen Reactions  . Tape Other (See Comments)    Tears skin - please use paper tape  . Levaquin [Levofloxacin In D5w] Other (See Comments)    Per patient this caused insomnia     Patient Measurements: Height: 5' (152.4 cm) Weight: 134 lb 7.7 oz (61 kg) IBW/kg (Calculated) : 45.5   Vital Signs: Temp: 97.8 F (36.6 C) (04/25 2130) Temp Source: Oral (04/25 2130) BP: 124/66 (04/25 2130) Pulse Rate: 68 (04/25 2130)  Labs: Recent Labs    03/23/18 0427 03/24/18 0443 03/24/18 1637 03/25/18 0223  HGB 8.0* 8.0*  --  7.8*  HCT 24.8* 26.6*  --  24.9*  PLT 264 232  --  246  LABPROT 19.1* 16.1*  --  16.5*  INR 1.62 1.30  --  1.35  HEPARINUNFRC  --   --  <0.10* <0.10*  CREATININE 3.90* 2.88*  --  3.58*    Estimated Creatinine Clearance: 9.9 mL/min (A) (by C-G formula based on SCr of 3.58 mg/dL (H)).  Assessment: 11 YOF transferred from [place] to initiate dialysis. Recently started warfarin for new afib, but held since 4/21 for shuntogram. Patient with previously therapeutic INR on warfarin. CBC stable. Heparin level sub-therapeutic this AM. No issues per RN.   Goal of Therapy:  Heparin level: 0.3-0.7 Monitor platelets by anticoagulation protocol: Yes   Plan:  Inc heparin to 1300 units/hr Re-check heparin level in 8 hours  Abran Duke, PharmD, BCPS Clinical Pharmacist Phone: [phone]

## 2018-03-25 NOTE — Progress Notes (Signed)
Received pt from Melisa, RN, HD tx initiated @ 1530 w/o problem, per report, pull/push/flush well per report, VSS, will cont to monitor while on HD tx

## 2018-03-25 NOTE — Progress Notes (Signed)
Heparin stopped at this time.IV infiltrated.IV team notified.

## 2018-03-25 NOTE — Discharge Summary (Signed)
Physician Discharge Summary  Jillane Po JQB:[id number] DOB: [birth year] DOA: 03/09/2018  PCP: Bobbye Morton, MD  Admit date: 03/09/2018 Discharge date: 03/25/2018  Admitted From: Transfer from [place] Disposition:  SNF   Recommendations for Outpatient Follow-up:  1. Follow up with PCP in 1 week 2. HD in [place] MWF. First treatment time at 12:10pm and must be present at 11am for paperwork 3. Monitor CBC.  Patient has had leukocytosis and is on steroids for rheumatoid arthritis.  No sign of infection was found. 4. Monitor INR on Coumadin. 2.5mg  on 4/26, then resume 2.5 mg daily, 1.25mg  Monday and Friday.   Discharge Condition: Stable CODE STATUS: Full  Diet recommendation: Renal diet, fluid restriction   Brief/Interim Summary: Rachel Johns is a 84 y.o. female with medical history significant of HTN, CKD stage IV, RA, hypothyroidism, CHF last EF 40-45%, presented with worsening exertional shortness of breath, worsening lower extremity edema along with a 7 lb weight gain to [facility] on 4/3.  She was also found to be in atrial fibrillation with RVR at a rate of 133 bpm, and probably decompensated diastolic heart failure. Started on IV Lasix, IV Cardizem and IV heparin and admitted to the hospitalist service. She spontaneously converted to sinus rhythm. Hospitalization has been complicated by worsening renal function, hyponatremia, and persistent leukocytosis.  Patient ultimately was transferred to [facility] for need of hemodialysis.  Cardiology, nephrology were consulted.  She was maintained on beta-blocker for rate control.  She was started on dialysis during her hospitalization.  She underwent left arm shuntogram on 4/24 due to poorly functioning left arm AV graft.  Shuntogram revealed no significant venous outflow or arterial inflow narrowing.  Patient's AV graft was widely patent and ready for use.  She work with physical therapy and was discharged to skilled  nursing facility set up for outpatient dialysis.  Discharge Diagnoses:  Principal Problem:   ESRD (end stage renal disease) (HCC) Active Problems:   Rheumatoid arthritis (HCC)   Normocytic anemia   Leukocytosis   Hyponatremia   AF (paroxysmal atrial fibrillation) (HCC)   Acute combined systolic and diastolic CHF, NYHA class 3 (HCC)   Protein-calorie malnutrition, severe   Pressure ulcer   ESRD MWF --Hemodialysis per nephrology. CLIP completed, accepted at [facility] MWF  --AV graft with increased arterial pressure. Underwent left arm shuntogram 4/24 with no significant venous outflow or arterial inflow narrowing.    Acute on chronic systolic and diastolic congestive of heart failure --LVEF 40-45%. Severe tricuspid regurgitation by report. --Lower extremity edema stable  Atrial fibrillation, new diagnosis --Episodes of RVR during this admission. --Continuebeta-blocker, warfarin  --PFO? Follow-up as an outpatient.  Persistent leukocytosis --No evidence of infection. No evidence of blood dyscrasia.  Etiology is unclear, this is been persistent. There is no evidence of infection. She is on low-dose methylprednisolone --Monitor CBC   Hypothyroidism --Continuelevothyroxine  Rheumatoid arthritis --Steroid dependent. Continuemethylprednisolone  Anemia of chronic kidney disease. --Hemoglobinhas been stable  Severe malnutrition --Per dietitian   Discharge Instructions  Discharge Instructions    Call MD for:  difficulty breathing, headache or visual disturbances   Complete by:  As directed    Call MD for:  extreme fatigue   Complete by:  As directed    Call MD for:  hives   Complete by:  As directed    Call MD for:  persistant dizziness or light-headedness   Complete by:  As directed    Call MD for:  persistant  nausea and vomiting   Complete by:  As directed    Call MD for:  severe uncontrolled pain   Complete by:  As directed    Call MD for:   temperature >100.4   Complete by:  As directed    Increase activity slowly   Complete by:  As directed      Allergies as of 03/25/2018      Reactions   Tape Other (See Comments)   Tears skin - please use paper tape   Levaquin [levofloxacin In D5w] Other (See Comments)   Per patient this caused insomnia       Medication List    STOP taking these medications   amLODipine 10 MG tablet Commonly known as:  NORVASC   cloNIDine 0.2 MG tablet Commonly known as:  CATAPRES   doxylamine (Sleep) 25 MG tablet Commonly known as:  UNISOM   furosemide 20 MG tablet Commonly known as:  LASIX   sodium bicarbonate 650 MG tablet   traMADol 50 MG tablet Commonly known as:  ULTRAM     TAKE these medications   acetaminophen 325 MG tablet Commonly known as:  TYLENOL Take 325-650 mg by mouth 2 (two) times daily as needed for moderate pain or headache.   allopurinol 100 MG tablet Commonly known as:  ZYLOPRIM Take 100 mg by mouth daily.   calcitRIOL 0.25 MCG capsule Commonly known as:  ROCALTROL Take 0.25 mcg by mouth every Monday, Wednesday, and Friday.   cholecalciferol 1000 units tablet Commonly known as:  VITAMIN D Take 2,000 Units by mouth daily.   CULTURELLE PO Take 1 capsule by mouth daily.   folic acid 1 MG tablet Commonly known as:  FOLVITE Take 1 mg by mouth daily.   hydrALAZINE 50 MG tablet Commonly known as:  APRESOLINE Take 1 tablet (50 mg total) by mouth 3 (three) times daily.   levothyroxine 75 MCG tablet Commonly known as:  SYNTHROID, LEVOTHROID Take 75 mcg by mouth daily before breakfast.   methylPREDNISolone 4 MG tablet Commonly known as:  MEDROL Take 1 tablet (4 mg total) by mouth daily. Continuous course for arthritis pain. HOLD WHILE YOU ARE ON HIGHER DOSE PREDNISONE AND TAPER IS COMPLETED.   metoprolol tartrate 100 MG tablet Commonly known as:  LOPRESSOR Take 1 tablet (100 mg total) by mouth 2 (two) times daily.   PROCRIT IJ Inject 1 each as  directed as needed (for low hemoglobin).   ranitidine 150 MG tablet Commonly known as:  ZANTAC Take 150 mg by mouth 2 (two) times daily.   warfarin 2.5 MG tablet Commonly known as:  COUMADIN 2.5mg  once 4/26, then continue 2.5mg  daily except 1.25mg  on Monday and Friday       Contact information for follow-up providers    Street, Stephanie Coup, MD. Schedule an appointment as soon as possible for a visit in 1 week(s).   Specialty:  Family Medicine Contact information: [address] [place] [address] [phone]            Contact information for after-discharge care    Destination    [facility] .   Service:  Skilled Nursing Contact information: [address] [place] [address] [phone]                 Allergies  Allergen Reactions  . Tape Other (See Comments)    Tears skin - please use paper tape  . Levaquin [Levofloxacin In D5w] Other (See Comments)  Per patient this caused insomnia     Consultations:  Nephrology  Cardiology  Vascular surgery    Procedures/Studies: Dg Chest 2 View  Result Date: 03/11/2018 CLINICAL DATA:  Weakness, shortness of Breath EXAM: CHEST - 2 VIEW COMPARISON:  03/03/2018 FINDINGS: Cardiomegaly. Layering bilateral effusions. Vascular congestion and bilateral perihilar and lower lobe airspace opacities, favor edema/CHF, worsening since prior study. Large hiatal hernia. No acute bony abnormality. IMPRESSION: Cardiomegaly. Worsening perihilar and lower lobe airspace opacities, favor edema. Layering bilateral effusions. Electronically Signed   By: Charlett Nose M.D.   On: 03/11/2018 09:25      Discharge Exam: Vitals:   03/25/18 0555 03/25/18 1015  BP: (!) 107/59 (!) 105/52  Pulse: 63 63  Resp: 18 18  Temp: 98.1 F (36.7 C) (!) 97.5 F (36.4 C)  SpO2: 91% 90%    General: Pt is alert, awake, not in acute distress Cardiovascular: RRR, S1/S2 +, no rubs, no  gallops Respiratory: CTA bilaterally, no wheezing, no rhonchi Abdominal: Soft, NT, ND, bowel sounds + Extremities: +1 pitting edema, no cyanosis    The results of significant diagnostics from this hospitalization (including imaging, microbiology, ancillary and laboratory) are listed below for reference.     Microbiology: No results found for this or any previous visit (from the past 240 hour(s)).   Labs: BNP (last 3 results) No results for input(s): BNP in the last 8760 hours. Basic Metabolic Panel: Recent Labs  Lab 03/21/18 0710 03/22/18 0325 03/23/18 0427 03/24/18 0443 03/25/18 0223  NA 124* 130* 125* 133* 126*  K 3.4* 3.4* 4.2 3.6 3.9  CL 89* 96* 91* 97* 91*  CO2 17* 19* 16* 22 19*  GLUCOSE 84 96 83 86 93  BUN 87* 83* 99* 58* 68*  CREATININE 3.68* 3.41* 3.90* 2.88* 3.58*  CALCIUM 8.0* 7.9* 8.1* 8.2* 7.9*   Liver Function Tests: No results for input(s): AST, ALT, ALKPHOS, BILITOT, PROT, ALBUMIN in the last 168 hours. No results for input(s): LIPASE, AMYLASE in the last 168 hours. No results for input(s): AMMONIA in the last 168 hours. CBC: Recent Labs  Lab 03/21/18 0710 03/22/18 0325 03/23/18 0427 03/24/18 0443 03/25/18 0223  WBC 27.8* 25.4* 28.7* 14.2* 13.7*  HGB 9.4* 8.3* 8.0* 8.0* 7.8*  HCT 29.2* 26.2* 24.8* 26.6* 24.9*  MCV 84.9 86.2 85.2 86.4 85.3  PLT 273 223 264 232 246   Cardiac Enzymes: No results for input(s): CKTOTAL, CKMB, CKMBINDEX, TROPONINI in the last 168 hours. BNP: Invalid input(s): POCBNP CBG: No results for input(s): GLUCAP in the last 168 hours. D-Dimer No results for input(s): DDIMER in the last 72 hours. Hgb A1c No results for input(s): HGBA1C in the last 72 hours. Lipid Profile No results for input(s): CHOL, HDL, LDLCALC, TRIG, CHOLHDL, LDLDIRECT in the last 72 hours. Thyroid function studies No results for input(s): TSH, T4TOTAL, T3FREE, THYROIDAB in the last 72 hours.  Invalid input(s): FREET3 Anemia work up No results  for input(s): VITAMINB12, FOLATE, FERRITIN, TIBC, IRON, RETICCTPCT in the last 72 hours. Urinalysis    Component Value Date/Time   COLORURINE YELLOW 12/04/2016 1839   APPEARANCEUR CLEAR 12/04/2016 1839   LABSPEC 1.011 12/04/2016 1839   PHURINE 5.0 12/04/2016 1839   GLUCOSEU NEGATIVE 12/04/2016 1839   HGBUR NEGATIVE 12/04/2016 1839   BILIRUBINUR NEGATIVE 12/04/2016 1839   KETONESUR NEGATIVE 12/04/2016 1839   PROTEINUR NEGATIVE 12/04/2016 1839   NITRITE NEGATIVE 12/04/2016 1839   LEUKOCYTESUR NEGATIVE 12/04/2016 1839   Sepsis Labs Invalid input(s): PROCALCITONIN,  WBC,  LACTICIDVEN Microbiology No results found for this or any previous visit (from the past 240 hour(s)).   Patient was seen and examined on the day of discharge and was found to be in stable condition. Time coordinating discharge: 40 minutes including assessment and coordination of care, as well as examination of the patient.   SIGNED:  Noralee Stain, DO [facility] Pager [phone]  If 7PM-7AM, please contact night-coverage [email or web address] Password [email or web address] 03/25/2018, 1:55 PM

## 2018-03-25 NOTE — Clinical Social Work Note (Signed)
CSW facilitated patient discharge including contacting patient family and facility to confirm patient discharge plans. Clinical information faxed to facility and family agreeable with plan. RN will arrange ambulance transport via [facility] to [facility] once she returns from HD. RN to call report prior to discharge [phone]).  To set up transport, call [phone]. Option 1, option 3. Transport paperwork will be in the discharge folder.  CSW will sign off for now as social work intervention is no longer needed. Please consult [place] again if new needs arise.  Charlynn Court, CSW [phone]

## 2018-03-25 NOTE — Progress Notes (Signed)
Update 4/26 schedule change to Monday,Wednesday and Friday chair time at 12:10pm needs to be their for 1st treatment at 11:00am to sign paperwork.

## 2018-03-28 DIAGNOSIS — I5043 Acute on chronic combined systolic (congestive) and diastolic (congestive) heart failure: Secondary | ICD-10-CM | POA: Diagnosis not present

## 2018-03-28 DIAGNOSIS — E43 Unspecified severe protein-calorie malnutrition: Secondary | ICD-10-CM | POA: Diagnosis not present

## 2018-03-28 DIAGNOSIS — R52 Pain, unspecified: Secondary | ICD-10-CM | POA: Diagnosis not present

## 2018-03-28 DIAGNOSIS — E039 Hypothyroidism, unspecified: Secondary | ICD-10-CM | POA: Diagnosis not present

## 2018-03-28 DIAGNOSIS — I480 Paroxysmal atrial fibrillation: Secondary | ICD-10-CM | POA: Diagnosis not present

## 2018-03-28 DIAGNOSIS — N186 End stage renal disease: Secondary | ICD-10-CM | POA: Diagnosis not present

## 2018-03-28 DIAGNOSIS — N2581 Secondary hyperparathyroidism of renal origin: Secondary | ICD-10-CM | POA: Diagnosis not present

## 2018-03-29 DIAGNOSIS — I5043 Acute on chronic combined systolic (congestive) and diastolic (congestive) heart failure: Secondary | ICD-10-CM | POA: Diagnosis not present

## 2018-03-29 DIAGNOSIS — I480 Paroxysmal atrial fibrillation: Secondary | ICD-10-CM | POA: Diagnosis not present

## 2018-03-29 DIAGNOSIS — N186 End stage renal disease: Secondary | ICD-10-CM | POA: Diagnosis not present

## 2018-03-29 DIAGNOSIS — D72829 Elevated white blood cell count, unspecified: Secondary | ICD-10-CM | POA: Diagnosis not present

## 2018-03-30 DIAGNOSIS — N186 End stage renal disease: Secondary | ICD-10-CM | POA: Diagnosis not present

## 2018-03-30 DIAGNOSIS — D631 Anemia in chronic kidney disease: Secondary | ICD-10-CM | POA: Diagnosis not present

## 2018-03-30 DIAGNOSIS — R680 Hypothermia, not associated with low environmental temperature: Secondary | ICD-10-CM | POA: Diagnosis not present

## 2018-03-30 DIAGNOSIS — Z23 Encounter for immunization: Secondary | ICD-10-CM | POA: Diagnosis not present

## 2018-03-30 DIAGNOSIS — D509 Iron deficiency anemia, unspecified: Secondary | ICD-10-CM | POA: Diagnosis not present

## 2018-03-30 DIAGNOSIS — N2581 Secondary hyperparathyroidism of renal origin: Secondary | ICD-10-CM | POA: Diagnosis not present

## 2018-03-30 DIAGNOSIS — R52 Pain, unspecified: Secondary | ICD-10-CM | POA: Diagnosis not present

## 2018-04-01 DIAGNOSIS — N2581 Secondary hyperparathyroidism of renal origin: Secondary | ICD-10-CM | POA: Diagnosis not present

## 2018-04-01 DIAGNOSIS — N186 End stage renal disease: Secondary | ICD-10-CM | POA: Diagnosis not present

## 2018-04-01 DIAGNOSIS — D509 Iron deficiency anemia, unspecified: Secondary | ICD-10-CM | POA: Diagnosis not present

## 2018-04-01 DIAGNOSIS — R52 Pain, unspecified: Secondary | ICD-10-CM | POA: Diagnosis not present

## 2018-04-01 DIAGNOSIS — D631 Anemia in chronic kidney disease: Secondary | ICD-10-CM | POA: Diagnosis not present

## 2018-04-01 DIAGNOSIS — R680 Hypothermia, not associated with low environmental temperature: Secondary | ICD-10-CM | POA: Diagnosis not present

## 2018-04-01 DIAGNOSIS — Z23 Encounter for immunization: Secondary | ICD-10-CM | POA: Diagnosis not present

## 2018-04-04 DIAGNOSIS — D509 Iron deficiency anemia, unspecified: Secondary | ICD-10-CM | POA: Diagnosis not present

## 2018-04-04 DIAGNOSIS — Z23 Encounter for immunization: Secondary | ICD-10-CM | POA: Diagnosis not present

## 2018-04-04 DIAGNOSIS — N2581 Secondary hyperparathyroidism of renal origin: Secondary | ICD-10-CM | POA: Diagnosis not present

## 2018-04-04 DIAGNOSIS — N186 End stage renal disease: Secondary | ICD-10-CM | POA: Diagnosis not present

## 2018-04-04 DIAGNOSIS — R52 Pain, unspecified: Secondary | ICD-10-CM | POA: Diagnosis not present

## 2018-04-04 DIAGNOSIS — R680 Hypothermia, not associated with low environmental temperature: Secondary | ICD-10-CM | POA: Diagnosis not present

## 2018-04-04 DIAGNOSIS — D631 Anemia in chronic kidney disease: Secondary | ICD-10-CM | POA: Diagnosis not present

## 2018-04-06 DIAGNOSIS — N2581 Secondary hyperparathyroidism of renal origin: Secondary | ICD-10-CM | POA: Diagnosis not present

## 2018-04-06 DIAGNOSIS — R52 Pain, unspecified: Secondary | ICD-10-CM | POA: Diagnosis not present

## 2018-04-06 DIAGNOSIS — D631 Anemia in chronic kidney disease: Secondary | ICD-10-CM | POA: Diagnosis not present

## 2018-04-06 DIAGNOSIS — D509 Iron deficiency anemia, unspecified: Secondary | ICD-10-CM | POA: Diagnosis not present

## 2018-04-06 DIAGNOSIS — N186 End stage renal disease: Secondary | ICD-10-CM | POA: Diagnosis not present

## 2018-04-06 DIAGNOSIS — Z23 Encounter for immunization: Secondary | ICD-10-CM | POA: Diagnosis not present

## 2018-04-06 DIAGNOSIS — R680 Hypothermia, not associated with low environmental temperature: Secondary | ICD-10-CM | POA: Diagnosis not present

## 2018-04-08 DIAGNOSIS — N186 End stage renal disease: Secondary | ICD-10-CM | POA: Diagnosis not present

## 2018-04-08 DIAGNOSIS — D509 Iron deficiency anemia, unspecified: Secondary | ICD-10-CM | POA: Diagnosis not present

## 2018-04-08 DIAGNOSIS — Z23 Encounter for immunization: Secondary | ICD-10-CM | POA: Diagnosis not present

## 2018-04-08 DIAGNOSIS — R52 Pain, unspecified: Secondary | ICD-10-CM | POA: Diagnosis not present

## 2018-04-08 DIAGNOSIS — R680 Hypothermia, not associated with low environmental temperature: Secondary | ICD-10-CM | POA: Diagnosis not present

## 2018-04-08 DIAGNOSIS — D631 Anemia in chronic kidney disease: Secondary | ICD-10-CM | POA: Diagnosis not present

## 2018-04-08 DIAGNOSIS — N2581 Secondary hyperparathyroidism of renal origin: Secondary | ICD-10-CM | POA: Diagnosis not present

## 2018-04-11 DIAGNOSIS — D509 Iron deficiency anemia, unspecified: Secondary | ICD-10-CM | POA: Diagnosis not present

## 2018-04-11 DIAGNOSIS — N2581 Secondary hyperparathyroidism of renal origin: Secondary | ICD-10-CM | POA: Diagnosis not present

## 2018-04-11 DIAGNOSIS — Z23 Encounter for immunization: Secondary | ICD-10-CM | POA: Diagnosis not present

## 2018-04-11 DIAGNOSIS — R52 Pain, unspecified: Secondary | ICD-10-CM | POA: Diagnosis not present

## 2018-04-11 DIAGNOSIS — N186 End stage renal disease: Secondary | ICD-10-CM | POA: Diagnosis not present

## 2018-04-11 DIAGNOSIS — R680 Hypothermia, not associated with low environmental temperature: Secondary | ICD-10-CM | POA: Diagnosis not present

## 2018-04-11 DIAGNOSIS — D631 Anemia in chronic kidney disease: Secondary | ICD-10-CM | POA: Diagnosis not present

## 2018-04-13 DIAGNOSIS — N2581 Secondary hyperparathyroidism of renal origin: Secondary | ICD-10-CM | POA: Diagnosis not present

## 2018-04-13 DIAGNOSIS — E039 Hypothyroidism, unspecified: Secondary | ICD-10-CM | POA: Diagnosis not present

## 2018-04-13 DIAGNOSIS — I110 Hypertensive heart disease with heart failure: Secondary | ICD-10-CM | POA: Diagnosis not present

## 2018-04-13 DIAGNOSIS — R0902 Hypoxemia: Secondary | ICD-10-CM | POA: Diagnosis not present

## 2018-04-13 DIAGNOSIS — R52 Pain, unspecified: Secondary | ICD-10-CM | POA: Diagnosis not present

## 2018-04-13 DIAGNOSIS — I509 Heart failure, unspecified: Secondary | ICD-10-CM | POA: Diagnosis not present

## 2018-04-13 DIAGNOSIS — I480 Paroxysmal atrial fibrillation: Secondary | ICD-10-CM | POA: Diagnosis not present

## 2018-04-13 DIAGNOSIS — R680 Hypothermia, not associated with low environmental temperature: Secondary | ICD-10-CM | POA: Diagnosis not present

## 2018-04-13 DIAGNOSIS — Z992 Dependence on renal dialysis: Secondary | ICD-10-CM | POA: Diagnosis not present

## 2018-04-13 DIAGNOSIS — D631 Anemia in chronic kidney disease: Secondary | ICD-10-CM | POA: Diagnosis not present

## 2018-04-13 DIAGNOSIS — Z23 Encounter for immunization: Secondary | ICD-10-CM | POA: Diagnosis not present

## 2018-04-13 DIAGNOSIS — D509 Iron deficiency anemia, unspecified: Secondary | ICD-10-CM | POA: Diagnosis not present

## 2018-04-13 DIAGNOSIS — M069 Rheumatoid arthritis, unspecified: Secondary | ICD-10-CM | POA: Diagnosis not present

## 2018-04-13 DIAGNOSIS — I5043 Acute on chronic combined systolic (congestive) and diastolic (congestive) heart failure: Secondary | ICD-10-CM | POA: Diagnosis not present

## 2018-04-13 DIAGNOSIS — R51 Headache: Secondary | ICD-10-CM | POA: Diagnosis not present

## 2018-04-13 DIAGNOSIS — I120 Hypertensive chronic kidney disease with stage 5 chronic kidney disease or end stage renal disease: Secondary | ICD-10-CM | POA: Diagnosis not present

## 2018-04-13 DIAGNOSIS — N186 End stage renal disease: Secondary | ICD-10-CM | POA: Diagnosis not present

## 2018-04-13 DIAGNOSIS — E162 Hypoglycemia, unspecified: Secondary | ICD-10-CM | POA: Diagnosis not present

## 2018-04-13 DIAGNOSIS — J90 Pleural effusion, not elsewhere classified: Secondary | ICD-10-CM | POA: Diagnosis not present

## 2018-04-13 DIAGNOSIS — Z79899 Other long term (current) drug therapy: Secondary | ICD-10-CM | POA: Diagnosis not present

## 2018-04-14 DIAGNOSIS — D638 Anemia in other chronic diseases classified elsewhere: Secondary | ICD-10-CM | POA: Diagnosis not present

## 2018-04-14 DIAGNOSIS — J449 Chronic obstructive pulmonary disease, unspecified: Secondary | ICD-10-CM | POA: Diagnosis not present

## 2018-04-14 DIAGNOSIS — R918 Other nonspecific abnormal finding of lung field: Secondary | ICD-10-CM | POA: Diagnosis not present

## 2018-04-14 DIAGNOSIS — R040 Epistaxis: Secondary | ICD-10-CM | POA: Diagnosis not present

## 2018-04-14 DIAGNOSIS — J90 Pleural effusion, not elsewhere classified: Secondary | ICD-10-CM | POA: Diagnosis not present

## 2018-04-14 DIAGNOSIS — L0390 Cellulitis, unspecified: Secondary | ICD-10-CM | POA: Diagnosis not present

## 2018-04-14 DIAGNOSIS — Z452 Encounter for adjustment and management of vascular access device: Secondary | ICD-10-CM | POA: Diagnosis not present

## 2018-04-14 DIAGNOSIS — I959 Hypotension, unspecified: Secondary | ICD-10-CM | POA: Diagnosis not present

## 2018-04-14 DIAGNOSIS — D62 Acute posthemorrhagic anemia: Secondary | ICD-10-CM | POA: Diagnosis not present

## 2018-04-14 DIAGNOSIS — K828 Other specified diseases of gallbladder: Secondary | ICD-10-CM | POA: Diagnosis not present

## 2018-04-14 DIAGNOSIS — R0989 Other specified symptoms and signs involving the circulatory and respiratory systems: Secondary | ICD-10-CM | POA: Diagnosis not present

## 2018-04-14 DIAGNOSIS — E876 Hypokalemia: Secondary | ICD-10-CM | POA: Diagnosis not present

## 2018-04-14 DIAGNOSIS — I4892 Unspecified atrial flutter: Secondary | ICD-10-CM | POA: Diagnosis not present

## 2018-04-14 DIAGNOSIS — I509 Heart failure, unspecified: Secondary | ICD-10-CM | POA: Diagnosis not present

## 2018-04-14 DIAGNOSIS — I120 Hypertensive chronic kidney disease with stage 5 chronic kidney disease or end stage renal disease: Secondary | ICD-10-CM | POA: Diagnosis not present

## 2018-04-14 DIAGNOSIS — Z66 Do not resuscitate: Secondary | ICD-10-CM | POA: Diagnosis not present

## 2018-04-14 DIAGNOSIS — Z96643 Presence of artificial hip joint, bilateral: Secondary | ICD-10-CM | POA: Diagnosis not present

## 2018-04-14 DIAGNOSIS — I483 Typical atrial flutter: Secondary | ICD-10-CM | POA: Diagnosis not present

## 2018-04-14 DIAGNOSIS — A419 Sepsis, unspecified organism: Secondary | ICD-10-CM | POA: Diagnosis not present

## 2018-04-14 DIAGNOSIS — T17320A Food in larynx causing asphyxiation, initial encounter: Secondary | ICD-10-CM | POA: Diagnosis not present

## 2018-04-14 DIAGNOSIS — R1310 Dysphagia, unspecified: Secondary | ICD-10-CM | POA: Diagnosis not present

## 2018-04-14 DIAGNOSIS — Z515 Encounter for palliative care: Secondary | ICD-10-CM | POA: Diagnosis not present

## 2018-04-14 DIAGNOSIS — R05 Cough: Secondary | ICD-10-CM | POA: Diagnosis not present

## 2018-04-14 DIAGNOSIS — E8770 Fluid overload, unspecified: Secondary | ICD-10-CM | POA: Diagnosis not present

## 2018-04-14 DIAGNOSIS — Z992 Dependence on renal dialysis: Secondary | ICD-10-CM | POA: Diagnosis not present

## 2018-04-14 DIAGNOSIS — R0902 Hypoxemia: Secondary | ICD-10-CM | POA: Diagnosis not present

## 2018-04-14 DIAGNOSIS — N19 Unspecified kidney failure: Secondary | ICD-10-CM | POA: Diagnosis not present

## 2018-04-14 DIAGNOSIS — I517 Cardiomegaly: Secondary | ICD-10-CM | POA: Diagnosis not present

## 2018-04-14 DIAGNOSIS — T45511D Poisoning by anticoagulants, accidental (unintentional), subsequent encounter: Secondary | ICD-10-CM | POA: Diagnosis not present

## 2018-04-14 DIAGNOSIS — Z0000 Encounter for general adult medical examination without abnormal findings: Secondary | ICD-10-CM | POA: Diagnosis not present

## 2018-04-14 DIAGNOSIS — J159 Unspecified bacterial pneumonia: Secondary | ICD-10-CM | POA: Diagnosis not present

## 2018-04-14 DIAGNOSIS — R51 Headache: Secondary | ICD-10-CM | POA: Diagnosis not present

## 2018-04-14 DIAGNOSIS — J811 Chronic pulmonary edema: Secondary | ICD-10-CM | POA: Diagnosis not present

## 2018-04-14 DIAGNOSIS — R0602 Shortness of breath: Secondary | ICD-10-CM | POA: Diagnosis not present

## 2018-04-14 DIAGNOSIS — N281 Cyst of kidney, acquired: Secondary | ICD-10-CM | POA: Diagnosis not present

## 2018-04-14 DIAGNOSIS — R188 Other ascites: Secondary | ICD-10-CM | POA: Diagnosis not present

## 2018-04-14 DIAGNOSIS — N186 End stage renal disease: Secondary | ICD-10-CM | POA: Diagnosis not present

## 2018-04-14 DIAGNOSIS — E162 Hypoglycemia, unspecified: Secondary | ICD-10-CM | POA: Diagnosis not present

## 2018-04-14 DIAGNOSIS — M069 Rheumatoid arthritis, unspecified: Secondary | ICD-10-CM | POA: Diagnosis not present

## 2018-04-14 DIAGNOSIS — J9601 Acute respiratory failure with hypoxia: Secondary | ICD-10-CM | POA: Diagnosis not present

## 2018-04-14 DIAGNOSIS — Z79899 Other long term (current) drug therapy: Secondary | ICD-10-CM | POA: Diagnosis not present

## 2018-04-14 DIAGNOSIS — Z4901 Encounter for fitting and adjustment of extracorporeal dialysis catheter: Secondary | ICD-10-CM | POA: Diagnosis not present

## 2018-04-14 DIAGNOSIS — N028 Recurrent and persistent hematuria with other morphologic changes: Secondary | ICD-10-CM | POA: Diagnosis not present

## 2018-04-14 DIAGNOSIS — Z7952 Long term (current) use of systemic steroids: Secondary | ICD-10-CM | POA: Diagnosis not present

## 2018-04-14 DIAGNOSIS — T45511A Poisoning by anticoagulants, accidental (unintentional), initial encounter: Secondary | ICD-10-CM | POA: Diagnosis not present

## 2018-04-14 DIAGNOSIS — R6521 Severe sepsis with septic shock: Secondary | ICD-10-CM | POA: Diagnosis not present

## 2018-04-14 DIAGNOSIS — G9341 Metabolic encephalopathy: Secondary | ICD-10-CM | POA: Diagnosis not present

## 2018-04-14 DIAGNOSIS — I480 Paroxysmal atrial fibrillation: Secondary | ICD-10-CM | POA: Diagnosis not present

## 2018-04-14 DIAGNOSIS — D696 Thrombocytopenia, unspecified: Secondary | ICD-10-CM | POA: Diagnosis not present

## 2018-04-14 DIAGNOSIS — N261 Atrophy of kidney (terminal): Secondary | ICD-10-CM | POA: Diagnosis not present

## 2018-04-24 DIAGNOSIS — N028 Recurrent and persistent hematuria with other morphologic changes: Secondary | ICD-10-CM | POA: Diagnosis not present

## 2018-04-24 DIAGNOSIS — Z992 Dependence on renal dialysis: Secondary | ICD-10-CM | POA: Diagnosis not present

## 2018-04-24 DIAGNOSIS — N186 End stage renal disease: Secondary | ICD-10-CM | POA: Diagnosis not present

## 2018-04-30 DEATH — deceased

## 2018-09-21 IMAGING — DX DG CHEST 2V
2 series · 2 of 2 positions shown · non-contrast
Comparison: 03/03/2018

CLINICAL DATA: Weakness, shortness of Breath

EXAM:
CHEST - 2 VIEW

[chest lat]
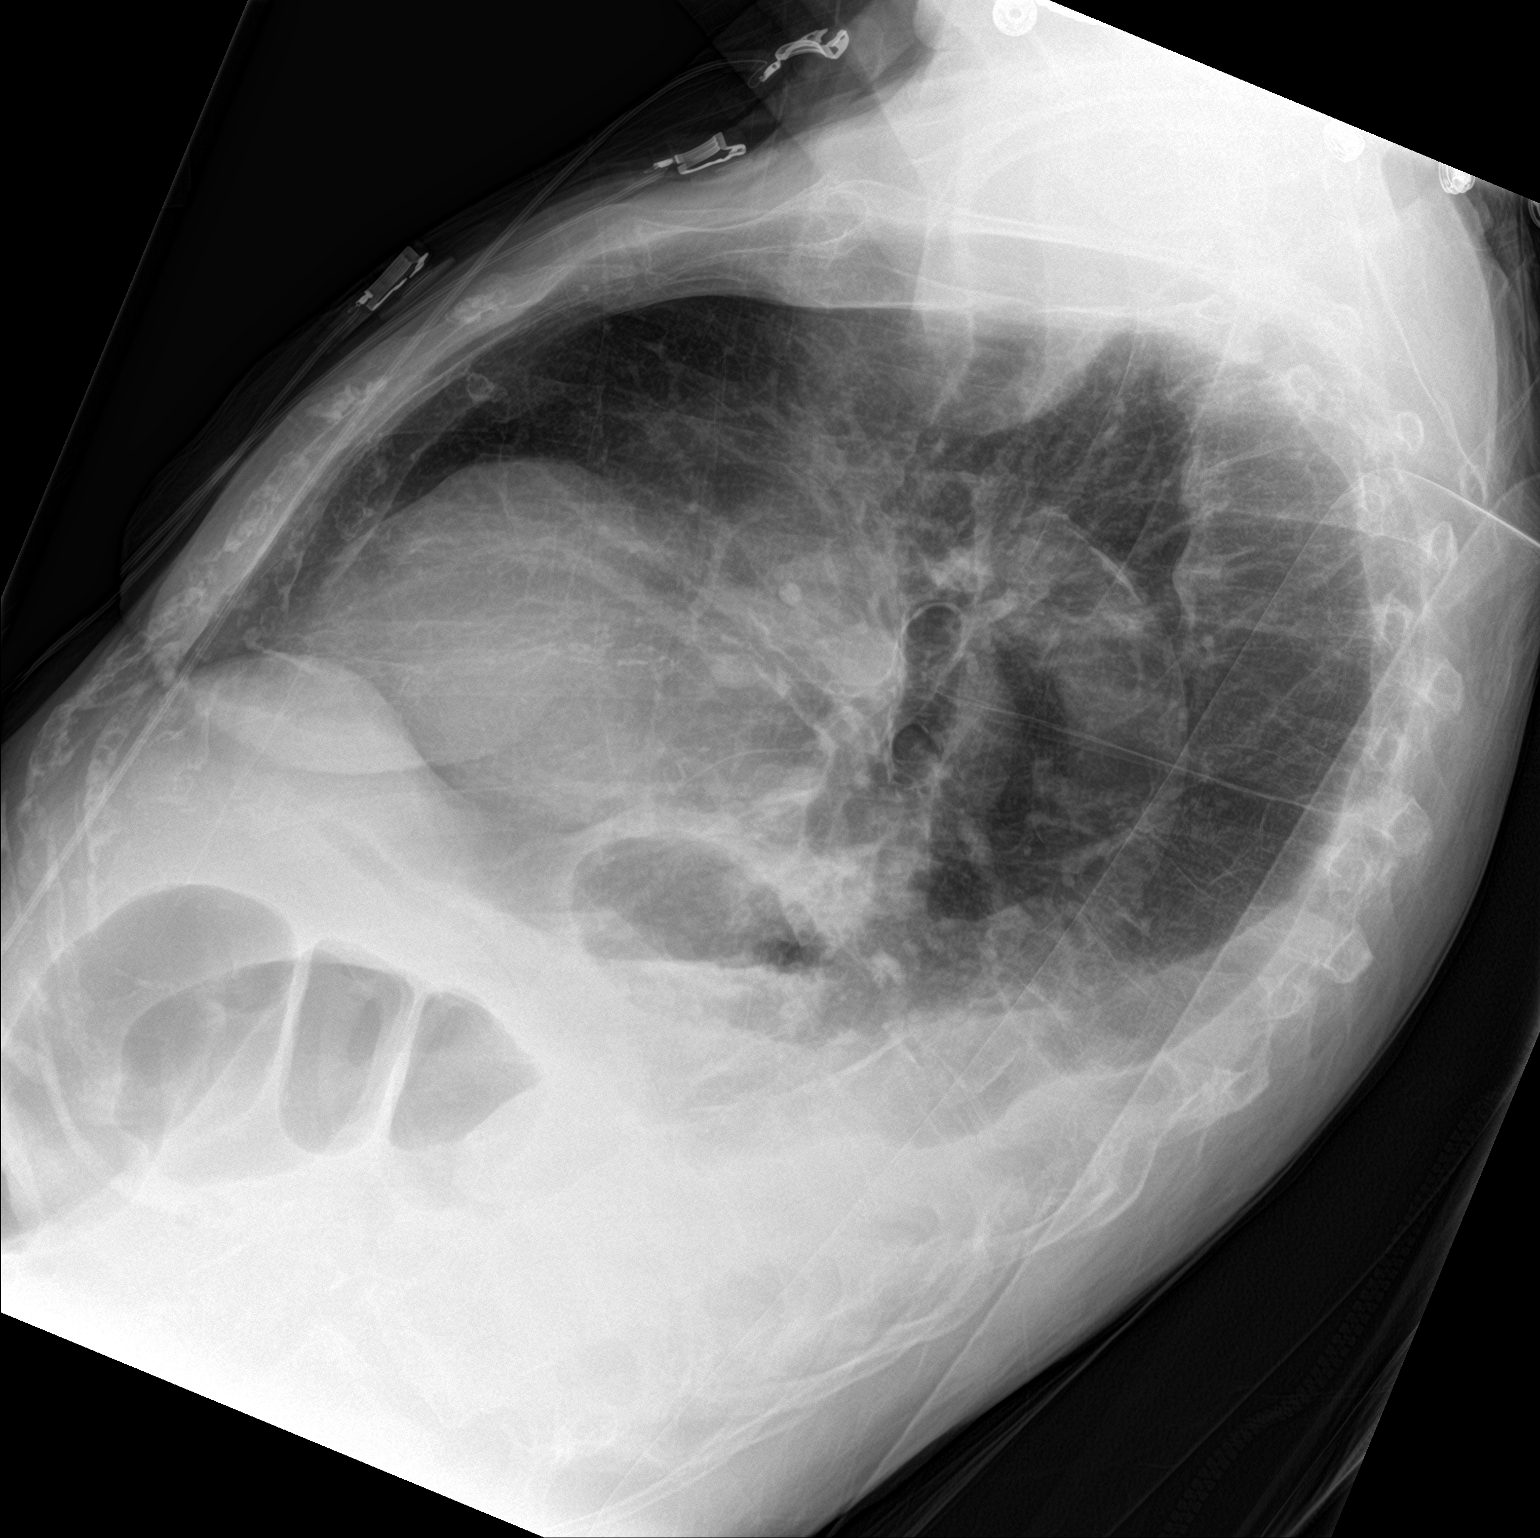

[chest ap]
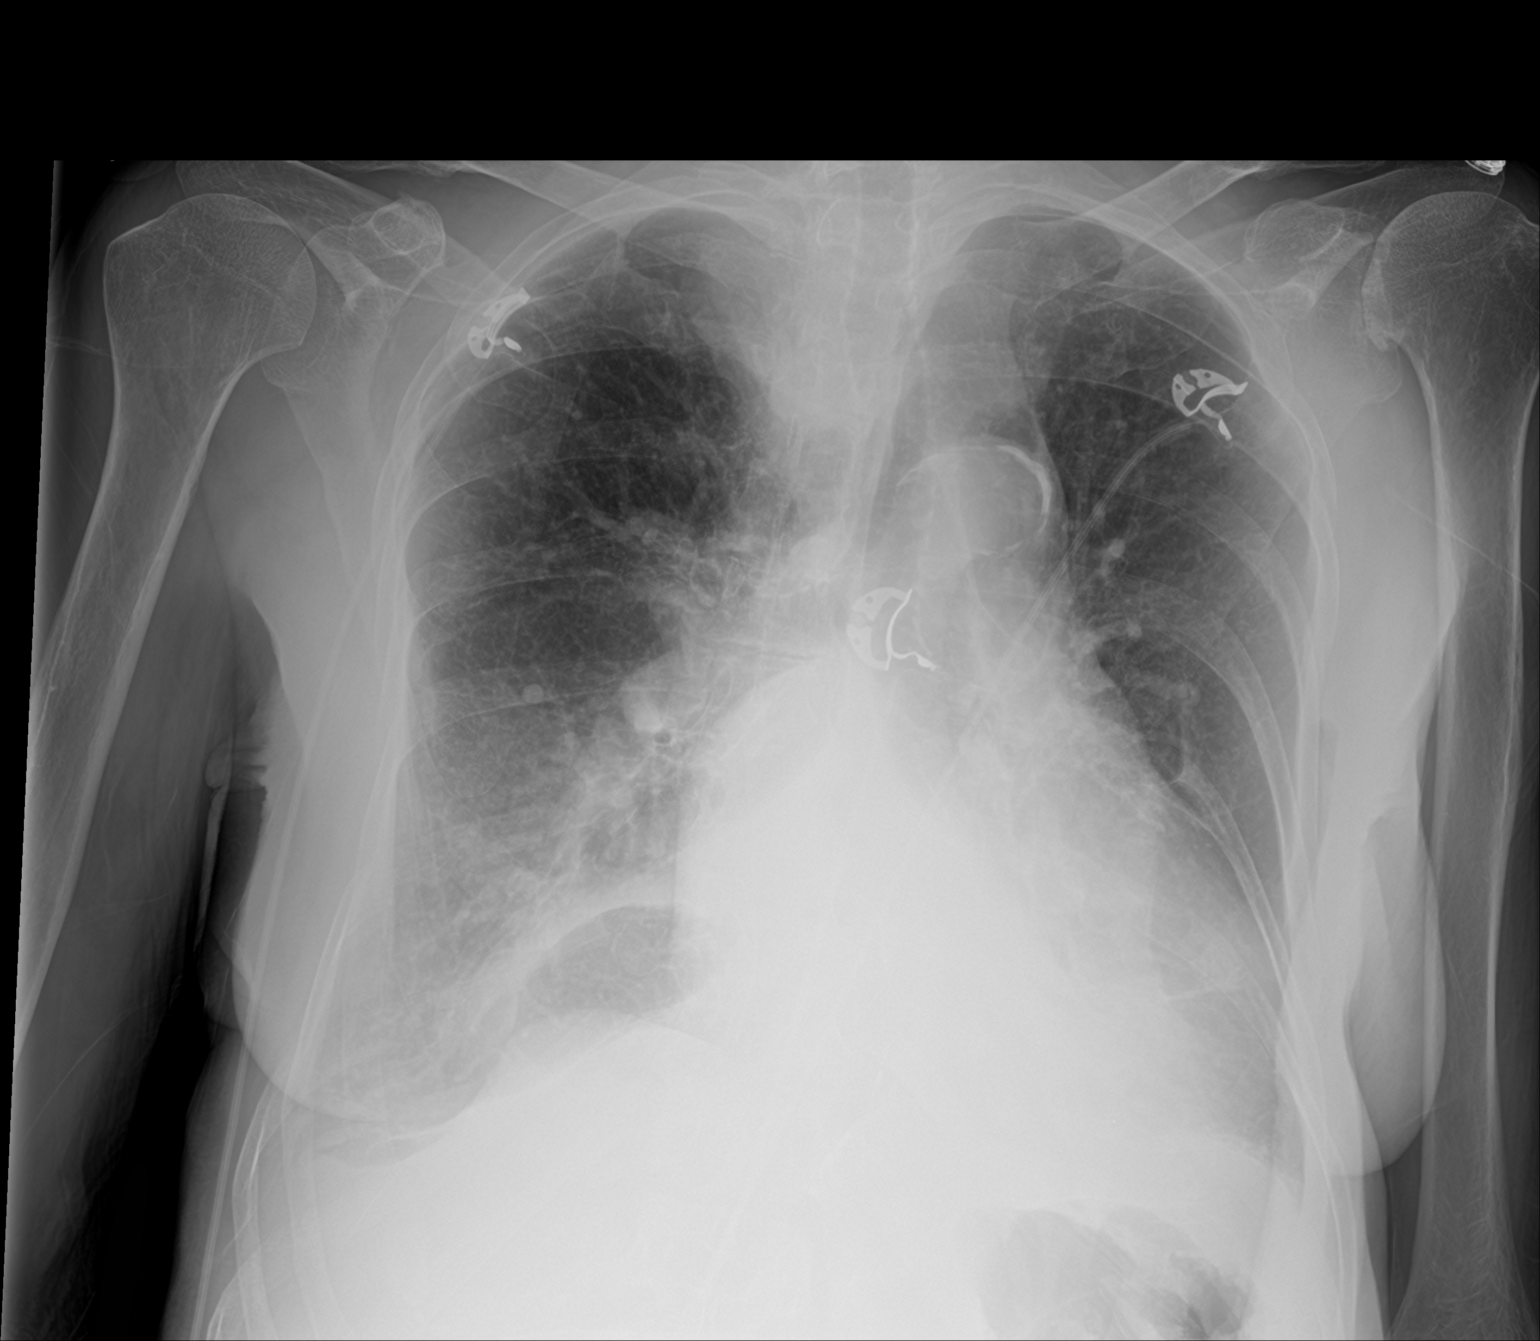

[2 of 2 positions shown; findings below may reference images not displayed]

FINDINGS: Cardiomegaly. Layering bilateral effusions. Vascular congestion and
bilateral perihilar and lower lobe airspace opacities, favor
edema/CHF, worsening since prior study. Large hiatal hernia. No
acute bony abnormality.
IMPRESSION: Cardiomegaly. Worsening perihilar and lower lobe airspace opacities,
favor edema. Layering bilateral effusions.
